# Patient Record
Sex: Female | Born: 1970
Health system: Southern US, Community
[De-identification: ages and names within clinical notes are randomized; demographics above are authoritative.]

## PROBLEM LIST (undated history)

## (undated) DIAGNOSIS — R609 Edema, unspecified: Secondary | ICD-10-CM

## (undated) DIAGNOSIS — M549 Dorsalgia, unspecified: Secondary | ICD-10-CM

## (undated) DIAGNOSIS — M2550 Pain in unspecified joint: Secondary | ICD-10-CM

## (undated) DIAGNOSIS — M779 Enthesopathy, unspecified: Secondary | ICD-10-CM

## (undated) DIAGNOSIS — E7800 Pure hypercholesterolemia, unspecified: Secondary | ICD-10-CM

## (undated) DIAGNOSIS — Z8739 Personal history of other diseases of the musculoskeletal system and connective tissue: Secondary | ICD-10-CM

## (undated) DIAGNOSIS — R7303 Prediabetes: Secondary | ICD-10-CM

## (undated) DIAGNOSIS — T7840XA Allergy, unspecified, initial encounter: Secondary | ICD-10-CM

## (undated) DIAGNOSIS — M1990 Unspecified osteoarthritis, unspecified site: Secondary | ICD-10-CM

## (undated) DIAGNOSIS — R928 Other abnormal and inconclusive findings on diagnostic imaging of breast: Secondary | ICD-10-CM

## (undated) DIAGNOSIS — I10 Essential (primary) hypertension: Secondary | ICD-10-CM

## (undated) DIAGNOSIS — H409 Unspecified glaucoma: Secondary | ICD-10-CM

## (undated) DIAGNOSIS — T8859XA Other complications of anesthesia, initial encounter: Secondary | ICD-10-CM

## (undated) DIAGNOSIS — R12 Heartburn: Secondary | ICD-10-CM

## (undated) DIAGNOSIS — I499 Cardiac arrhythmia, unspecified: Secondary | ICD-10-CM

## (undated) DIAGNOSIS — K219 Gastro-esophageal reflux disease without esophagitis: Secondary | ICD-10-CM

## (undated) DIAGNOSIS — G4730 Sleep apnea, unspecified: Secondary | ICD-10-CM

## (undated) HISTORY — PX: ABDOMINAL HYSTERECTOMY: SHX81

## (undated) HISTORY — DX: Prediabetes: R73.03

## (undated) HISTORY — DX: Allergy, unspecified, initial encounter: T78.40XA

## (undated) HISTORY — DX: Pain in unspecified joint: M25.50

## (undated) HISTORY — DX: Other abnormal and inconclusive findings on diagnostic imaging of breast: R92.8

## (undated) HISTORY — DX: Edema, unspecified: R60.9

## (undated) HISTORY — PX: CARPAL TUNNEL RELEASE: SHX101

## (undated) HISTORY — PX: CYST REMOVAL TRUNK: SHX6283

## (undated) HISTORY — PX: ECTOPIC PREGNANCY SURGERY: SHX613

## (undated) HISTORY — DX: Gastro-esophageal reflux disease without esophagitis: K21.9

## (undated) HISTORY — DX: Dorsalgia, unspecified: M54.9

## (undated) HISTORY — DX: Personal history of other diseases of the musculoskeletal system and connective tissue: Z87.39

## (undated) HISTORY — DX: Heartburn: R12

## (undated) HISTORY — DX: Unspecified glaucoma: H40.9

## (undated) HISTORY — DX: Sleep apnea, unspecified: G47.30

## (undated) HISTORY — DX: Unspecified osteoarthritis, unspecified site: M19.90

## (undated) HISTORY — PX: TOTAL ABDOMINAL HYSTERECTOMY: SHX209

## (undated) HISTORY — DX: Pure hypercholesterolemia, unspecified: E78.00

## (undated) HISTORY — PX: WISDOM TOOTH EXTRACTION: SHX21

---

## 2011-09-19 DIAGNOSIS — R600 Localized edema: Secondary | ICD-10-CM | POA: Insufficient documentation

## 2012-01-12 DIAGNOSIS — D219 Benign neoplasm of connective and other soft tissue, unspecified: Secondary | ICD-10-CM | POA: Insufficient documentation

## 2015-12-18 DIAGNOSIS — G43009 Migraine without aura, not intractable, without status migrainosus: Secondary | ICD-10-CM | POA: Insufficient documentation

## 2016-10-15 DIAGNOSIS — N6310 Unspecified lump in the right breast, unspecified quadrant: Secondary | ICD-10-CM | POA: Insufficient documentation

## 2018-06-06 DIAGNOSIS — M2391 Unspecified internal derangement of right knee: Secondary | ICD-10-CM | POA: Insufficient documentation

## 2018-07-19 DIAGNOSIS — D235 Other benign neoplasm of skin of trunk: Secondary | ICD-10-CM | POA: Insufficient documentation

## 2019-10-29 ENCOUNTER — Emergency Department
Admission: EM | Admit: 2019-10-29 | Discharge: 2019-10-29 | Disposition: A | Discharge status: Home / Self Care | Payer: No Typology Code available for payment source | Source: Home / Self Care | Attending: Family Medicine | Admitting: Family Medicine

## 2019-10-29 ENCOUNTER — Other Ambulatory Visit: Payer: Self-pay

## 2019-10-29 ENCOUNTER — Ambulatory Visit: Payer: Self-pay | Admitting: Family Medicine

## 2019-10-29 DIAGNOSIS — I10 Essential (primary) hypertension: Secondary | ICD-10-CM

## 2019-10-29 HISTORY — DX: Enthesopathy, unspecified: M77.9

## 2019-10-29 HISTORY — DX: Essential (primary) hypertension: I10

## 2019-10-29 LAB — POCT CBC W AUTO DIFF (K'VILLE URGENT CARE)

## 2019-10-29 LAB — POCT URINALYSIS DIP (MANUAL ENTRY)
Bilirubin, UA: NEGATIVE
Blood, UA: NEGATIVE
Glucose, UA: NEGATIVE mg/dL
Ketones, POC UA: NEGATIVE mg/dL
Leukocytes, UA: NEGATIVE
Nitrite, UA: NEGATIVE
Spec Grav, UA: >=1.03 — AB (ref 1.010–1.025)
Urobilinogen, UA: 0.2 E.U./dL
pH, UA: 6 (ref 5.0–8.0)

## 2019-10-29 MED ORDER — LOSARTAN POTASSIUM-HCTZ 100-25 MG PO TABS: 1.0000 | ORAL_TABLET | Freq: Every day | ORAL | 1 refills | Status: DC

## 2019-10-29 NOTE — ED Triage Notes (Signed)
Patient presents to Urgent Care with complaints of needing her blood pressure checked and needing to be evaluated for potential need for BP medication since she was 10 minutes late for her PCP appointment and she was only allowed a 9 minute grace period. Patient reports she takes two BP medications already, she does feel like her BP is high, last home reading was 156/98.

## 2019-10-29 NOTE — ED Provider Notes (Signed)
[facility]    CSN: [id number] Arrival date & time: 10/29/19  1116      History   Chief Complaint Chief Complaint  Patient presents with  . Hypertension    HPI Amy Diaz is a 49 y.o. female.   Patient states that she has hypertension, but has run out of her medication, and arrived to her PCP followup appointment too late to be seen.  She states that she has been taking Hyzaar on alternate days because she does not have enough tabs, but has plenty of Toprol which she continues daily.  She reports that her BP had been controlled when she took both meds.  She reports no adverse effects from her meds.  The history is provided by the patient.    Past Medical History:  Diagnosis Date  . Enthesopathy   . Hypertension     There are no problems to display for this patient.   History reviewed. No pertinent surgical history.  OB History   No obstetric history on file.      Home Medications    Prior to Admission medications   Medication Sig Start Date End Date Taking? Authorizing Provider  metoprolol succinate (TOPROL-XL) 25 MG 24 hr tablet Take 25 mg by mouth daily.   Yes [provider]  losartan-hydrochlorothiazide (HYZAAR) 100-25 MG tablet Take 1 tablet by mouth daily. 10/29/19   Lattie Haw, MD    Family History Family History  Problem Relation Age of Onset  . Hypertension Mother   . Hypertension Father   . Cancer Father     Social History Social History   Tobacco Use  . Smoking status: Never Smoker  . Smokeless tobacco: Never Used  Substance Use Topics  . Alcohol use: Not Currently  . Drug use: Not on file     Allergies   Imitrex [sumatriptan] and Lisinopril   Review of Systems Review of Systems  Constitutional: Negative for appetite change, chills, diaphoresis, fatigue and fever.  HENT: Negative.   Eyes: Negative.   Respiratory: Negative.   Cardiovascular: Negative for chest pain, palpitations and leg swelling.    Gastrointestinal: Negative.   Genitourinary: Negative.   Musculoskeletal: Negative.   Skin: Negative.   Neurological: Negative.      Physical Exam Triage Vital Signs ED Triage Vitals [10/29/19 1142]  Enc Vitals Group     BP (!) 174/94     Pulse Rate 69     Resp 16     Temp 98.4 F (36.9 C)     Temp Source Oral     SpO2 99 %     Weight      Height      Head Circumference      Peak Flow      Pain Score      Pain Loc      Pain Edu?      Excl. in GC?    No data found.  Updated Vital Signs BP (!) 174/94 (BP Location: Right Arm)   Pulse 69   Temp 98.4 F (36.9 C) (Oral)   Resp 16   SpO2 99%   Visual Acuity Right Eye Distance:   Left Eye Distance:   Bilateral Distance:    Right Eye Near:   Left Eye Near:    Bilateral Near:     Physical Exam Nursing notes and Vital Signs reviewed. Appearance:  Patient appears stated age, and in no acute distress.    Eyes:  Pupils are equal,  round, and reactive to light and accomodation.  Extraocular movement is intact.  Conjunctivae are not inflamed.  Fundi benign Pharynx:  Normal; moist mucous membranes  Neck:  Supple.  No adenopathy or thyromegaly.  Carotids have normal upstrokes without bruits. Lungs:  Clear to auscultation.  Breath sounds are equal.  Moving air well. Heart:  Regular rate and rhythm without murmurs, rubs, or gallops.  Abdomen:  Nontender without masses or hepatosplenomegaly.  Bowel sounds are present.  No CVA or flank tenderness.  Extremities:  No edema.  Skin:  No rash present.     UC Treatments / Results  Labs (all labs ordered are listed, but only abnormal results are displayed) Labs Reviewed  COMPLETE METABOLIC PANEL WITH GFR  LIPID PANEL  POCT CBC W AUTO DIFF ([facility])  POCT URINALYSIS DIP (MANUAL ENTRY)    EKG   Radiology No results found.  Procedures Procedures (including critical care time)  Medications Ordered in UC Medications - No data to display  Initial Impression  / Assessment and Plan / UC Course  I have reviewed the triage vital signs and the nursing notes.  Pertinent labs & imaging results that were available during my care of the patient were reviewed by me and considered in my medical decision making (see chart for details).    Resume daily Hyzaar.  Check CMP, CBC, U/A, lipid panel Followup with Family Doctor as scheduled.   Final Clinical Impressions(s) / UC Diagnoses   Final diagnoses:  Essential hypertension     Discharge Instructions     Continue Toprol-XL 25mg  as prescribed.  Please monitor your blood pressure several times weekly, at different times of the day, record on a calendar with times of measurement and take this record to your Family Doctor.     ED Prescriptions    Medication Sig Dispense Auth. Provider   losartan-hydrochlorothiazide (HYZAAR) 100-25 MG tablet Take 1 tablet by mouth daily. 15 tablet Kandra Nicolas, MD        Kandra Nicolas, MD 10/31/19 [phone]

## 2019-10-29 NOTE — Discharge Instructions (Addendum)
Continue Toprol-XL 25mg  as prescribed.  Please monitor your blood pressure several times weekly, at different times of the day, record on a calendar with times of measurement and take this record to your Family Doctor.

## 2019-10-30 LAB — HEMOGLOBIN A1C
Hgb A1c MFr Bld: 5.8 % of total Hgb — ABNORMAL HIGH (ref <5.7)
Mean Plasma Glucose: 120 (calc)
eAG (mmol/L): 6.6 (calc)

## 2019-10-30 LAB — EXTRA SPECIMEN

## 2019-11-03 ENCOUNTER — Other Ambulatory Visit: Payer: Self-pay

## 2019-11-03 ENCOUNTER — Encounter: Payer: Self-pay | Admitting: Medical-Surgical

## 2019-11-03 ENCOUNTER — Other Ambulatory Visit (HOSPITAL_COMMUNITY): Payer: Self-pay | Admitting: Medical-Surgical

## 2019-11-03 ENCOUNTER — Ambulatory Visit (INDEPENDENT_AMBULATORY_CARE_PROVIDER_SITE_OTHER): Payer: No Typology Code available for payment source | Admitting: Medical-Surgical

## 2019-11-03 VITALS — BP 146/78 | HR 66 | Temp 97.8°F | Ht 63.0 in | Wt 247.4 lb

## 2019-11-03 DIAGNOSIS — I10 Essential (primary) hypertension: Secondary | ICD-10-CM | POA: Insufficient documentation

## 2019-11-03 DIAGNOSIS — Z1231 Encounter for screening mammogram for malignant neoplasm of breast: Secondary | ICD-10-CM | POA: Diagnosis not present

## 2019-11-03 DIAGNOSIS — R635 Abnormal weight gain: Secondary | ICD-10-CM

## 2019-11-03 DIAGNOSIS — G4700 Insomnia, unspecified: Secondary | ICD-10-CM

## 2019-11-03 DIAGNOSIS — W19XXXS Unspecified fall, sequela: Secondary | ICD-10-CM

## 2019-11-03 DIAGNOSIS — W19XXXA Unspecified fall, initial encounter: Secondary | ICD-10-CM | POA: Insufficient documentation

## 2019-11-03 DIAGNOSIS — R7303 Prediabetes: Secondary | ICD-10-CM

## 2019-11-03 MED ORDER — OZEMPIC (0.25 OR 0.5 MG/DOSE) 2 MG/1.5ML ~~LOC~~ SOPN: PEN_INJECTOR | SUBCUTANEOUS | 1 refills | Status: DC

## 2019-11-03 MED ORDER — LOSARTAN POTASSIUM-HCTZ 100-25 MG PO TABS: 1.0000 | ORAL_TABLET | Freq: Every day | ORAL | 3 refills | Status: DC

## 2019-11-03 MED ORDER — TRAZODONE HCL 50 MG PO TABS: 25.0000 mg | ORAL_TABLET | Freq: Every evening | ORAL | 3 refills | Status: DC | PRN

## 2019-11-03 MED ORDER — METOPROLOL SUCCINATE ER 25 MG PO TB24: 25.0000 mg | ORAL_TABLET | Freq: Every day | ORAL | 3 refills | Status: DC

## 2019-11-03 MED FILL — traZODone HCL 50 MG TABS: 50 | 30 days supply | Qty: 30 | Fill #0

## 2019-11-03 MED FILL — METOPROLOL SUCCINATE ER 25: 25 | 90 days supply | Qty: 90 | Fill #0

## 2019-11-03 NOTE — Progress Notes (Signed)
New Patient Office Visit  Subjective:  Patient ID: Amy Diaz, female    DOB: 11/10/1970  Age: 49 y.o. MRN: [id number]  CC:  Chief Complaint  Patient presents with  . Establish Care  . Hypertension  . Weight Gain  . Fall    HPI Amy Diaz presents to establish care.  Would like to discuss hypertension, weight gain, and recent falls.   Hypertension-does check blood pressures at home but recently ran out of her medication.  Was seen at urgent care and provided 15-day refills to allow her to establish care with a new provider.  Readings at home have been 140-174/80s over the past week.  Reports still having small amounts of sodium to her food but trying to be careful about what she eats.  Not always checking food packages to evaluate sodium content.  Taking losartan-HCTZ 100-25 mg and Toprol-XL 25 mg daily.  Tolerating medications well without side effects.  Denies chest pain, palpitations, shortness of breath, lower extremity edema.  Endorses occasional headaches, especially when her blood pressure is elevated.  Weight gain-reports in November 2020 she was 225 pounds.  Has been previously seen by weight management and did phentermine with Topamax.  Her insurance did not cover the combination pill and she reports that the 2 medications separately did not seem to have the same effect.  She had successfully lost to 207 pounds but has gained some back over the year.  Today she is weighing in at 247 pounds and would like to get started on weight loss medications again.  She is reported some shortness of breath on exertion and admits that she is not exercising.  Falls-reports that she has had approximately 4 falls in the last 12 months, usually related to inclement weather.  These falls have left her with bilateral knee pain and lower back pain.  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  Taking Aleve  as needed which does provide some relief.  Insomnia-reports significant difficulty for many years with staying asleep at night.  Averages approximately 4 hours of sleep, nonrestorative.  Falls asleep without difficulty but when she wakes has trouble getting back to sleep.  Has tried Lunesta, melatonin, OTC sleep aids without relief.  Breast cancer screening-history of abnormal mammogram with 2 clips present from previous biopsies.  Overdue for next mammogram.  Would like to have that ordered today. Past Medical History:  Diagnosis Date  . Abnormal mammogram   . Enthesopathy   . H/O degenerative disc disease   . Hypertension     Past Surgical History:  Procedure Laterality Date  . ABDOMINAL HYSTERECTOMY    . CARPAL TUNNEL RELEASE    . CESAREAN SECTION    . CYST REMOVAL TRUNK    . ECTOPIC PREGNANCY SURGERY      Family History  Problem Relation Age of Onset  . Hypertension Mother   . Hypertension Father   . Prostate cancer Father     Social History   Socioeconomic History  . Marital status: Married    Spouse name: Not on file  . Number of children: Not on file  . Years of education: Not on file  . Highest education level: Not on file  Occupational History  . Not on file  Tobacco Use  . Smoking status: Never Smoker  . Smokeless tobacco: Never Used  Substance and Sexual Activity  . Alcohol use: Yes  Comment: 1 drink/month  . Drug use: Never  . Sexual activity: Yes    Birth control/protection: Surgical  Other Topics Concern  . Not on file  Social History Narrative  . Not on file   Social Determinants of Health   Financial Resource Strain:   . Difficulty of Paying Living Expenses:   Food Insecurity:   . Worried About Charity fundraiser in the Last Year:   . Arboriculturist in the Last Year:   Transportation Needs:   . Film/video editor (Medical):   Marland Kitchen Lack of Transportation (Non-Medical):   Physical Activity:   . Days of Exercise per Week:   . Minutes  of Exercise per Session:   Stress:   . Feeling of Stress :   Social Connections:   . Frequency of Communication with Friends and Family:   . Frequency of Social Gatherings with Friends and Family:   . Attends Religious Services:   . Active Member of Clubs or Organizations:   . Attends Archivist Meetings:   Marland Kitchen Marital Status:   Intimate Partner Violence:   . Fear of Current or Ex-Partner:   . Emotionally Abused:   Marland Kitchen Physically Abused:   . Sexually Abused:     ROS Review of Systems  Constitutional: Negative for chills, fatigue and fever.  Eyes: Negative for visual disturbance.  Respiratory: Positive for shortness of breath (With exertion). Negative for cough and chest tightness.   Cardiovascular: Negative for chest pain, palpitations and leg swelling.  Gastrointestinal: Negative for abdominal pain, blood in stool, constipation and diarrhea.  Musculoskeletal: Positive for arthralgias (Bilateral knees) and back pain.  Neurological: Positive for headaches. Negative for dizziness and light-headedness.  Psychiatric/Behavioral: Positive for sleep disturbance.    Objective:   Today's Vitals: BP (!) 146/78   Pulse 66   Temp 97.8 F (36.6 C) (Oral)   Ht 5\' 3"  (1.6 m)   Wt 247 lb 6.4 oz (112.2 kg)   SpO2 100%   BMI 43.82 kg/m   Physical Exam Vitals reviewed.  Constitutional:      General: She is not in acute distress.    Appearance: Normal appearance.  HENT:     Head: Normocephalic and atraumatic.  Cardiovascular:     Rate and Rhythm: Normal rate and regular rhythm.     Pulses: Normal pulses.     Heart sounds: Normal heart sounds. No murmur. No friction rub. No gallop.   Pulmonary:     Effort: Pulmonary effort is normal. No respiratory distress.     Breath sounds: Normal breath sounds. No wheezing.  Skin:    General: Skin is warm and dry.  Neurological:     Mental Status: She is alert and oriented to person, place, and time.  Psychiatric:        Mood and  Affect: Mood normal.        Behavior: Behavior normal.        Thought Content: Thought content normal.        Judgment: Judgment normal.    Reviewed available labs from 10/29/2019. Assessment & Plan:   1. Essential hypertension Blood pressure in office today 146/78.  Continue losartan-HCTZ and Toprol-XL as prescribed.  Refills provided.  Discussed lifestyle modifications including increasing exercise, weight loss, and sodium restriction.  Continue checking blood pressures at home with a goal of 130/80 or less. - losartan-hydrochlorothiazide (HYZAAR) 100-25 MG tablet; Take 1 tablet by mouth daily.  Dispense: 90 tablet; Refill: 3 -  metoprolol succinate (TOPROL-XL) 25 MG 24 hr tablet; Take 1 tablet (25 mg total) by mouth daily.  Dispense: 90 tablet; Refill: 3  2. Fall, sequela Continue Aleve as needed for back and knee pain.  Discussed physical therapy, patient would like to hold off on this for now.  May follow-up with Dr. Karie Schwalbe for intervention as desired.  3. Insomnia, unspecified type Discussed over-the-counter and prescription options for insomnia.  Patient hesitant to try new medication but agreed to try trazodone 25 to 50 mg at bedtime as needed for sleep.  Advised patient to let me know if 50 mg did not help, may benefit from an increased dose. - traZODone (DESYREL) 50 MG tablet; Take 0.5-1 tablets (25-50 mg total) by mouth at bedtime as needed for sleep.  Dispense: 30 tablet; Refill: 3  4. Encounter for screening mammogram for malignant neoplasm of breast Diagnostic mammogram ordered. - MM Digital Diagnostic Bilat; Future  5. Weight gain Discussed available options for weight management.  We will try Ozempic as she is also prediabetic.  We will start at 0.25 mg injections weekly x2 weeks then increase to 0.5 mg weekly.  Discussed importance of lifestyle modifications to maintain weight loss. - Semaglutide,0.25 or 0.5MG /DOS, (OZEMPIC, 0.25 OR 0.5 MG/DOSE,) 2 MG/1.5ML SOPN; Inject 0.25 mg  into the skin once a week for 14 days, THEN 0.5 mg once a week.  Dispense: 3 pen; Refill: 1  6. Prediabetes Recommend lifestyle modifications, avoidance of concentrated sugars and simple carbohydrates, and increased exercise.  Starting Ozempic for weight loss which should also help with glucose control. - Semaglutide,0.25 or 0.5MG /DOS, (OZEMPIC, 0.25 OR 0.5 MG/DOSE,) 2 MG/1.5ML SOPN; Inject 0.25 mg into the skin once a week for 14 days, THEN 0.5 mg once a week.  Dispense: 3 pen; Refill: 1   Outpatient Encounter Medications as of 11/03/2019  Medication Sig  . losartan-hydrochlorothiazide (HYZAAR) 100-25 MG tablet Take 1 tablet by mouth daily.  . metoprolol succinate (TOPROL-XL) 25 MG 24 hr tablet Take 1 tablet (25 mg total) by mouth daily.  . MULTIPLE VITAMIN PO Take 1 tablet by mouth daily.  . naproxen sodium (ALEVE) 220 MG tablet Take 220 mg by mouth 2 (two) times daily as needed.  . [DISCONTINUED] losartan-hydrochlorothiazide (HYZAAR) 100-25 MG tablet Take 1 tablet by mouth daily.  . [DISCONTINUED] metoprolol succinate (TOPROL-XL) 25 MG 24 hr tablet Take 25 mg by mouth daily.  . Semaglutide,0.25 or 0.5MG /DOS, (OZEMPIC, 0.25 OR 0.5 MG/DOSE,) 2 MG/1.5ML SOPN Inject 0.25 mg into the skin once a week for 14 days, THEN 0.5 mg once a week.  . traZODone (DESYREL) 50 MG tablet Take 0.5-1 tablets (25-50 mg total) by mouth at bedtime as needed for sleep.   No facility-administered encounter medications on file as of 11/03/2019.    Follow-up: Return in about 3 months (around 02/02/2020) for weight management.   Christen Butter, NP

## 2019-11-04 ENCOUNTER — Telehealth: Payer: Self-pay | Admitting: Medical-Surgical

## 2019-11-04 MED FILL — OZEMPIC 0.25 OR 0.5 MG/DOSE: 2 | 28 days supply | Qty: 2 | Fill #0

## 2019-11-04 NOTE — Telephone Encounter (Signed)
Received fax for PA on Ozempic sent through cover my meds waiting on determination. - CF °

## 2019-11-10 MED FILL — LOSARTAN-HCTZ 100-25 MG TAB: 100-25 | 90 days supply | Qty: 90 | Fill #0

## 2019-11-25 NOTE — Telephone Encounter (Signed)
Received fax for PA on Ozempic they denied coverage due to quantity limits placing in providers box for review. - CF

## 2019-11-26 ENCOUNTER — Telehealth: Payer: Self-pay

## 2019-11-26 DIAGNOSIS — R635 Abnormal weight gain: Secondary | ICD-10-CM

## 2019-11-26 MED ORDER — PHENTERMINE HCL 37.5 MG PO CAPS: ORAL_CAPSULE | ORAL | 0 refills | Status: DC

## 2019-11-26 NOTE — Telephone Encounter (Signed)
Pt was started on Ozempic on 10/31/2019 for weight loss. Pt states her insurance company will only pay for a #30 day supply at a time. She picked up the initial #30 day supply and has been using the Rx as sig'd. Pt states that she has not seen a change in her weight so instead of getting a refill of Ozempic she would like to get a Rx for phentermine. Pt states she has taken phentermine in the past with benefit.

## 2019-11-27 NOTE — Telephone Encounter (Signed)
LVMTRC (1st attempt)   

## 2019-11-28 NOTE — Telephone Encounter (Signed)
Pt aware Rx has been sent to the pharmacy and to stop Ozempic. Pt agreeable to scheduling a f/up OV in 4 weeks. Pt verbalized understanding regarding stopping Ozempic. Call transferred to the front desk for scheduling.

## 2019-12-26 ENCOUNTER — Encounter: Payer: Self-pay | Admitting: Medical-Surgical

## 2019-12-26 ENCOUNTER — Ambulatory Visit (INDEPENDENT_AMBULATORY_CARE_PROVIDER_SITE_OTHER): Payer: No Typology Code available for payment source | Admitting: Medical-Surgical

## 2019-12-26 VITALS — BP 135/87 | HR 73 | Temp 98.2°F | Ht 63.0 in | Wt 236.3 lb

## 2019-12-26 DIAGNOSIS — Z114 Encounter for screening for human immunodeficiency virus [HIV]: Secondary | ICD-10-CM | POA: Diagnosis not present

## 2019-12-26 DIAGNOSIS — R635 Abnormal weight gain: Secondary | ICD-10-CM

## 2019-12-26 DIAGNOSIS — L219 Seborrheic dermatitis, unspecified: Secondary | ICD-10-CM

## 2019-12-26 MED ORDER — PHENTERMINE HCL 37.5 MG PO CAPS: ORAL_CAPSULE | ORAL | 0 refills | Status: DC

## 2019-12-26 MED ORDER — FLUOCINOLONE ACETONIDE BODY 0.01 % EX OIL: 1.0000 "application " | TOPICAL_OIL | Freq: Every day | CUTANEOUS | 2 refills | Status: DC

## 2019-12-26 MED FILL — PHENTERMINE 37.5 MG CAPSULE: 37.5 | 30 days supply | Qty: 30 | Fill #0

## 2019-12-26 MED FILL — FLUOCINOLONE ACETONIDE BODY: 0.01 | 28 days supply | Qty: 118 | Fill #0

## 2019-12-26 NOTE — Progress Notes (Signed)
Subjective:    CC: Weight check  HPI: Pleasant 49 year old female presenting today for weight check on phentermine.  Started phentermine 1 month ago and has been tolerating the medication well without side effects.  Endorses being very active and walking a lot at work but has also started walking some outside of work.  Has cut out sweets and is no longer eating cookies.  Has opted for fresh fruits and vegetables instead.  Avoiding canned vegetables.  Has cut back on sodium intake.  Reports a notable decrease in appetite.  Admits that she has difficulty drinking water and often does not drink enough.  Also reports she has stopped drinking coffee as she tends to like a lot of cream and sugar.  Denies chest pain, shortness of breath, lower extremity edema, palpitations, headaches, and GI concerns.  Reports she often gets seborrheic dermatitis on her eyebrows and chin.  She has had this problem for years and was previously prescribed fluocinolone oil which was very helpful.  Is requesting a prescription for that medication today.  I reviewed the past medical history, family history, social history, surgical history, and allergies today and no changes were needed.  Please see the problem list section below in epic for further details.  Past Medical History: Past Medical History:  Diagnosis Date  . Abnormal mammogram   . Enthesopathy   . H/O degenerative disc disease   . Hypertension    Past Surgical History: Past Surgical History:  Procedure Laterality Date  . ABDOMINAL HYSTERECTOMY    . CARPAL TUNNEL RELEASE    . CESAREAN SECTION    . CYST REMOVAL TRUNK    . ECTOPIC PREGNANCY SURGERY     Social History: Social History   Socioeconomic History  . Marital status: Married    Spouse name: Not on file  . Number of children: Not on file  . Years of education: Not on file  . Highest education level: Not on file  Occupational History  . Not on file  Tobacco Use  . Smoking status: Never  Smoker  . Smokeless tobacco: Never Used  Substance and Sexual Activity  . Alcohol use: Yes    Comment: 1 drink/month  . Drug use: Never  . Sexual activity: Yes    Birth control/protection: Surgical  Other Topics Concern  . Not on file  Social History Narrative  . Not on file   Social Determinants of Health   Financial Resource Strain:   . Difficulty of Paying Living Expenses:   Food Insecurity:   . Worried About Charity fundraiser in the Last Year:   . Arboriculturist in the Last Year:   Transportation Needs:   . Film/video editor (Medical):   Marland Kitchen Lack of Transportation (Non-Medical):   Physical Activity:   . Days of Exercise per Week:   . Minutes of Exercise per Session:   Stress:   . Feeling of Stress :   Social Connections:   . Frequency of Communication with Friends and Family:   . Frequency of Social Gatherings with Friends and Family:   . Attends Religious Services:   . Active Member of Clubs or Organizations:   . Attends Archivist Meetings:   Marland Kitchen Marital Status:    Family History: Family History  Problem Relation Age of Onset  . Hypertension Mother   . Hypertension Father   . Prostate cancer Father    Allergies: Allergies  Allergen Reactions  . Imitrex [Sumatriptan]  Causes chest tightness  . Lisinopril Cough   Medications: See med rec.  Review of Systems: No fevers, chills, night sweats, weight loss, chest pain, or shortness of breath.   Objective:    General: Well Developed, well nourished, and in no acute distress.  Neuro: Alert and oriented x3.  HEENT: Normocephalic, atraumatic.  Skin: Warm and dry. Cardiac: Regular rate and rhythm, no murmurs rubs or gallops, no lower extremity edema.  Respiratory: Clear to auscultation bilaterally. Not using accessory muscles, speaking in full sentences.   Impression and Recommendations:    1. Weight gain 11 lb weight loss in the past 4 weeks. She is doing amazing with lifestyle changes  and dietary intake. Had questions about intermittent fasting that were answered. Recommend researching intermittent fasting using reputable resources before deciding if this is something she would like to try. Continue phentermine 37.5mg  daily. Increase weight bearing exercise and water intake.  - phentermine 37.5 MG capsule; One capsule by mouth qAM  Dispense: 30 capsule; Refill: 0  2. Seborrheic dermatitis Fluocinolone acetonide 0.01% oil to affected areas daily as needed.  3. Screening for HIV (human immunodeficiency virus Due for recommended HIV screening. Lab ordered to be drawn with next lab work. - HIV Antibody (routine testing w rflx)  Return in about 4 weeks (around 01/23/2020) for weight check. ___________________________________________ Thayer Ohm, DNP, APRN, FNP-BC Primary Care and Sports Medicine [facility] [place]

## 2020-01-15 ENCOUNTER — Ambulatory Visit (INDEPENDENT_AMBULATORY_CARE_PROVIDER_SITE_OTHER): Payer: No Typology Code available for payment source | Admitting: Medical-Surgical

## 2020-01-15 ENCOUNTER — Other Ambulatory Visit: Payer: Self-pay

## 2020-01-15 ENCOUNTER — Other Ambulatory Visit: Payer: Self-pay | Admitting: Medical-Surgical

## 2020-01-15 ENCOUNTER — Encounter: Payer: Self-pay | Admitting: Medical-Surgical

## 2020-01-15 VITALS — BP 139/88 | HR 71 | Temp 97.5°F | Ht 63.0 in | Wt 239.4 lb

## 2020-01-15 DIAGNOSIS — R635 Abnormal weight gain: Secondary | ICD-10-CM

## 2020-01-15 DIAGNOSIS — Z1231 Encounter for screening mammogram for malignant neoplasm of breast: Secondary | ICD-10-CM

## 2020-01-15 DIAGNOSIS — B349 Viral infection, unspecified: Secondary | ICD-10-CM | POA: Diagnosis not present

## 2020-01-15 MED ORDER — SAXENDA 18 MG/3ML ~~LOC~~ SOPN: 3.0000 mg | PEN_INJECTOR | Freq: Every day | SUBCUTANEOUS | 0 refills | Status: DC

## 2020-01-15 NOTE — Progress Notes (Signed)
Subjective:    CC: Weight check  HPI: Very pleasant 49 year old female presenting today for weight check on phentermine 37.5 mg daily.  Taking the medication without any side effects.  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  Not only has the move been stressful, they did not have the necessary resources to see patients when they open their doors for appointments.  She has been working long hours and not eating regularly.  Yesterday she reports only eating a small salad and approximately 2 spoons full of tuna.  She has not been exercising.  Denies chest pain, palpitations, lower extremity edema, shortness of breath, headaches, and dizziness.  Reports she had a cold at the beginning of the week and is still suffering with sore throat and significant sinus drainage.  She also notes that she has been sneezing quite a bit.  Denies fever, chills, productive cough, and GI symptoms.  Noticed she and her husband have both been sick since they were around their sick grandson at the end of the week.  I reviewed the past medical history, family history, social history, surgical history, and allergies today and no changes were needed.  Please see the problem list section below in epic for further details.  Past Medical History: Past Medical History:  Diagnosis Date  . Abnormal mammogram   . Enthesopathy   . H/O degenerative disc disease   . Hypertension    Past Surgical History: Past Surgical History:  Procedure Laterality Date  . ABDOMINAL HYSTERECTOMY    . CARPAL TUNNEL RELEASE    . CESAREAN SECTION    . CYST REMOVAL TRUNK    . ECTOPIC PREGNANCY SURGERY     Social History: Social History   Socioeconomic History  . Marital status: Married    Spouse name: Not on file  . Number of children: Not on file  . Years of education: Not on file  . Highest education level:  Not on file  Occupational History  . Not on file  Tobacco Use  . Smoking status: Never Smoker  . Smokeless tobacco: Never Used  Vaping Use  . Vaping Use: Never used  Substance and Sexual Activity  . Alcohol use: Yes    Comment: 1 drink/month  . Drug use: Never  . Sexual activity: Yes    Birth control/protection: Surgical  Other Topics Concern  . Not on file  Social History Narrative  . Not on file   Social Determinants of Health   Financial Resource Strain:   . Difficulty of Paying Living Expenses:   Food Insecurity:   . Worried About Programme researcher, broadcasting/film/video in the Last Year:   . Barista in the Last Year:   Transportation Needs:   . Freight forwarder (Medical):   Marland Kitchen Lack of Transportation (Non-Medical):   Physical Activity:   . Days of Exercise per Week:   . Minutes of Exercise per Session:   Stress:   . Feeling of Stress :   Social Connections:   . Frequency of Communication with Friends and Family:   . Frequency of Social Gatherings with Friends and Family:   . Attends Religious Services:   . Active Member of Clubs or Organizations:   . Attends Banker Meetings:   Marland Kitchen Marital Status:    Family History: Family History  Problem Relation Age of Onset  .  Hypertension Mother   . Hypertension Father   . Prostate cancer Father    Allergies: Allergies  Allergen Reactions  . Imitrex [Sumatriptan]     Causes chest tightness  . Lisinopril Cough   Medications: See med rec.  Review of Systems: See HPI for pertinent positives and negatives.   Objective:    General: Well Developed, well nourished, and in no acute distress.  Neuro: Alert and oriented x3.  HEENT: Normocephalic, atraumatic.  Skin: Warm and dry. Cardiac: Regular rate and rhythm, no murmurs rubs or gallops, no lower extremity edema.  Respiratory: Clear to auscultation bilaterally. Not using accessory muscles, speaking in full sentences.   Impression and Recommendations:    1.  Weight gain Discussed weight loss expectations while taking phentermine.  I do recommend that she increase her activity.  From what it sounds like she is not eating enough calories in a day and this may be affecting her results on phentermine.  Recommend aiming for approximately 1200 cal/day while avoiding concentrated sweets and simple carbohydrates but making sure she is getting plenty of protein.  Discussed options for protein bars/shakes and other quick snacks/meals that will be conducive to her busy schedule.  Given her prediabetic status, would like to see if we can get Saxenda covered by insurance and if so switch over to that.  Order placed for [place] today but prior Auth needed.  If no update or not improved in 1 week, we will go ahead and call in the refill of phentermine for another 4 weeks.  2.  Viral syndrome Suspect she is on the tail end of the virus.  Continue supportive care for sore throat.  With increased sneezing and nasal drainage, recommend starting a daily antihistamine such as Allegra, Zyrtec, Claritin, or Xyzal.  Discussed warm liquids, tea with honey, Tylenol/ibuprofen use, etc.  Return in about 4 weeks (around 02/12/2020) for weight check (subject to change). ___________________________________________ Clearnce Sorrel, DNP, APRN, FNP-BC Primary Care and Sports Medicine [place]

## 2020-01-22 ENCOUNTER — Other Ambulatory Visit: Payer: Self-pay | Admitting: Medical-Surgical

## 2020-01-22 DIAGNOSIS — R635 Abnormal weight gain: Secondary | ICD-10-CM

## 2020-01-22 MED ORDER — PHENTERMINE HCL 37.5 MG PO CAPS: ORAL_CAPSULE | ORAL | 0 refills | Status: DC

## 2020-01-26 ENCOUNTER — Telehealth: Payer: Self-pay | Admitting: Medical-Surgical

## 2020-01-26 ENCOUNTER — Telehealth: Payer: Self-pay

## 2020-01-26 NOTE — Telephone Encounter (Signed)
Received fax for PA on Saxenda sent through cover my meds waiting on determination. - CF 

## 2020-01-26 NOTE — Telephone Encounter (Signed)
Pt called and LVM stating that her pharmacy [facility] (?) told her the Saxenda needed a PA, but they will not take a paper PA, that the provider has to call. I called and spoke with pt who stated that she was in [place] [employer] last week and will be picking up the phentermine today to take while we work on the [place].

## 2020-01-26 NOTE — Telephone Encounter (Signed)
I touch base with the pharmacy on Thursday last week regarding the prior Auth for Saxenda for this patient.  Apparently they sent the paperwork to the wrong number.  I had them refax this.  Could you check into getting this pushed through so we can switch her from phentermine to Saxenda?  She has tried Ozempic already and was unhappy with her results during the short time she was on it.

## 2020-01-26 NOTE — Telephone Encounter (Signed)
I Received fax from the pharmacy this morning and sent through cover my meds waiting on determination from pharmacy - CF

## 2020-01-28 NOTE — Telephone Encounter (Signed)
Received fax from [employer] and they approved Saxenda from 01/28/20 - 05/28/20. Reference Number [id number]. - CF

## 2020-01-29 ENCOUNTER — Ambulatory Visit
Admission: RE | Admit: 2020-01-29 | Discharge: 2020-01-29 | Disposition: A | Discharge status: Home / Self Care | Payer: No Typology Code available for payment source | Source: Ambulatory Visit | Attending: Medical-Surgical | Admitting: Medical-Surgical

## 2020-01-29 ENCOUNTER — Other Ambulatory Visit: Payer: Self-pay

## 2020-01-29 DIAGNOSIS — Z1231 Encounter for screening mammogram for malignant neoplasm of breast: Secondary | ICD-10-CM

## 2020-02-03 ENCOUNTER — Ambulatory Visit: Payer: No Typology Code available for payment source | Admitting: Medical-Surgical

## 2020-02-12 ENCOUNTER — Ambulatory Visit: Payer: No Typology Code available for payment source | Admitting: Medical-Surgical

## 2020-02-13 ENCOUNTER — Other Ambulatory Visit: Payer: Self-pay

## 2020-02-13 ENCOUNTER — Ambulatory Visit (INDEPENDENT_AMBULATORY_CARE_PROVIDER_SITE_OTHER): Payer: No Typology Code available for payment source | Admitting: Medical-Surgical

## 2020-02-13 ENCOUNTER — Encounter: Payer: Self-pay | Admitting: Medical-Surgical

## 2020-02-13 VITALS — BP 124/84 | HR 81 | Temp 98.4°F | Ht 63.0 in | Wt 235.6 lb

## 2020-02-13 DIAGNOSIS — K0889 Other specified disorders of teeth and supporting structures: Secondary | ICD-10-CM

## 2020-02-13 DIAGNOSIS — R635 Abnormal weight gain: Secondary | ICD-10-CM

## 2020-02-13 MED ORDER — INSULIN PEN NEEDLE 31G X 5 MM MISC: 1.0000 | Freq: Every day | 4 refills | Status: DC

## 2020-02-13 MED ORDER — HYDROCODONE-ACETAMINOPHEN 5-325 MG PO TABS: 1.0000 | ORAL_TABLET | Freq: Four times a day (QID) | ORAL | 0 refills | Status: DC | PRN

## 2020-02-13 MED ORDER — HYDROCODONE-ACETAMINOPHEN 5-325 MG PO TABS: 1.0000 | ORAL_TABLET | Freq: Four times a day (QID) | ORAL | 0 refills | Status: AC | PRN

## 2020-02-13 MED FILL — UNIFINE PENTIPS 31GX3/16: 31G X 5 MM | 90 days supply | Qty: 100 | Fill #0

## 2020-02-13 NOTE — Progress Notes (Signed)
Subjective:    CC: weight check  HPI: Pleasant 49 year old female presenting today for weight check.  Was previously taking phentermine for weight loss but had to be off of it for 2 weeks prior to her recent dental surgery.  We had planned to switch from phentermine to Saxenda as she has had poor response to phentermine.  She was able to pick up her Saxenda prescription on Wednesday and started taking it at that time.  Unfortunately, the Saxenda prescription did not include the needles for administration.  When she went to the pharmacy earlier, the pharmacist put an order in and provided her with 50 needles at a cost of $50.  Denies any pain, shortness of breath, palpitations, and headaches.  Had her wisdom teeth removed this past week.  Since then she has been having severe mouth pain.  Her oral surgeon advised her to take Tylenol and ibuprofen, alternating them for pain control.  She has been doing this as instructed with no relief.  She has been unable to eat for the past couple of days.  She did try some soft foods such as mashed potatoes but experienced too much pain when the food got toward the back of her mouth.  Denies fever, chills, and nausea.  I reviewed the past medical history, family history, social history, surgical history, and allergies today and no changes were needed.  Please see the problem list section below in epic for further details.  Past Medical History: Past Medical History:  Diagnosis Date  . Abnormal mammogram   . Enthesopathy   . H/O degenerative disc disease   . Hypertension    Past Surgical History: Past Surgical History:  Procedure Laterality Date  . ABDOMINAL HYSTERECTOMY    . CARPAL TUNNEL RELEASE    . CESAREAN SECTION    . CYST REMOVAL TRUNK    . ECTOPIC PREGNANCY SURGERY     Social History: Social History   Socioeconomic History  . Marital status: Married    Spouse name: Not on file  . Number of children: Not on file  . Years of education: Not  on file  . Highest education level: Not on file  Occupational History  . Not on file  Tobacco Use  . Smoking status: Never Smoker  . Smokeless tobacco: Never Used  Vaping Use  . Vaping Use: Never used  Substance and Sexual Activity  . Alcohol use: Yes    Comment: 1 drink/month  . Drug use: Never  . Sexual activity: Yes    Birth control/protection: Surgical  Other Topics Concern  . Not on file  Social History Narrative  . Not on file   Social Determinants of Health   Financial Resource Strain:   . Difficulty of Paying Living Expenses:   Food Insecurity:   . Worried About Programme researcher, broadcasting/film/video in the Last Year:   . Barista in the Last Year:   Transportation Needs:   . Freight forwarder (Medical):   Marland Kitchen Lack of Transportation (Non-Medical):   Physical Activity:   . Days of Exercise per Week:   . Minutes of Exercise per Session:   Stress:   . Feeling of Stress :   Social Connections:   . Frequency of Communication with Friends and Family:   . Frequency of Social Gatherings with Friends and Family:   . Attends Religious Services:   . Active Member of Clubs or Organizations:   . Attends Banker Meetings:   .  Marital Status:    Family History: Family History  Problem Relation Age of Onset  . Hypertension Mother   . Hypertension Father   . Prostate cancer Father    Allergies: Allergies  Allergen Reactions  . Imitrex [Sumatriptan]     Causes chest tightness  . Lisinopril Cough   Medications: See med rec.  Review of Systems: No fevers, chills, night sweats, weight loss, chest pain, or shortness of breath.   Objective:    General: Well Developed, well nourished, and in no acute distress.  Neuro: Alert and oriented x3.  HEENT: Normocephalic, atraumatic. Bilateral jaw swelling from recent wisdom teeth removal. Skin: Warm and dry. Cardiac: Regular rate and rhythm, no murmurs rubs or gallops, no lower extremity edema.  Respiratory: Clear to  auscultation bilaterally. Not using accessory muscles, speaking in full sentences.   Impression and Recommendations:    1. Weight gain Continue Saxenda as prescribed. Prescription written for insulin needles for Saxenda administration that show as covered by insurance. Advised patient to check with pharmacy to see if these will work with her Saxenda pens.  If the new needles work and are cheaper, advised her to return the box she bought from the pharmacy.  2. Dental pain Tylenol/Ibuprofen not helping with pain and unable to eat due to high level of discomfort. Sending in Norco 1-2 tablets every 6 hours for pain. Try cool or warm compresses to see if this helps. Try to eat soft foods until pain improves.  Return in about 4 weeks (around 03/12/2020) for weight check. ___________________________________________ Thayer Ohm, DNP, APRN, FNP-BC Primary Care and Sports Medicine [facility]

## 2020-02-25 ENCOUNTER — Other Ambulatory Visit: Payer: Self-pay | Admitting: Medical-Surgical

## 2020-03-04 MED FILL — LOSARTAN-HCTZ 100-25 MG TAB: 100-25 | 90 days supply | Qty: 90 | Fill #0

## 2020-03-12 ENCOUNTER — Ambulatory Visit: Payer: No Typology Code available for payment source | Admitting: Medical-Surgical

## 2020-03-17 ENCOUNTER — Other Ambulatory Visit: Payer: Self-pay | Admitting: Medical-Surgical

## 2020-03-17 ENCOUNTER — Other Ambulatory Visit: Payer: Self-pay

## 2020-03-17 ENCOUNTER — Ambulatory Visit (INDEPENDENT_AMBULATORY_CARE_PROVIDER_SITE_OTHER): Payer: No Typology Code available for payment source | Admitting: Medical-Surgical

## 2020-03-17 ENCOUNTER — Encounter: Payer: Self-pay | Admitting: Medical-Surgical

## 2020-03-17 VITALS — BP 131/81 | HR 81 | Temp 98.3°F | Ht 63.0 in | Wt 234.6 lb

## 2020-03-17 DIAGNOSIS — R635 Abnormal weight gain: Secondary | ICD-10-CM | POA: Diagnosis not present

## 2020-03-17 DIAGNOSIS — K5900 Constipation, unspecified: Secondary | ICD-10-CM | POA: Diagnosis not present

## 2020-03-17 MED ORDER — SAXENDA 18 MG/3ML ~~LOC~~ SOPN: 3.0000 mg | PEN_INJECTOR | Freq: Every day | SUBCUTANEOUS | 2 refills | Status: DC

## 2020-03-17 MED FILL — SAXENDA 18 MG/3 ML PEN: 18 | 30 days supply | Qty: 15 | Fill #0

## 2020-03-17 NOTE — Progress Notes (Signed)
Subjective:    CC: Weight check  HPI: Pleasant 49 year old female presenting today for weight check on Saxenda.  She was previously taking phentermine but we switched to [name] last month.  She has been doing her daily injections, tolerating well without side effects.  Endorses moving up to her full 3 mg dose daily this week.  Notes the last month has been increasingly stressful.  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  Also notes that her best friend who is like a sister to her is in the hospital with COVID-19 pneumonia.  Has been making dietary changes but notes she is eating mostly fruit lately.  Yesterday she did have a piece of pizza for lunch with cantalope for breakfast.  She did not have any dinner and only drank 1.5 bottles of water all day.  Has been trying to walk more when possible. She has been experiencing constipation but is not taking any medications over-the-counter.  She has tried drinking apple juice which did not help much.  Notes that she was able to have a bowel movement over the weekend but had severe stomach cramps with diaphoresis, lightheadedness, and nausea.  Thinks she may have developed a small hemorrhoid related to her constipation.  I reviewed the past medical history, family history, social history, surgical history, and allergies today and no changes were needed.  Please see the problem list section below in epic for further details.  Past Medical History: Past Medical History:  Diagnosis Date  . Abnormal mammogram   . Enthesopathy   . H/O degenerative disc disease   . Hypertension    Past Surgical History: Past Surgical History:  Procedure Laterality Date  . ABDOMINAL HYSTERECTOMY    . CARPAL TUNNEL RELEASE    . CESAREAN SECTION    . CYST REMOVAL TRUNK    . ECTOPIC PREGNANCY SURGERY     Social History: Social History   Socioeconomic History  . Marital status: Married    Spouse name:  Not on file  . Number of children: Not on file  . Years of education: Not on file  . Highest education level: Not on file  Occupational History  . Not on file  Tobacco Use  . Smoking status: Never Smoker  . Smokeless tobacco: Never Used  Vaping Use  . Vaping Use: Never used  Substance and Sexual Activity  . Alcohol use: Yes    Comment: 1 drink/month  . Drug use: Never  . Sexual activity: Yes    Birth control/protection: Surgical  Other Topics Concern  . Not on file  Social History Narrative  . Not on file   Social Determinants of Health   Financial Resource Strain:   . Difficulty of Paying Living Expenses:   Food Insecurity:   . Worried About Programme researcher, broadcasting/film/video in the Last Year:   . Barista in the Last Year:   Transportation Needs:   . Freight forwarder (Medical):   Marland Kitchen Lack of Transportation (Non-Medical):   Physical Activity:   . Days of Exercise per Week:   . Minutes of Exercise per Session:   Stress:   . Feeling of Stress :   Social Connections:   . Frequency of Communication with Friends and Family:   . Frequency of Social Gatherings with Friends and Family:   . Attends Religious Services:   . Active Member of Clubs or Organizations:   .  Attends Banker Meetings:   Marland Kitchen Marital Status:    Family History: Family History  Problem Relation Age of Onset  . Hypertension Mother   . Hypertension Father   . Prostate cancer Father    Allergies: Allergies  Allergen Reactions  . Imitrex [Sumatriptan]     Causes chest tightness  . Lisinopril Cough   Medications: See med rec.  Review of Systems: See HPI for pertinent positives and negatives.   Objective:    General: Well Developed, well nourished, and in no acute distress.  Neuro: Alert and oriented x3, extra-ocular muscles intact, sensation grossly intact.  HEENT: Normocephalic, atraumatic, pupils equal round reactive to light, neck supple, no masses, no lymphadenopathy, thyroid  nonpalpable.  Skin: Warm and dry. Cardiac: Regular rate and rhythm, no murmurs rubs or gallops, no lower extremity edema.  Respiratory: Clear to auscultation bilaterally. Not using accessory muscles, speaking in full sentences.   Impression and Recommendations:    1. Weight gain Per our records, 4 pound weight gain since her last visit.  This was her first month of Saxenda daily so she is just now reaching maximum dose.  Advised to increase protein intake as well as increase her water intake.  Continue increased activities and work on [employer].  Continue Saxenda 3 mg daily.  Refills provided.   2. Constipation, unspecified constipation type Discussed constipation in the setting of dehydration and weight loss efforts.  Suspect her limited caloric intake is also playing a part.  Discussed over-the-counter stool softeners, MiraLAX, and fiber supplements.  Increase p.o. fluids to aim for at least 64 ounces per day.  Return in about 4 weeks (around 04/14/2020) for weight check.  ___________________________________________ Thayer Ohm, DNP, APRN, FNP-BC Primary Care and Sports Medicine [facility] [place]

## 2020-04-13 ENCOUNTER — Encounter: Payer: Self-pay | Admitting: Medical-Surgical

## 2020-04-13 ENCOUNTER — Ambulatory Visit (INDEPENDENT_AMBULATORY_CARE_PROVIDER_SITE_OTHER): Payer: No Typology Code available for payment source | Admitting: Medical-Surgical

## 2020-04-13 VITALS — BP 111/78 | HR 89 | Temp 98.1°F | Ht 63.0 in | Wt 228.1 lb

## 2020-04-13 DIAGNOSIS — R635 Abnormal weight gain: Secondary | ICD-10-CM | POA: Diagnosis not present

## 2020-04-13 DIAGNOSIS — K649 Unspecified hemorrhoids: Secondary | ICD-10-CM | POA: Diagnosis not present

## 2020-04-13 DIAGNOSIS — Z23 Encounter for immunization: Secondary | ICD-10-CM

## 2020-04-13 MED ORDER — HYDROCORTISONE ACETATE 25 MG RE SUPP: 25.0000 mg | Freq: Two times a day (BID) | RECTAL | 2 refills | Status: DC | PRN

## 2020-04-13 MED FILL — HYDROCORTISONE AC 25 MG SUP: 25 | 12 days supply | Qty: 24 | Fill #0

## 2020-04-13 NOTE — Progress Notes (Signed)
Subjective:    CC: weight check  HPI: Pleasant 49 year old female presenting for weight check on Saxenda. Has been taking 3mg  SQ daily, tolerating well without side effects. Doing well overall on appetite and diet. Has worked to increase protein in her diet for the past few weeks. Not exercising regularly. Recently went to the beach and was able to relax and enjoy herself some. Slept well while on vacation. Admits to some constipation, approximately 2 BMs per week. Has developed some trouble with hemorrhoids that have been bothering her. Denies fever, chills, shortness of breath, chest pain, abdominal pain, nausea, vomiting, and diarrhea.  I reviewed the past medical history, family history, social history, surgical history, and allergies today and no changes were needed.  Please see the problem list section below in epic for further details.  Past Medical History: Past Medical History:  Diagnosis Date  . Abnormal mammogram   . Enthesopathy   . H/O degenerative disc disease   . Hypertension    Past Surgical History: Past Surgical History:  Procedure Laterality Date  . ABDOMINAL HYSTERECTOMY    . CARPAL TUNNEL RELEASE    . CESAREAN SECTION    . CYST REMOVAL TRUNK    . ECTOPIC PREGNANCY SURGERY     Social History: Social History   Socioeconomic History  . Marital status: Married    Spouse name: Not on file  . Number of children: Not on file  . Years of education: Not on file  . Highest education level: Not on file  Occupational History  . Not on file  Tobacco Use  . Smoking status: Never Smoker  . Smokeless tobacco: Never Used  Vaping Use  . Vaping Use: Never used  Substance and Sexual Activity  . Alcohol use: Yes    Comment: 1 drink/month  . Drug use: Never  . Sexual activity: Yes    Birth control/protection: Surgical  Other Topics Concern  . Not on file  Social History Narrative  . Not on file   Social Determinants of Health   Financial Resource Strain:   .  Difficulty of Paying Living Expenses: Not on file  Food Insecurity:   . Worried About in the Last Year: Not on file  . Ran Out of Food in the Last Year: Not on file  Transportation Needs:   . Lack of Transportation (Medical): Not on file  . Lack of Transportation (Non-Medical): Not on file  Physical Activity:   . Days of Exercise per Week: Not on file  . Minutes of Exercise per Session: Not on file  Stress:   . Feeling of Stress : Not on file  Social Connections:   . Frequency of Communication with Friends and Family: Not on file  . Frequency of Social Gatherings with Friends and Family: Not on file  . Attends Religious Services: Not on file  . Active Member of Clubs or Organizations: Not on file  . Attends Programme researcher, broadcasting/film/video Meetings: Not on file  . Marital Status: Not on file   Family History: Family History  Problem Relation Age of Onset  . Hypertension Mother   . Hypertension Father   . Prostate cancer Father    Allergies: Allergies  Allergen Reactions  . Imitrex [Sumatriptan]     Causes chest tightness  . Lisinopril Cough   Medications: See med rec.  Review of Systems: See HPI for pertinent positives and negatives.   Objective:    General: Well Developed, well nourished, and  in no acute distress.  Neuro: Alert and oriented x3.  HEENT: Normocephalic, atraumatic.  Skin: Warm and dry. Cardiac: Regular rate and rhythm, no murmurs rubs or gallops, no lower extremity edema.  Respiratory: Clear to auscultation bilaterally. Not using accessory muscles, speaking in full sentences.  Impression and Recommendations:    1. Weight gain In last 4 weeks, has lost 8.5lbs. Continue Saxenda 3mg  SQ daily. Continue dietary modifications. Recommend incorporating exercise 3 times weekly.    2. Need for influenza vaccination Flu vaccine given in office.  - Flu Vaccine QUAD 36+ mos IM  3. Hemorrhoids, unspecified hemorrhoid type Anusol suppositories BID prn  for hemorrhoids. Encouraged increased water intake. Consider Miralax to treat constipation.   Return in about 4 weeks (around 05/11/2020) for weight check . ___________________________________________ 07/11/2020, DNP, APRN, FNP-BC Primary Care and Sports Medicine [facility] [place]

## 2020-04-13 NOTE — Patient Instructions (Signed)

## 2020-05-06 MED FILL — SAXENDA 18 MG/3 ML PEN: 18 | 30 days supply | Qty: 15 | Fill #1

## 2020-05-12 ENCOUNTER — Ambulatory Visit (INDEPENDENT_AMBULATORY_CARE_PROVIDER_SITE_OTHER): Payer: No Typology Code available for payment source | Admitting: Medical-Surgical

## 2020-05-12 ENCOUNTER — Encounter: Payer: Self-pay | Admitting: Medical-Surgical

## 2020-05-12 ENCOUNTER — Other Ambulatory Visit: Payer: Self-pay

## 2020-05-12 MED ORDER — SEMAGLUTIDE-WEIGHT MANAGEMENT 2.4 MG/0.75ML ~~LOC~~ SOAJ: 2.4000 mg | SUBCUTANEOUS | 3 refills | Status: DC

## 2020-05-12 MED ORDER — SEMAGLUTIDE-WEIGHT MANAGEMENT 1 MG/0.5ML ~~LOC~~ SOAJ: 1.0000 mg | SUBCUTANEOUS | 0 refills | Status: AC

## 2020-05-12 MED ORDER — SEMAGLUTIDE-WEIGHT MANAGEMENT 1.7 MG/0.75ML ~~LOC~~ SOAJ: 1.7000 mg | SUBCUTANEOUS | 0 refills | Status: AC

## 2020-05-12 NOTE — Progress Notes (Signed)
Subjective:    CC: Weight check  HPI: Pleasant 49 year old female presenting today for weight check on Saxenda.  She has been using Saxenda 3 mg daily without difficulty, tolerating well with no side effects.  Notes that her appetite is still low and she gets full quickly.  Feels that she does not know what to eat to help her lose weight.  Notes that her clothes do feel like they are fitting better with looser waistbands, looser shirts, etc. but her scale does not seem to be changing.  She is currently working approximately 4 days/week.  Admits to not exercising on her days off.  Continues to have difficulty sleeping.  She is not using trazodone every night and on those nights reports waking frequently.  I reviewed the past medical history, family history, social history, surgical history, and allergies today and no changes were needed.  Please see the problem list section below in epic for further details.  Past Medical History: Past Medical History:  Diagnosis Date  . Abnormal mammogram   . Enthesopathy   . H/O degenerative disc disease   . Hypertension    Past Surgical History: Past Surgical History:  Procedure Laterality Date  . ABDOMINAL HYSTERECTOMY    . CARPAL TUNNEL RELEASE    . CESAREAN SECTION    . CYST REMOVAL TRUNK    . ECTOPIC PREGNANCY SURGERY     Social History: Social History   Socioeconomic History  . Marital status: Married    Spouse name: Not on file  . Number of children: Not on file  . Years of education: Not on file  . Highest education level: Not on file  Occupational History  . Not on file  Tobacco Use  . Smoking status: Never Smoker  . Smokeless tobacco: Never Used  Vaping Use  . Vaping Use: Never used  Substance and Sexual Activity  . Alcohol use: Yes    Comment: 1 drink/month  . Drug use: Never  . Sexual activity: Yes    Birth control/protection: Surgical  Other Topics Concern  . Not on file  Social History Narrative  . Not on file    Social Determinants of Health   Financial Resource Strain:   . Difficulty of Paying Living Expenses: Not on file  Food Insecurity:   . Worried About Programme researcher, broadcasting/film/video in the Last Year: Not on file  . Ran Out of Food in the Last Year: Not on file  Transportation Needs:   . Lack of Transportation (Medical): Not on file  . Lack of Transportation (Non-Medical): Not on file  Physical Activity:   . Days of Exercise per Week: Not on file  . Minutes of Exercise per Session: Not on file  Stress:   . Feeling of Stress : Not on file  Social Connections:   . Frequency of Communication with Friends and Family: Not on file  . Frequency of Social Gatherings with Friends and Family: Not on file  . Attends Religious Services: Not on file  . Active Member of Clubs or Organizations: Not on file  . Attends Banker Meetings: Not on file  . Marital Status: Not on file   Family History: Family History  Problem Relation Age of Onset  . Hypertension Mother   . Hypertension Father   . Prostate cancer Father    Allergies: Allergies  Allergen Reactions  . Imitrex [Sumatriptan]     Causes chest tightness  . Lisinopril Cough   Medications: See med  rec.  Review of Systems: See HPI for pertinent positives and negatives.   Objective:    General: Well Developed, well nourished, and in no acute distress.  Neuro: Alert and oriented x3.  HEENT: Normocephalic, atraumatic.  Skin: Warm and dry. Cardiac: Regular rate and rhythm, no murmurs rubs or gallops, no lower extremity edema.  Respiratory: Clear to auscultation bilaterally. Not using accessory muscles, speaking in full sentences.   Impression and Recommendations:    1. Morbid obesity with body mass index (BMI) of 40.0 or higher (HCC) No further weight loss or gain.  Discontinue Saxenda.  Switching to [facility]  As she has been on Saxenda 3 mg daily we will start Wegovy at 1 mg weekly x4 weeks.  Since adequate nutrition seems to be  an issue, referring to medical nutrition therapy. - Amb ref to Medical Nutrition Therapy-MNT  Return in about 4 weeks (around 06/09/2020) for Weight check. ___________________________________________ Thayer Ohm, DNP, APRN, FNP-BC Primary Care and Sports Medicine [facility]

## 2020-06-02 ENCOUNTER — Other Ambulatory Visit: Payer: Self-pay | Admitting: Medical-Surgical

## 2020-06-08 NOTE — Progress Notes (Signed)
Subjective:    CC: weight check  HPI: Pleasant 48 year old female presenting today for weight check. She was previously on Saxenda but switched to [facility] 4 weeks ago. She has tolerated the medication well without the normal side effects. Has continued to limit her calories but notes that she is eating more salads and making healthy choices. Has been fast walking on her treadmill for at least 15 minutes every morning. Has not weighed herself in the last month. Has an appointment coming up with a nutritionist.   Notes that 2.5 weeks ago she began to have a sensation of internal itching. It is constant and involves her ears, throat, neck, chest, and back. She tried taking Zyrtec which was somewhat helpful but has not found anything else to help or resolve the situation. No new food/chemical/cosmetic exposures. The only new medication was the [facility]. Denies difficulty breathing, throat swelling, difficulty swallowing, and hives/skin rash.   I reviewed the past medical history, family history, social history, surgical history, and allergies today and no changes were needed.  Please see the problem list section below in epic for further details.  Past Medical History: Past Medical History:  Diagnosis Date  . Abnormal mammogram   . Enthesopathy   . H/O degenerative disc disease   . Hypertension    Past Surgical History: Past Surgical History:  Procedure Laterality Date  . ABDOMINAL HYSTERECTOMY    . CARPAL TUNNEL RELEASE    . CESAREAN SECTION    . CYST REMOVAL TRUNK    . ECTOPIC PREGNANCY SURGERY     Social History: Social History   Socioeconomic History  . Marital status: Married    Spouse name: Not on file  . Number of children: Not on file  . Years of education: Not on file  . Highest education level: Not on file  Occupational History  . Not on file  Tobacco Use  . Smoking status: Never Smoker  . Smokeless tobacco: Never Used  Vaping Use  . Vaping Use: Never used  Substance  and Sexual Activity  . Alcohol use: Yes    Comment: 1 drink/month  . Drug use: Never  . Sexual activity: Yes    Birth control/protection: Surgical  Other Topics Concern  . Not on file  Social History Narrative  . Not on file   Social Determinants of Health   Financial Resource Strain:   . Difficulty of Paying Living Expenses: Not on file  Food Insecurity:   . Worried About Programme researcher, broadcasting/film/video in the Last Year: Not on file  . Ran Out of Food in the Last Year: Not on file  Transportation Needs:   . Lack of Transportation (Medical): Not on file  . Lack of Transportation (Non-Medical): Not on file  Physical Activity:   . Days of Exercise per Week: Not on file  . Minutes of Exercise per Session: Not on file  Stress:   . Feeling of Stress : Not on file  Social Connections:   . Frequency of Communication with Friends and Family: Not on file  . Frequency of Social Gatherings with Friends and Family: Not on file  . Attends Religious Services: Not on file  . Active Member of Clubs or Organizations: Not on file  . Attends Banker Meetings: Not on file  . Marital Status: Not on file   Family History: Family History  Problem Relation Age of Onset  . Hypertension Mother   . Hypertension Father   . Prostate cancer  Father    Allergies: Allergies  Allergen Reactions  . Imitrex [Sumatriptan]     Causes chest tightness  . Lisinopril Cough   Medications: See med rec.  Review of Systems: See HPI for pertinent positives and negatives.   Objective:    General: Well Developed, well nourished, and in no acute distress.  Neuro: Alert and oriented x3.  HEENT: Normocephalic, atraumatic.  Skin: Warm and dry, no rashes. Cardiac: Regular rate and rhythm, no murmurs rubs or gallops, no lower extremity edema.  Respiratory: Clear to auscultation bilaterally. Not using accessory muscles, speaking in full sentences.   Impression and Recommendations:    1. Morbid obesity with  body mass index (BMI) of 40.0 or higher (HCC) 2 pound weight gain in the last 4 weeks the patient is noting that her clothes do fit better.  Discussed in depth expectations of the weight loss journey.  Advised that she wait at least once weekly if not more often to keep her accountable.  Plan to follow-up with her nutritionist for more dietary modifications and recommendations.  2. Severe itching Unclear etiology.  Consider possible allergic reaction to [facility], altered liver function, anemia, thyroid dysfunction, or elevated phosphorus.  Checking labs as below.  Recommend Zyrtec 10 mg daily along with famotidine 20 mg nightly.  May benefit from using Benadryl at night to help with sleep as needed.  Recommend withholding her next dose of Wegovy that is due today to see if this helps with her symptoms.  Depo-Medrol 80 mg IM given in office today. - CBC - COMPLETE METABOLIC PANEL WITH GFR - TSH - Hemoglobin A1c - Phosphorus - IgE - methylPREDNISolone acetate (DEPO-MEDROL) injection 80 mg  Return in about 4 weeks (around 07/07/2020) for weight check. __________________________________________ Thayer Ohm, DNP, APRN, FNP-BC Primary Care and Sports Medicine [facility] [place]

## 2020-06-09 ENCOUNTER — Other Ambulatory Visit: Payer: Self-pay | Admitting: Medical-Surgical

## 2020-06-09 ENCOUNTER — Encounter: Payer: Self-pay | Admitting: Medical-Surgical

## 2020-06-09 ENCOUNTER — Ambulatory Visit (INDEPENDENT_AMBULATORY_CARE_PROVIDER_SITE_OTHER): Payer: No Typology Code available for payment source | Admitting: Medical-Surgical

## 2020-06-09 DIAGNOSIS — L299 Pruritus, unspecified: Secondary | ICD-10-CM

## 2020-06-09 MED ORDER — FAMOTIDINE 20 MG PO TABS: 20.0000 mg | ORAL_TABLET | Freq: Every day | ORAL | 1 refills | Status: DC

## 2020-06-09 MED ORDER — METHYLPREDNISOLONE ACETATE 80 MG/ML IJ SUSP
80.0000 mg | Freq: Once | INTRAMUSCULAR | Status: AC
  Administered 2020-06-09: 80 mg via INTRAMUSCULAR

## 2020-06-09 MED ORDER — CETIRIZINE HCL 10 MG PO TABS: 10.0000 mg | ORAL_TABLET | Freq: Every day | ORAL | 1 refills | Status: DC

## 2020-06-10 LAB — COMPLETE METABOLIC PANEL WITH GFR
AG Ratio: 1.4 (calc) (ref 1.0–2.5)
ALT: 23 U/L (ref 6–29)
AST: 18 U/L (ref 10–35)
Albumin: 4.2 g/dL (ref 3.6–5.1)
Alkaline phosphatase (APISO): 63 U/L (ref 37–153)
BUN: 11 mg/dL (ref 7–25)
CO2: 31 mmol/L (ref 20–32)
Calcium: 10.1 mg/dL (ref 8.6–10.4)
Chloride: 102 mmol/L (ref 98–110)
Creat: 0.98 mg/dL (ref 0.50–1.05)
GFR, Est African American: 78 mL/min/{1.73_m2} (ref >=60)
GFR, Est Non African American: 67 mL/min/{1.73_m2} (ref >=60)
Globulin: 2.9 g/dL (calc) (ref 1.9–3.7)
Glucose, Bld: 82 mg/dL (ref 65–99)
Potassium: 4.3 mmol/L (ref 3.5–5.3)
Sodium: 139 mmol/L (ref 135–146)
Total Bilirubin: 0.4 mg/dL (ref 0.2–1.2)
Total Protein: 7.1 g/dL (ref 6.1–8.1)

## 2020-06-10 LAB — CBC
HCT: 38.4 % (ref 35.0–45.0)
Hemoglobin: 13.3 g/dL (ref 11.7–15.5)
MCH: 30.2 pg (ref 27.0–33.0)
MCHC: 34.6 g/dL (ref 32.0–36.0)
MCV: 87.3 fL (ref 80.0–100.0)
MPV: 9.7 fL (ref 7.5–12.5)
Platelets: 397 10*3/uL (ref 140–400)
RBC: 4.4 10*6/uL (ref 3.80–5.10)
RDW: 12.9 % (ref 11.0–15.0)
WBC: 4.8 10*3/uL (ref 3.8–10.8)

## 2020-06-10 LAB — TSH: TSH: 0.95 mIU/L

## 2020-06-10 LAB — IGE: IgE (Immunoglobulin E), Serum: 229 kU/L — ABNORMAL HIGH (ref <=114)

## 2020-06-10 LAB — HEMOGLOBIN A1C
Hgb A1c MFr Bld: 5.7 % of total Hgb — ABNORMAL HIGH (ref <5.7)
Mean Plasma Glucose: 117 (calc)
eAG (mmol/L): 6.5 (calc)

## 2020-06-10 LAB — PHOSPHORUS: Phosphorus: 3.8 mg/dL (ref 2.5–4.5)

## 2020-06-29 ENCOUNTER — Ambulatory Visit: Payer: No Typology Code available for payment source | Admitting: Dietician

## 2020-07-07 ENCOUNTER — Ambulatory Visit: Payer: No Typology Code available for payment source | Admitting: Medical-Surgical

## 2020-07-07 ENCOUNTER — Ambulatory Visit: Payer: No Typology Code available for payment source | Admitting: Physician Assistant

## 2020-07-09 ENCOUNTER — Ambulatory Visit (INDEPENDENT_AMBULATORY_CARE_PROVIDER_SITE_OTHER): Payer: No Typology Code available for payment source

## 2020-07-09 ENCOUNTER — Encounter: Payer: Self-pay | Admitting: Medical-Surgical

## 2020-07-09 ENCOUNTER — Other Ambulatory Visit: Payer: Self-pay

## 2020-07-09 ENCOUNTER — Ambulatory Visit (INDEPENDENT_AMBULATORY_CARE_PROVIDER_SITE_OTHER): Payer: No Typology Code available for payment source | Admitting: Medical-Surgical

## 2020-07-09 VITALS — BP 141/86 | HR 80 | Temp 98.8°F | Ht 63.0 in | Wt 230.3 lb

## 2020-07-09 DIAGNOSIS — R0600 Dyspnea, unspecified: Secondary | ICD-10-CM

## 2020-07-09 DIAGNOSIS — R0609 Other forms of dyspnea: Secondary | ICD-10-CM

## 2020-07-09 DIAGNOSIS — R059 Cough, unspecified: Secondary | ICD-10-CM

## 2020-07-09 DIAGNOSIS — J329 Chronic sinusitis, unspecified: Secondary | ICD-10-CM | POA: Diagnosis not present

## 2020-07-09 DIAGNOSIS — J40 Bronchitis, not specified as acute or chronic: Secondary | ICD-10-CM

## 2020-07-09 DIAGNOSIS — Z7689 Persons encountering health services in other specified circumstances: Secondary | ICD-10-CM

## 2020-07-09 MED ORDER — METFORMIN HCL ER 500 MG PO TB24: 500.0000 mg | ORAL_TABLET | Freq: Every day | ORAL | 1 refills | Status: DC

## 2020-07-09 MED ORDER — AMOXICILLIN-POT CLAVULANATE 875-125 MG PO TABS: 1.0000 | ORAL_TABLET | Freq: Two times a day (BID) | ORAL | 0 refills | Status: DC

## 2020-07-09 MED ORDER — HYDROCODONE-HOMATROPINE 5-1.5 MG/5ML PO SYRP: 5.0000 mL | ORAL_SOLUTION | Freq: Three times a day (TID) | ORAL | 0 refills | Status: DC | PRN

## 2020-07-09 MED ORDER — BENZONATATE 200 MG PO CAPS: 200.0000 mg | ORAL_CAPSULE | Freq: Three times a day (TID) | ORAL | 0 refills | Status: DC | PRN

## 2020-07-09 MED FILL — FAMOTIDINE 20 MG TABLET: 20 | 30 days supply | Qty: 30 | Fill #0

## 2020-07-09 MED FILL — CETIRIZINE HCL 10 MG TABS: 10 | 30 days supply | Qty: 30 | Fill #0

## 2020-07-09 NOTE — Progress Notes (Signed)
Subjective:    CC: Weight check  HPI: Pleasant 49 year old female presenting today for weight check.  4 weeks ago, she was seen for weight check after a month on Wegovy.  At that appointment, she described a sensation of internal itching that was not responding to medications or improving.  Labs were drawn at that time showing an elevated IgE which I suspect was related to an allergy to [facility] as that had been the only thing changed.  She was advised at that time to stop the [facility] and see if her symptoms did improve.  We did not start any new medications at that time as she preferred to wait until her next office appointment to discuss her options. Today, she reports that her itching and skin sensitivity have resolved since stopping Wegovy. She does still have a deep itch in her chest with coughing but feels this may be related to her upper respiratory symptoms for the last 4 weeks. Her weight is stable off of medications and she continues to eat small portions. Interested in possibly retrying phentermine since she noticed a definite appetite suppression with that.   Continued upper respiratory symptoms including sinus congestion/pressure, headache, PND, and cough. Feels like she needs to cough up the secretions but is unable to get the out. Some chest discomfort with her cough and notes that she does have some mild DOE. No fevers/chills, vomiting, or diarrhea but has had some transient nausea. Has tried several OTC cold and flu medications as well as Robitussin and Flonase with no relief of symptoms. Having difficulty sleeping. When she blows her nose, has some pain in her left ear.   I reviewed the past medical history, family history, social history, surgical history, and allergies today and no changes were needed.  Please see the problem list section below in epic for further details.  Past Medical History: Past Medical History:  Diagnosis Date  . Abnormal mammogram   . Enthesopathy   . H/O  degenerative disc disease   . Hypertension    Past Surgical History: Past Surgical History:  Procedure Laterality Date  . ABDOMINAL HYSTERECTOMY    . CARPAL TUNNEL RELEASE    . CESAREAN SECTION    . CYST REMOVAL TRUNK    . ECTOPIC PREGNANCY SURGERY     Social History: Social History   Socioeconomic History  . Marital status: Married    Spouse name: Not on file  . Number of children: Not on file  . Years of education: Not on file  . Highest education level: Not on file  Occupational History  . Not on file  Tobacco Use  . Smoking status: Never Smoker  . Smokeless tobacco: Never Used  Vaping Use  . Vaping Use: Never used  Substance and Sexual Activity  . Alcohol use: Yes    Comment: 1 drink/month  . Drug use: Never  . Sexual activity: Yes    Birth control/protection: Surgical  Other Topics Concern  . Not on file  Social History Narrative  . Not on file   Social Determinants of Health   Financial Resource Strain: Not on file  Food Insecurity: Not on file  Transportation Needs: Not on file  Physical Activity: Not on file  Stress: Not on file  Social Connections: Not on file   Family History: Family History  Problem Relation Age of Onset  . Hypertension Mother   . Hypertension Father   . Prostate cancer Father    Allergies: Allergies  Allergen Reactions  .  Imitrex [Sumatriptan]     Causes chest tightness  . Lisinopril Cough  . Semaglutide Itching   Medications: See med rec.  Review of Systems: See HPI for pertinent positives and negatives.   Objective:    General: Well Developed, well nourished, and in no acute distress.  Neuro: Alert and oriented x3.  HEENT: Normocephalic, atraumatic, pupils equal round reactive to light, neck supple, no masses, + cervical lymphadenopathy. Bilateral TMs intact, cloudy without erythema or bulging.  Skin: Warm and dry. Cardiac: Regular rate and rhythm, no murmurs rubs or gallops, no lower extremity edema.   Respiratory: Clear to auscultation bilaterally. Not using accessory muscles, speaking in full sentences.  Impression and Recommendations:    1. Encounter for weight management Discussed weight management options. Her BP is not at goal today so phentermine is not indicated at this time (she missed 2 days of BP meds). We have tried [place]. Wants to avoid Wellbutrin/Contrave. Discussed off label use of Metformin as she is prediabetic and may get multiple benefits. She is willing to trial it so starting Metformin XL 500mg  daily with breakfast. Encourage rescheduling nutritionist appointment that was missed.    2. Cough/DOE With her intractable cough and new onset DOE, getting CXR to evaluate for possible atypical pneumonia. - DG Chest 2 View; Future  3. Sinobronchitis Augmentin BID x 7 days. Sending in Hycodan cough syrup and Tessalon perls TID prn. Consider saline nasal sprays/rinses and a humidifier. Consider continuing Mucinex.   Return in about 4 weeks (around 08/06/2020) for weight check. ___________________________________________ 10/04/2020, DNP, APRN, FNP-BC Primary Care and Sports Medicine [facility] [place]

## 2020-07-15 MED FILL — METOPROLOL SUCCINATE ER 25: 25 | 90 days supply | Qty: 90 | Fill #0

## 2020-07-22 ENCOUNTER — Telehealth: Payer: Self-pay | Admitting: Neurology

## 2020-07-22 NOTE — Telephone Encounter (Signed)
Received PA request for Saxenda, after reviewing chart patient has switched to [facility]. PA not performed.

## 2020-07-27 NOTE — Telephone Encounter (Signed)
This encounter was created in error - please disregard.

## 2020-07-28 ENCOUNTER — Encounter: Payer: Self-pay | Admitting: Medical-Surgical

## 2020-07-28 ENCOUNTER — Telehealth (INDEPENDENT_AMBULATORY_CARE_PROVIDER_SITE_OTHER): Payer: No Typology Code available for payment source | Admitting: Medical-Surgical

## 2020-07-28 VITALS — BP 138/94 | HR 82 | Temp 98.1°F

## 2020-07-28 DIAGNOSIS — J329 Chronic sinusitis, unspecified: Secondary | ICD-10-CM

## 2020-07-28 DIAGNOSIS — J40 Bronchitis, not specified as acute or chronic: Secondary | ICD-10-CM | POA: Diagnosis not present

## 2020-07-28 MED ORDER — HYDROCOD POLST-CPM POLST ER 10-8 MG/5ML PO SUER: 5.0000 mL | Freq: Two times a day (BID) | ORAL | 0 refills | Status: DC | PRN

## 2020-07-28 MED ORDER — DOXYCYCLINE HYCLATE 100 MG PO TABS: 100.0000 mg | ORAL_TABLET | Freq: Two times a day (BID) | ORAL | 0 refills | Status: AC

## 2020-07-28 MED ORDER — PREDNISONE 10 MG (48) PO TBPK: ORAL_TABLET | Freq: Every day | ORAL | 0 refills | Status: DC

## 2020-07-28 NOTE — Progress Notes (Signed)
Virtual Visit via Video Note  I connected with Amy Diaz on 07/28/20 at  1:00 PM EST by a video enabled telemedicine application and verified that I am speaking with the correct person using two identifiers.   I discussed the limitations of evaluation and management by telemedicine and the availability of in person appointments. The patient expressed understanding and agreed to proceed.  Patient location: home Provider locations: office  Subjective:    CC: Continued upper respiratory symptoms  HPI: Pleasant 49 year old female presenting via MyChart video visit to discuss continued upper respiratory symptoms.  She was last seen on 12/10 and treated for sinobronchitis with Hycodan cough syrup, benzonatate, and Augmentin.  She finished the Augmentin prescription and notes that her symptoms may have gotten a little bit better but since she stopped the prescription and they got worse.  The pharmacy was out of Hycodan cough syrup and she was never able to get that.  The benzonatate did not help with her cough at all.  She has been coughing almost nonstop and notes that she does have chest congestion.  If she breathes normally, she goes into severe coughing spells.  She has been short of breath even with regular activities and finds herself doing pursed lip breathing when she has increased activity.  Her ears and throat are itchy and she continues to have itchiness on her right torso.  She has tried some liquid Mucinex which seems to work better than the pills but is still not very effective.   Past medical history, Surgical history, Family history not pertinant except as noted below, Social history, Allergies, and medications have been entered into the medical record, reviewed, and corrections made.   Review of Systems: See HPI for pertinent positives and negatives.   Objective:    General: Speaking clearly in complete sentences without any shortness of breath.  Alert and oriented x3.   Normal judgment. No apparent acute distress.  Impression and Recommendations:    1. Sinobronchitis Some mild improvement with worsening after finishing Augmentin.  She has had to take doxycycline for a respiratory infection in the past so we will try 7 days of doxycycline twice daily.  Also sending in a 12-day prednisone taper pack.  Pharmacy was contacted and they have Tussionex in stock so sending in a prescription for that twice daily as needed.  Okay to continue with the Mucinex if this is beneficial.  Continue cetirizine and famotidine as prescribed.  I discussed the assessment and treatment plan with the patient. The patient was provided an opportunity to ask questions and all were answered. The patient agreed with the plan and demonstrated an understanding of the instructions.   The patient was advised to call back or seek an in-person evaluation if the symptoms worsen or if the condition fails to improve as anticipated.  20 minutes of non-face-to-face time was provided during this encounter.  No follow-ups on file.  Thayer Ohm, DNP, APRN, FNP-BC [facility] and Sports Medicine

## 2020-07-31 ENCOUNTER — Other Ambulatory Visit: Payer: Self-pay | Admitting: Medical-Surgical

## 2020-08-16 ENCOUNTER — Ambulatory Visit: Payer: No Typology Code available for payment source | Admitting: Medical-Surgical

## 2020-08-18 ENCOUNTER — Encounter: Payer: Self-pay | Admitting: Medical-Surgical

## 2020-08-18 ENCOUNTER — Ambulatory Visit (INDEPENDENT_AMBULATORY_CARE_PROVIDER_SITE_OTHER): Payer: No Typology Code available for payment source | Admitting: Medical-Surgical

## 2020-08-18 ENCOUNTER — Other Ambulatory Visit: Payer: Self-pay | Admitting: Medical-Surgical

## 2020-08-18 VITALS — BP 132/86 | HR 88

## 2020-08-18 DIAGNOSIS — Z7689 Persons encountering health services in other specified circumstances: Secondary | ICD-10-CM | POA: Diagnosis not present

## 2020-08-18 DIAGNOSIS — R059 Cough, unspecified: Secondary | ICD-10-CM | POA: Diagnosis not present

## 2020-08-18 DIAGNOSIS — J989 Respiratory disorder, unspecified: Secondary | ICD-10-CM

## 2020-08-18 MED ORDER — CETIRIZINE HCL 10 MG PO TABS: 10.0000 mg | ORAL_TABLET | Freq: Every day | ORAL | 1 refills | Status: DC

## 2020-08-18 MED ORDER — METFORMIN HCL ER 500 MG PO TB24: 500.0000 mg | ORAL_TABLET | Freq: Every day | ORAL | 1 refills | Status: DC

## 2020-08-18 MED FILL — CETIRIZINE HCL 10 MG TABS: 10 | 90 days supply | Qty: 90 | Fill #0

## 2020-08-18 MED FILL — METFORMIN HCL ER 500 MG TB2: 500 | 90 days supply | Qty: 90 | Fill #0

## 2020-08-18 NOTE — Progress Notes (Signed)
Virtual Visit via Video Note  I connected with Amy Diaz on 08/18/20 at 10:30 AM EST by a video enabled telemedicine application and verified that I am speaking with the correct person using two identifiers.   I discussed the limitations of evaluation and management by telemedicine and the availability of in person appointments. The patient expressed understanding and agreed to proceed.  Patient location: home Provider locations: office  Subjective:    CC: weight check  HPI: Pleasant 50 year old female presenting via MyChart video visit for weight check.  She was previously treated with phentermine as well as TFTDDU.  She did not tolerate the [facility] well and did not see any results from phentermine.  At our last appointment, we discussed a trial of metformin since this is often used off label for weight loss.  She has not started the medication and notes that her first 1 to 2 weeks, she had some GI side effects.  These GI side effects have resolved now and she tolerates the medication without difficulty.  Over the past 4 weeks, she notes that she has not lost any weight but admits that she spent a vacation in [place] and was not careful with her food and alcohol intake at that time.  She also spent a portion of the last month on prednisone for her upper respiratory illness that was not responding well to antibiotics alone.  She would like to continue on the metformin for another couple of months to see how she does with this.  Admits that in between her illness and her vacation, she has not been in touch with the dietitian to reschedule her appointment that was missed.  Continues to have an itchy sensation in her upper chest, throat, extending up to her right ear.  Notes that she coughs frequently because of the itchy sensation.  The prednisone did not resolve this.  She is taking cetirizine daily but this also has not made a difference in the sensation.  Past medical history, Surgical  history, Family history not pertinant except as noted below, Social history, Allergies, and medications have been entered into the medical record, reviewed, and corrections made.   Review of Systems: See HPI for pertinent positives and negatives.   Objective:    General: Speaking clearly in complete sentences without any shortness of breath.  Alert and oriented x3.  Normal judgment. No apparent acute distress.  Impression and Recommendations:    1. Encounter for weight management Continue metformin 500 mg XL daily.  Strongly recommend contacting the dietitian to get rescheduled.  Resume diet and exercise modifications.  Briefly discussed other options including Wellbutrin, Topamax, and referral to medical weight management.  She would like to hold off on the other options at this time.  2. Cough/disorder of airway(itching) With her symptoms continuing for quite a while, referring to ENT for further evaluation. - Ambulatory referral to ENT  I discussed the assessment and treatment plan with the patient. The patient was provided an opportunity to ask questions and all were answered. The patient agreed with the plan and demonstrated an understanding of the instructions.   The patient was advised to call back or seek an in-person evaluation if the symptoms worsen or if the condition fails to improve as anticipated.  20 minutes of non-face-to-face time was provided during this encounter.  Return in about 2 months (around 10/16/2020) for weight management follow up.  Thayer Ohm, DNP, APRN, FNP-BC [facility] and Sports Medicine

## 2020-09-06 ENCOUNTER — Telehealth: Payer: Self-pay

## 2020-09-06 DIAGNOSIS — H938X3 Other specified disorders of ear, bilateral: Secondary | ICD-10-CM

## 2020-09-06 DIAGNOSIS — R635 Abnormal weight gain: Secondary | ICD-10-CM

## 2020-09-06 DIAGNOSIS — R0981 Nasal congestion: Secondary | ICD-10-CM

## 2020-09-06 MED ORDER — PHENTERMINE HCL 37.5 MG PO CAPS
ORAL_CAPSULE | ORAL | 0 refills | Status: DC
Start: 2020-09-06 — End: 2021-02-10

## 2020-09-06 NOTE — Telephone Encounter (Signed)
Referral entered as requested.   Phentermine prescription sent as requested. Will need to return in 4 weeks for weight check.

## 2020-09-06 NOTE — Telephone Encounter (Signed)
Patient aware via voicemail that the referral has been entered and the Rx has been sent to her pharmacy. Instructed patient to call back to schedule a weight check appt in 4 weeks.

## 2020-09-06 NOTE — Telephone Encounter (Signed)
Pt called with the name of the ENT she would like to be referred to:  Dr. Dillard Cannon [address], [place] [address] [phone] phone   Pt states that her BP readings have been great with the last reading being 121/83. Pt would like to restart phentermine 37.5 mg. She says that the metformin is not helping at all. Rx has been tee'd up below and ready for review and approval/denial.

## 2020-09-10 MED FILL — LOSARTAN POTASSIUM 50 MG TA: 50 | 30 days supply | Qty: 60 | Fill #0

## 2020-09-10 MED FILL — HYDROCHLOROTHIAZIDE 25 MG T: 25 | 30 days supply | Qty: 30 | Fill #0

## 2020-09-27 ENCOUNTER — Other Ambulatory Visit: Payer: Self-pay

## 2020-09-27 ENCOUNTER — Encounter (INDEPENDENT_AMBULATORY_CARE_PROVIDER_SITE_OTHER): Payer: Self-pay | Admitting: Otolaryngology

## 2020-09-27 ENCOUNTER — Ambulatory Visit (INDEPENDENT_AMBULATORY_CARE_PROVIDER_SITE_OTHER): Payer: No Typology Code available for payment source | Admitting: Otolaryngology

## 2020-09-27 ENCOUNTER — Other Ambulatory Visit (HOSPITAL_COMMUNITY): Payer: Self-pay | Admitting: Otolaryngology

## 2020-09-27 VITALS — Temp 97.2°F

## 2020-09-27 DIAGNOSIS — K219 Gastro-esophageal reflux disease without esophagitis: Secondary | ICD-10-CM

## 2020-09-27 DIAGNOSIS — R0989 Other specified symptoms and signs involving the circulatory and respiratory systems: Secondary | ICD-10-CM

## 2020-09-27 DIAGNOSIS — J310 Chronic rhinitis: Secondary | ICD-10-CM | POA: Diagnosis not present

## 2020-09-27 DIAGNOSIS — R6889 Other general symptoms and signs: Secondary | ICD-10-CM | POA: Diagnosis not present

## 2020-09-27 MED FILL — OMEPRAZOLE 40 MG CPDR: 40 | 30 days supply | Qty: 30 | Fill #0

## 2020-09-27 NOTE — Progress Notes (Signed)
HPI: Amy Diaz is a 50 y.o. female who presents is referred by her PCP for evaluation of ear and "sinus complaints".  She states that since October she complains of chronic postnasal drainage and chronically "clearing her throat"..  She also complains of "ear pressure" and itching throat. She has been on 2 rounds of antibiotics as well as prednisone.  She is presently on Pepcid and Zyrtec.  Past Medical History:  Diagnosis Date  . Abnormal mammogram   . Enthesopathy   . H/O degenerative disc disease   . Hypertension    Past Surgical History:  Procedure Laterality Date  . ABDOMINAL HYSTERECTOMY    . CARPAL TUNNEL RELEASE    . CESAREAN SECTION    . CYST REMOVAL TRUNK    . ECTOPIC PREGNANCY SURGERY     Social History   Socioeconomic History  . Marital status: Married    Spouse name: Not on file  . Number of children: Not on file  . Years of education: Not on file  . Highest education level: Not on file  Occupational History  . Not on file  Tobacco Use  . Smoking status: Never Smoker  . Smokeless tobacco: Never Used  Vaping Use  . Vaping Use: Never used  Substance and Sexual Activity  . Alcohol use: Yes    Comment: 1 drink/month  . Drug use: Never  . Sexual activity: Yes    Birth control/protection: Surgical  Other Topics Concern  . Not on file  Social History Narrative  . Not on file   Social Determinants of Health   Financial Resource Strain: Not on file  Food Insecurity: Not on file  Transportation Needs: Not on file  Physical Activity: Not on file  Stress: Not on file  Social Connections: Not on file   Family History  Problem Relation Age of Onset  . Hypertension Mother   . Hypertension Father   . Prostate cancer Father    Allergies  Allergen Reactions  . Imitrex [Sumatriptan]     Causes chest tightness  . Lisinopril Cough  . Semaglutide Itching   Prior to Admission medications   Medication Sig Start Date End Date Taking? Authorizing Provider   benzonatate (TESSALON) 200 MG capsule Take 1 capsule (200 mg total) by mouth 3 (three) times daily as needed for cough. 07/09/20   Christen Butter, NP  cetirizine (ZYRTEC) 10 MG tablet Take 1 tablet (10 mg total) by mouth daily. 08/18/20   Christen Butter, NP  chlorpheniramine-HYDROcodone (TUSSIONEX) 10-8 MG/5ML SUER Take 5 mLs by mouth every 12 (twelve) hours as needed for cough (cough, will cause drowsiness.). 07/28/20   Christen Butter, NP  famotidine (PEPCID) 20 MG tablet Take 1 tablet (20 mg total) by mouth at bedtime. 06/09/20   Christen Butter, NP  Fluocinolone Acetonide Body 0.01 % OIL Apply 1 application topically daily. 12/26/19   Christen Butter, NP  hydrocortisone (ANUSOL-HC) 25 MG suppository Place 1 suppository (25 mg total) rectally 2 (two) times daily as needed for hemorrhoids. 04/13/20   Christen Butter, NP  losartan-hydrochlorothiazide (HYZAAR) 100-25 MG tablet Take 1 tablet by mouth daily. 11/03/19   Christen Butter, NP  metFORMIN (GLUCOPHAGE XR) 500 MG 24 hr tablet Take 1 tablet (500 mg total) by mouth daily with breakfast. 08/18/20   Christen Butter, NP  metoprolol succinate (TOPROL-XL) 25 MG 24 hr tablet Take 1 tablet (25 mg total) by mouth daily. 11/03/19   Christen Butter, NP  naproxen sodium (ALEVE) 220 MG tablet Take 220 mg  by mouth 2 (two) times daily as needed.    [provider]  phentermine 37.5 MG capsule One capsule by mouth qAM 09/06/20   Christen Butter, NP  traZODone (DESYREL) 50 MG tablet Take 0.5-1 tablets (25-50 mg total) by mouth at bedtime as needed for sleep. 11/03/19   Christen Butter, NP     Positive ROS: Otherwise negative  All other systems have been reviewed and were otherwise negative with the exception of those mentioned in the HPI and as above.  Physical Exam: Constitutional: Alert, well-appearing, no acute distress Ears: External ears without lesions or tenderness.  Right ear canal and right TM are clear.  Left ear canal has some debris that was cleaned with suction and forceps.  The left  TM was clear.  No middle ear effusion noted on either side. Nasal: External nose without lesions.  Mild septal deformity.  With moderate rhinitis.  After decongesting the nose nasal endoscopy was performed on both sides and on nasal endoscopy both middle meatus regions were clear.  The nasopharynx was clear.  Eustachian tube areas were unobstructed. Oral: Lips and gums without lesions. Tongue and palate mucosa without lesions. Posterior oropharynx clear.  No significant postnasal drainage noted in the posterior oropharynx. Fiberoptic laryngoscopy was performed through the right nostril and on fiberoptic laryngoscopy the nasopharynx was clear.  Eustachian tube regions were clear.  The base of tongue, vallecula and epiglottis were normal.  Vocal cords were clear bilaterally with normal vocal mobility.  Both piriform sinuses were clear.  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 Neck: No palpable adenopathy or masses.  No palpable thyroid nodules. Respiratory: Breathing comfortably  Skin: No facial/neck lesions or rash noted.  Laryngoscopy  Date/Time: 09/27/2020 6:00 PM Performed by: Drema Halon, MD Authorized by: Drema Halon, MD   Consent:    Consent obtained:  Verbal   Consent given by:  Patient Procedure details:    Indications: direct visualization of the upper aerodigestive tract     Medication:  Afrin   Instrument: flexible fiberoptic laryngoscope     Scope location: bilateral nare   Sinus:    Right middle meatus: normal     Left middle meatus: normal     Right nasopharynx: normal     Left nasopharynx: normal     Right Eustachian tube orifices: normal     Left Eustachian tube orifices: normal   Mouth:    Oropharynx: normal     Vallecula: normal     Base of tongue: normal   Throat:    Pyriform sinus: normal     True vocal cords: normal    Comments:     Patient with mild edema of the arytenoid mucosa consistent with 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    Assessment: Chronic rhinitis with no clinical evidence of active infection.  She probably has allergic rhinitis and may benefit by seeing an allergist as she complains of a lot of itching. 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  Plan: Recommended regular use of nasal steroid spray and prescribed either Flonase or Nasacort 2 sprays each nostril at night as this should help some with nasal congestion as well as postnasal drainage.  Also discussed with her concerning using saline rinses as needed during the day. I prescribed omeprazole 40 mg daily before dinner for the next 24months instead of the Pepcid. She  might benefit by seeing an allergist if she continues to complain of chronic itching.   Narda Bonds, MD   CC:

## 2020-10-22 ENCOUNTER — Other Ambulatory Visit (HOSPITAL_BASED_OUTPATIENT_CLINIC_OR_DEPARTMENT_OTHER): Payer: Self-pay

## 2020-11-03 ENCOUNTER — Other Ambulatory Visit (HOSPITAL_COMMUNITY): Payer: Self-pay

## 2020-11-03 ENCOUNTER — Other Ambulatory Visit: Payer: Self-pay | Admitting: Medical-Surgical

## 2020-11-03 ENCOUNTER — Other Ambulatory Visit (HOSPITAL_BASED_OUTPATIENT_CLINIC_OR_DEPARTMENT_OTHER): Payer: Self-pay

## 2020-11-03 MED ORDER — HYDROCHLOROTHIAZIDE 25 MG PO TABS
ORAL_TABLET | Freq: Every day | ORAL | 2 refills | Status: DC
  Filled 2020-11-03: qty 90, 90d supply, fill #0

## 2020-11-03 MED ORDER — LOSARTAN POTASSIUM 50 MG PO TABS
ORAL_TABLET | ORAL | 2 refills | Status: DC
  Filled 2020-11-03: qty 180, 90d supply, fill #0

## 2020-11-13 ENCOUNTER — Other Ambulatory Visit: Payer: Self-pay | Admitting: Medical-Surgical

## 2020-11-13 DIAGNOSIS — R635 Abnormal weight gain: Secondary | ICD-10-CM

## 2020-11-13 DIAGNOSIS — I10 Essential (primary) hypertension: Secondary | ICD-10-CM

## 2020-11-13 MED FILL — Triamcinolone Acetonide Nasal Aerosol Suspension 55 MCG/ACT: NASAL | 30 days supply | Qty: 16.9 | Fill #0 | Status: CN

## 2020-11-13 MED FILL — Omeprazole Cap Delayed Release 40 MG: ORAL | 30 days supply | Qty: 30 | Fill #0 | Status: AC

## 2020-11-15 ENCOUNTER — Other Ambulatory Visit (HOSPITAL_COMMUNITY): Payer: Self-pay

## 2020-11-15 MED ORDER — FAMOTIDINE 20 MG PO TABS
ORAL_TABLET | Freq: Every day | ORAL | 0 refills | Status: DC
  Filled 2020-11-15: qty 30, 30d supply, fill #0

## 2020-11-15 MED ORDER — METOPROLOL SUCCINATE ER 25 MG PO TB24
ORAL_TABLET | Freq: Every day | ORAL | 2 refills | Status: DC
  Filled 2020-11-15: qty 90, 90d supply, fill #0
  Filled 2021-04-12: qty 90, 90d supply, fill #1
  Filled 2021-08-04: qty 90, 90d supply, fill #2

## 2020-11-16 ENCOUNTER — Other Ambulatory Visit (HOSPITAL_COMMUNITY): Payer: Self-pay

## 2020-11-26 ENCOUNTER — Other Ambulatory Visit (HOSPITAL_COMMUNITY): Payer: Self-pay

## 2020-11-29 ENCOUNTER — Other Ambulatory Visit (HOSPITAL_COMMUNITY): Payer: Self-pay

## 2021-01-04 ENCOUNTER — Other Ambulatory Visit (HOSPITAL_COMMUNITY): Payer: Self-pay

## 2021-01-04 ENCOUNTER — Ambulatory Visit (INDEPENDENT_AMBULATORY_CARE_PROVIDER_SITE_OTHER): Payer: No Typology Code available for payment source | Admitting: Medical-Surgical

## 2021-01-04 ENCOUNTER — Encounter: Payer: Self-pay | Admitting: Medical-Surgical

## 2021-01-04 ENCOUNTER — Other Ambulatory Visit: Payer: Self-pay

## 2021-01-04 ENCOUNTER — Other Ambulatory Visit (INDEPENDENT_AMBULATORY_CARE_PROVIDER_SITE_OTHER): Payer: Self-pay | Admitting: Otolaryngology

## 2021-01-04 VITALS — BP 122/75 | HR 72 | Temp 98.7°F | Ht 63.0 in | Wt 250.8 lb

## 2021-01-04 DIAGNOSIS — Z7689 Persons encountering health services in other specified circumstances: Secondary | ICD-10-CM | POA: Diagnosis not present

## 2021-01-04 DIAGNOSIS — R635 Abnormal weight gain: Secondary | ICD-10-CM | POA: Diagnosis not present

## 2021-01-04 MED ORDER — SEMAGLUTIDE(0.25 OR 0.5MG/DOS) 2 MG/1.5ML ~~LOC~~ SOPN: 0.2500 mg | PEN_INJECTOR | SUBCUTANEOUS | 0 refills | Status: DC

## 2021-01-04 MED ORDER — OMEPRAZOLE 40 MG PO CPDR
DELAYED_RELEASE_CAPSULE | ORAL | 1 refills | Status: DC
  Filled 2021-01-04: qty 30, 30d supply, fill #0
  Filled 2021-02-17: qty 30, 30d supply, fill #1

## 2021-01-04 MED FILL — Cetirizine HCl Tab 10 MG: ORAL | 90 days supply | Qty: 90 | Fill #0 | Status: AC

## 2021-01-04 NOTE — Progress Notes (Signed)
Subjective:    CC: weight gain  HPI: Pleasant 50 year old female presenting today to discuss weight gain.  She was previously doing weight loss efforts and had some success.  Unfortunately she was unable to tolerate [facility] and our other options did not produce significant results.  She was started on metformin as an off label treatment and was taking this regularly.  Unfortunately, she had significant GI effects from this.  Since her last visit with me in January, she has gained approximately 20 pounds.  Admits that she has been doing a lot of snacking and eating fast food.  She is not doing any exercise at this point but does have a home gym in place.  Reports that she is at an all-time high for her weight and starting to affect her self image.  Does not like to look at herself in the mirror or in pictures anymore.  Saw ENT regarding the itching symptoms that she was having.  After further evaluation, the ENT does not feel her itching was related to [facility].  With this information, she would like to retry Wegovy to see if this will be beneficial for her.  I reviewed the past medical history, family history, social history, surgical history, and allergies today and no changes were needed.  Please see the problem list section below in epic for further details.  Past Medical History: Past Medical History:  Diagnosis Date  . Abnormal mammogram   . Enthesopathy   . H/O degenerative disc disease   . Hypertension    Past Surgical History: Past Surgical History:  Procedure Laterality Date  . ABDOMINAL HYSTERECTOMY    . CARPAL TUNNEL RELEASE    . CESAREAN SECTION    . CYST REMOVAL TRUNK    . ECTOPIC PREGNANCY SURGERY     Social History: Social History   Socioeconomic History  . Marital status: Married    Spouse name: Not on file  . Number of children: Not on file  . Years of education: Not on file  . Highest education level: Not on file  Occupational History  . Not on file  Tobacco Use   . Smoking status: Never Smoker  . Smokeless tobacco: Never Used  Vaping Use  . Vaping Use: Never used  Substance and Sexual Activity  . Alcohol use: Yes    Comment: 1 drink/month  . Drug use: Never  . Sexual activity: Yes    Birth control/protection: Surgical  Other Topics Concern  . Not on file  Social History Narrative  . Not on file   Social Determinants of Health   Financial Resource Strain: Not on file  Food Insecurity: Not on file  Transportation Needs: Not on file  Physical Activity: Not on file  Stress: Not on file  Social Connections: Not on file   Family History: Family History  Problem Relation Age of Onset  . Hypertension Mother   . Hypertension Father   . Prostate cancer Father    Allergies: Allergies  Allergen Reactions  . Imitrex [Sumatriptan]     Causes chest tightness  . Lisinopril Cough   Medications: See med rec.  Review of Systems: See HPI for pertinent positives and negatives.   Objective:    General: Well Developed, well nourished, and in no acute distress.  Neuro: Alert and oriented x3, extra-ocular muscles intact, sensation grossly intact.  HEENT: Normocephalic, atraumatic, pupils equal round reactive to light, neck supple, no masses, no lymphadenopathy, thyroid nonpalpable.  Skin: Warm and dry, no  rashes. Cardiac: Regular rate and rhythm, no murmurs rubs or gallops, no lower extremity edema.  Respiratory: Clear to auscultation bilaterally. Not using accessory muscles, speaking in full sentences.   Impression and Recommendations:    1. Weight gain 2. Encounter for weight management Discussed dieting versus lifestyle changes.  Recommend starting a regular exercise program as well as monitoring dietary intake.  Reduce fast food and aim for a 1500-calorie diet.  Reordering Wegovy at 0.25 mg weekly for 4 weeks.  If this is covered by insurance and she tolerates it, this will likely be a good choice for her.  Because she does have some  behavior issues with snacking and poor dietary choices, referring to medical weight management to discuss further options and interventions. - Amb Ref to Medical Weight Management  Return in about 4 weeks (around 02/01/2021) for weight check. ___________________________________________ Thayer Ohm, DNP, APRN, FNP-BC Primary Care and Sports Medicine [facility] [place]

## 2021-01-06 ENCOUNTER — Encounter: Payer: Self-pay | Admitting: Medical-Surgical

## 2021-02-10 ENCOUNTER — Encounter: Payer: Self-pay | Admitting: Medical-Surgical

## 2021-02-10 ENCOUNTER — Telehealth (INDEPENDENT_AMBULATORY_CARE_PROVIDER_SITE_OTHER): Payer: No Typology Code available for payment source | Admitting: Medical-Surgical

## 2021-02-10 VITALS — Ht 63.0 in | Wt 247.4 lb

## 2021-02-10 DIAGNOSIS — Z7689 Persons encountering health services in other specified circumstances: Secondary | ICD-10-CM

## 2021-02-10 DIAGNOSIS — R635 Abnormal weight gain: Secondary | ICD-10-CM

## 2021-02-10 MED ORDER — TRIAMCINOLONE ACETONIDE 55 MCG/ACT NA AERO: INHALATION_SPRAY | NASAL | 99 refills | Status: DC

## 2021-02-10 MED ORDER — SEMAGLUTIDE-WEIGHT MANAGEMENT 1 MG/0.5ML ~~LOC~~ SOAJ: 1.0000 mg | SUBCUTANEOUS | 0 refills | Status: DC

## 2021-02-10 MED ORDER — SEMAGLUTIDE-WEIGHT MANAGEMENT 0.5 MG/0.5ML ~~LOC~~ SOAJ: 0.5000 mg | SUBCUTANEOUS | 0 refills | Status: AC

## 2021-02-10 MED ORDER — SEMAGLUTIDE-WEIGHT MANAGEMENT 2.4 MG/0.75ML ~~LOC~~ SOAJ: 2.4000 mg | SUBCUTANEOUS | 1 refills | Status: DC

## 2021-02-10 MED ORDER — SEMAGLUTIDE-WEIGHT MANAGEMENT 1.7 MG/0.75ML ~~LOC~~ SOAJ: 1.7000 mg | SUBCUTANEOUS | 0 refills | Status: DC

## 2021-02-10 NOTE — Progress Notes (Signed)
Virtual Visit via Video Note  I connected with Amy Diaz on 02/10/21 at 10:50 AM EDT by a video enabled telemedicine application and verified that I am speaking with the correct person using two identifiers.   I discussed the limitations of evaluation and management by telemedicine and the availability of in person appointments. The patient expressed understanding and agreed to proceed.  Patient location: home Provider locations: office  Subjective:    CC: weight check discussion  HPI: Pleasant 50 year old female presenting via Mychart video visit to discuss weight concerns. Approximately one month ago, we had discussed restarting Wegovy. We started with a small dose at 0.25mg  to evaluate her tolerance since she had previously developed a strange "internal itching" sensation while taking it. She has done 4 weeks of the medication and tolerated it well so far. She is interested in proceeding with the dosing at the next recommended level. She has lost about 3 pounds over the last month. Notes that she has been very busy and had some increased stress since her husband had his knee replacement surgery a few weeks ago. She has been caring for him over the past 2 weeks and just started back to work yesterday. She is counting calories but has not logged her food or evaluated her protein intake.   She does still have some itching but the quality and location has changed. Previously, she had internal itching. Now her itching is external and affects her bilateral forearms and bilateral lower legs from the knee down. She constantly itches and is unable to figure out why. Scratches at night and now has marks on her skin from it. Taking Zyrtec with no relief. Used hydrocortisone cream on it but noted her skin was getting tough so she stopped it.    Past medical history, Surgical history, Family history not pertinant except as noted below, Social history, Allergies, and medications have been entered into the  medical record, reviewed, and corrections made.   Review of Systems: See HPI for pertinent positives and negatives.   Objective:    General: Speaking clearly in complete sentences without any shortness of breath.  Alert and oriented x3.  Normal judgment. No apparent acute distress.  Impression and Recommendations:    1. Weight gain 2. Encounter for weight management 3. Morbid obesity with body mass index (BMI) of 40.0 or higher (HCC) Continue Wegovy. Increasing to 0.5mg  weekly for the next 4 weeks. If unable to contact [facility] and get in with them, follow up in about 4 weeks with me. Referral coordinator notified of no contact with [facility] so she can reach out to assess for any barriers to scheduling. Continue low calorie diet. Recommend logging foods into an app that will track intake of specific macros. Aim for higher protein intake. Increase intentional exercise and work to include strength or resistance training.   I discussed the assessment and treatment plan with the patient. The patient was provided an opportunity to ask questions and all were answered. The patient agreed with the plan and demonstrated an understanding of the instructions.   The patient was advised to call back or seek an in-person evaluation if the symptoms worsen or if the condition fails to improve as anticipated.  20 minutes of non-face-to-face time was provided during this encounter.  Return in about 4 weeks (around 03/10/2021) for weight check.  Thayer Ohm, DNP, APRN, FNP-BC [facility] and Sports Medicine

## 2021-02-14 ENCOUNTER — Encounter: Payer: Self-pay | Admitting: Medical-Surgical

## 2021-02-16 NOTE — Telephone Encounter (Signed)
Received from [employer]: "A prior authorization request for this medication is currently in-progress with [employer]. If you have any questions about this request, please contact [phone]."

## 2021-02-17 ENCOUNTER — Other Ambulatory Visit (HOSPITAL_COMMUNITY): Payer: Self-pay

## 2021-03-03 NOTE — Telephone Encounter (Signed)
We received notification from [employer] yesterday that the [facility] 1.7 mg/0.75 mL has been approved. The will allow a maximum of 3 refills from 03/02/2021 - 06/01/2021.  Pt aware via MyChart message

## 2021-03-22 ENCOUNTER — Other Ambulatory Visit (HOSPITAL_COMMUNITY): Payer: Self-pay

## 2021-03-22 MED ORDER — LOSARTAN POTASSIUM-HCTZ 100-25 MG PO TABS
ORAL_TABLET | ORAL | 1 refills | Status: DC
  Filled 2021-03-22: qty 90, 90d supply, fill #0
  Filled 2021-07-12: qty 90, 90d supply, fill #1

## 2021-03-28 ENCOUNTER — Other Ambulatory Visit: Payer: Self-pay | Admitting: Medical-Surgical

## 2021-04-05 ENCOUNTER — Telehealth (INDEPENDENT_AMBULATORY_CARE_PROVIDER_SITE_OTHER): Payer: Self-pay | Admitting: Medical-Surgical

## 2021-04-05 ENCOUNTER — Other Ambulatory Visit: Payer: Self-pay

## 2021-04-05 DIAGNOSIS — Z5329 Procedure and treatment not carried out because of patient's decision for other reasons: Secondary | ICD-10-CM

## 2021-04-05 NOTE — Progress Notes (Signed)
Called at 1:38, no answer. Direct links sent via text and e-mail.

## 2021-04-06 DIAGNOSIS — Z0289 Encounter for other administrative examinations: Secondary | ICD-10-CM

## 2021-04-06 NOTE — Progress Notes (Signed)
No show for appointment 04/06/2019. Text links, email links sent x 2. Phone calls with voicemails x 2. Attempts to connect via video x 3.   Thayer Ohm, DNP, APRN, FNP-BC [facility] and Sports Medicine

## 2021-04-08 ENCOUNTER — Encounter (INDEPENDENT_AMBULATORY_CARE_PROVIDER_SITE_OTHER): Payer: Self-pay | Admitting: Family Medicine

## 2021-04-08 ENCOUNTER — Ambulatory Visit (INDEPENDENT_AMBULATORY_CARE_PROVIDER_SITE_OTHER): Payer: No Typology Code available for payment source | Admitting: Family Medicine

## 2021-04-08 ENCOUNTER — Other Ambulatory Visit: Payer: Self-pay

## 2021-04-08 VITALS — BP 135/86 | HR 68 | Temp 97.8°F | Ht 63.0 in | Wt 245.0 lb

## 2021-04-08 DIAGNOSIS — R0602 Shortness of breath: Secondary | ICD-10-CM

## 2021-04-08 DIAGNOSIS — Z1331 Encounter for screening for depression: Secondary | ICD-10-CM | POA: Diagnosis not present

## 2021-04-08 DIAGNOSIS — G4700 Insomnia, unspecified: Secondary | ICD-10-CM

## 2021-04-08 DIAGNOSIS — R7303 Prediabetes: Secondary | ICD-10-CM | POA: Diagnosis not present

## 2021-04-08 DIAGNOSIS — Z9189 Other specified personal risk factors, not elsewhere classified: Secondary | ICD-10-CM

## 2021-04-08 DIAGNOSIS — R5383 Other fatigue: Secondary | ICD-10-CM

## 2021-04-08 DIAGNOSIS — I10 Essential (primary) hypertension: Secondary | ICD-10-CM

## 2021-04-08 DIAGNOSIS — Z6841 Body Mass Index (BMI) 40.0 and over, adult: Secondary | ICD-10-CM

## 2021-04-08 NOTE — Progress Notes (Signed)
Chief Complaint:   OBESITY Amy DoveLorra Diaz (MR# [id number]) is a 50 y.o. female who presents for evaluation and treatment of obesity and related comorbidities. Current BMI is Body mass index is 43.4 kg/m. Amy Diaz has been struggling with her weight for many years and has been unsuccessful in either losing weight, maintaining weight loss, or reaching her healthy weight goal.  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 She craves cookies, sweets, chocolate cake. She skips dinner 4 days per week. She drinks calorie beverages. She notes snacking in bed is her worst habit.  Amy Diaz is currently in the action stage of change and ready to dedicate time achieving and maintaining a healthier weight. Amy Diaz is interested in becoming our patient and working on intensive lifestyle modifications including (but not limited to) diet and exercise for weight loss.  Amy Diaz's habits were reviewed today and are as follows: Her family eats meals together, she thinks her family will eat healthier with her, her desired weight loss is 85 lbs, she has been heavy most of her life, she started gaining weight after giving birth to her twins, her heaviest weight ever was 251 pounds, she is a picky eater and doesn't like to eat healthier foods, she has significant food cravings issues, she snacks frequently in the evenings, she skips meals frequently, she is frequently drinking liquids with calories, she frequently makes poor food choices, and she struggles with emotional eating.  Depression Screen Amy Diaz's Food and Mood (modified PHQ-9) score was 9.  Depression screen PHQ 2/9 04/08/2021  Decreased Interest 2  Down, Depressed, Hopeless 0  PHQ - 2 Score 2  Altered sleeping 2  Tired, decreased energy 3  Change in appetite 1  Feeling bad or failure about yourself  1  Trouble concentrating 0  Moving slowly or fidgety/restless 0  Suicidal thoughts 0  PHQ-9 Score 9  Difficult doing work/chores Not difficult at all    Subjective:   1. Other fatigue Amy Diaz admits to daytime somnolence and admits to waking up still tired. Patent has a history of symptoms of daytime fatigue and morning headache. Amy Diaz generally gets 4 hours of sleep per night, and states that she has nightime awakenings. Snoring is present. Apneic episodes are not present. Epworth Sleepiness Score is 10.  2. Shortness of breath on exertion Amy Diaz notes increasing shortness of breath with exercising and seems to be worsening over time with weight gain. She notes getting out of breath sooner with activity than she used to. This has not gotten worse recently. Amy Diaz denies shortness of breath at rest or orthopnea.  3. Essential hypertension Amy Diaz is on metoprolol and losartan. She did not take her medications this morning due to fasting. She notes her blood pressures usually run at 126-130's/80's.  4. Pre-diabetes Amy Diaz tried Ozempic and Saxenda in the past. She is on Wegovy and she notes no emotional eating concerns. She declines a Dr. Dewaine CongerBarker referral this time.  5. Insomnia, unspecified type Amy Diaz uses trazodone as needed. She had 2 sleep studies, and the last one was in 2016. She has no concerns about obstructive sleep apnea.  6. At risk for diabetes mellitus Amy Diaz is at higher than average risk for developing diabetes due to pre-diabetes.   Assessment/Plan:   Orders Placed This Encounter  Procedures   Vitamin B12   Lipid Panel With LDL/HDL Ratio   T3   T4, free   TSH   VITAMIN D 25 Hydroxy (Vit-D Deficiency, Fractures)  Hemoglobin A1c   Insulin, random   Comprehensive metabolic panel   CBC with Differential/Platelet   Folate   EKG 12-Lead    Medications Discontinued During This Encounter  Medication Reason   triamcinolone (NASACORT) 55 MCG/ACT AERO nasal inhaler Error   Semaglutide-Weight Management 1 MG/0.5ML SOAJ Error   Semaglutide-Weight Management 2.4 MG/0.75ML SOAJ Error   losartan-hydrochlorothiazide (HYZAAR)  100-25 MG tablet Error   Fluocinolone Acetonide Body 0.01 % OIL Error   naproxen sodium (ALEVE) 220 MG tablet Error     No orders of the defined types were placed in this encounter.    1. Other fatigue Amy Diaz does feel that her weight is causing her energy to be lower than it should be. Fatigue may be related to obesity, depression or many other causes. Labs will be ordered, and in the meanwhile, Amy Diaz will focus on self care including making healthy food choices, increasing physical activity and focusing on stress reduction.  - EKG 12-Lead - Vitamin B12 - T3 - T4, free - TSH - VITAMIN D 25 Hydroxy (Vit-D Deficiency, Fractures) - CBC with Differential/Platelet - Folate  2. Shortness of breath on exertion Amy Diaz does feel that she gets out of breath more easily that she used to when she exercises. Amy Diaz's shortness of breath appears to be obesity related and exercise induced. She has agreed to work on weight loss and gradually increase exercise to treat her exercise induced shortness of breath. Will continue to monitor closely.  - Lipid Panel With LDL/HDL Ratio  3. Essential hypertension Amy Diaz's blood pressure is high normal range today, but controlled at home. We will check labs today. She will work on her prudent nutritional plan and weight loss.  - Counseled Amy Diaz on pathophysiology of disease and discussed treatment plan, which always includes dietary and lifestyle modification as first line.  - Lifestyle changes such as following our low salt, heart healthy meal plan and engaging in a regular exercise program discussed  - Avoid buying foods that are: processed, frozen, or prepackaged to avoid excess salt. - Ambulatory blood pressure monitoring encouraged.  Reminded patient that if they ever feel poorly in any way, to check their blood pressure and pulse as well. - We will continue to monitor closely alongside PCP/ specialists.  Pt reminded to also f/up with those individuals  as instructed by them.  - We will continue to monitor symptoms as they relate to the her weight loss journey.  - Comprehensive metabolic panel  4. Pre-diabetes We will check labs today. Amy Diaz will continue Wegovy, follow her prudent nutritional plan, and decrease simple carbohydrates.  - I reiterated and again counseled patient on pathophysiology of the disease process of Pre-DM.  - Stressed importance of dietary and lifestyle modifications resulting in weight loss as first line txmnt - in addition we discussed the risks and benefits of various medication options which can help [name] in the management of this disease process as well as with weight loss.  Will consider starting one of these meds in future as will focus on prudent nutritional plan at this time.  - continue to decrease simple carbs; increase fiber and proteins -> follow meal plan  - handouts provided at pt's request after education provided.  All concerns/questions addressed.   - anticipatory guidance given.   - Recheck A1c and fasting insulin level in approximately 3 months from last check or as deemed fit.   - Hemoglobin A1c - Insulin, random - Comprehensive metabolic panel  5. Insomnia, unspecified  type The problem of recurrent insomnia was discussed. Orders and follow up as documented in patient record. Counseling: Intensive lifestyle modifications are the first line treatment for this issue. We discussed several lifestyle modifications today. Amy Diaz will continue to work on diet, exercise and weight loss efforts.   Counseling Limit or avoid alcohol, caffeinated beverages, and cigarettes, especially close to bedtime.  Do not eat a large meal or eat spicy foods right before bedtime. This can lead to digestive discomfort that can make it hard for you to sleep. Keep a sleep diary to help you and your health care provider figure out what could be causing your insomnia.  Make your bedroom a dark, comfortable place where it is easy  to fall asleep. Put up shades or blackout curtains to block light from outside. Use a white noise machine to block noise. Keep the temperature cool. Limit screen use before bedtime. This includes: Watching TV. Using your smartphone, tablet, or computer. Stick to a routine that includes going to bed and waking up at the same times every day and night. This can help you fall asleep faster. Consider making a quiet activity, such as reading, part of your nighttime routine. Try to avoid taking naps during the day so that you sleep better at night. Get out of bed if you are still awake after 15 minutes of trying to sleep. Keep the lights down, but try reading or doing a quiet activity. When you feel sleepy, go back to bed.  6. Depression screening Amy Diaz had a positive depression screening. Depression is commonly associated with obesity and often results in emotional eating behaviors. We will monitor this closely and work on CBT to help improve the non-hunger eating patterns. Referral to Psychology may be required if no improvement is seen as she continues in our clinic.  7. At risk for diabetes mellitus - Amy Diaz was given diabetes prevention education and counseling today of more than 10 minutes.  - Counseled patient on pathophysiology of disease and meaning/ implication of lab results.  - Reviewed how certain foods can either stimulate or inhibit insulin release, and subsequently affect hunger pathways  - Importance of following a healthy meal plan with limiting amounts of simple carbohydrates discussed with patient - Effects of regular aerobic exercise on blood sugar regulation reviewed and encouraged an eventual goal of 30 min 5d/week or more as a minimum.  - Briefly discussed treatment options, which always include dietary and lifestyle modification as first line.   - Handouts provided at patient's desire and/or told to go online to the American Diabetes Association website for further  information.  8. Class 3 severe obesity with serious comorbidity and body mass index (BMI) of 40.0 to 44.9 in adult, unspecified obesity type (HCC) Amy Diaz is currently in the action stage of change and her goal is to continue with weight loss efforts. I recommend Amy Diaz begin the structured treatment plan as follows:  She has agreed to the Category 1 Plan.  Exercise goals: As is.   Behavioral modification strategies: increasing lean protein intake, decreasing simple carbohydrates, meal planning and cooking strategies, and planning for success.  She was informed of the importance of frequent follow-up visits to maximize her success with intensive lifestyle modifications for her multiple health conditions. She was informed we would discuss her lab results at her next visit unless there is a critical issue that needs to be addressed sooner. Amy Diaz agreed to keep her next visit at the agreed upon time to discuss these  results.  Objective:   Blood pressure 135/86, pulse 68, temperature 97.8 F (36.6 C), height 5\' 3"  (1.6 m), weight 245 lb (111.1 kg), SpO2 100 %. Body mass index is 43.4 kg/m.  EKG: Normal sinus rhythm, rate 69 BPM.  Indirect Calorimeter completed today shows a VO2 of 205 and a REE of 1411.  Her calculated basal metabolic rate is thus her basal metabolic rate is worse than expected.  General: Cooperative, alert, well developed, in no acute distress. HEENT: Conjunctivae and lids unremarkable. Cardiovascular: Regular rhythm.  Lungs: Normal work of breathing. Neurologic: No focal deficits.   Lab Results  Component Value Date   CREATININE 0.98 06/09/2020   BUN 11 06/09/2020   NA 139 06/09/2020   K 4.3 06/09/2020   CL 102 06/09/2020   CO2 31 06/09/2020   Lab Results  Component Value Date   ALT 23 06/09/2020   AST 18 06/09/2020   BILITOT 0.4 06/09/2020   Lab Results  Component Value Date   HGBA1C 5.7 (H) 06/09/2020   HGBA1C 5.8 (H) 10/29/2019   No results found  for: INSULIN Lab Results  Component Value Date   TSH 0.95 06/09/2020   No results found for: CHOL, HDL, LDLCALC, LDLDIRECT, TRIG, CHOLHDL Lab Results  Component Value Date   WBC 4.8 06/09/2020   HGB 13.3 06/09/2020   HCT 38.4 06/09/2020   MCV 87.3 06/09/2020   PLT 397 06/09/2020   No results found for: IRON, TIBC, FERRITIN  Attestation Statements:   Reviewed by clinician on day of visit: allergies, medications, problem list, medical history, surgical history, family history, social history, and previous encounter notes.   13/04/2020, am acting as transcriptionist for Trude Mcburney, DO.  I have reviewed the above documentation for accuracy and completeness, and I agree with the above. [employer], D.O.  The 21st Century Cures Act was signed into law in 2016 which includes the topic of electronic health records.  This provides immediate access to information in MyChart.  This includes consultation notes, operative notes, office notes, lab results and pathology reports.  If you have any questions about what you read please let 2017 know at your next visit so we can discuss your concerns and take corrective action if need be.  We are right here with you.

## 2021-04-12 ENCOUNTER — Other Ambulatory Visit (HOSPITAL_COMMUNITY): Payer: Self-pay

## 2021-04-12 ENCOUNTER — Other Ambulatory Visit: Payer: Self-pay | Admitting: Medical-Surgical

## 2021-04-12 ENCOUNTER — Other Ambulatory Visit (INDEPENDENT_AMBULATORY_CARE_PROVIDER_SITE_OTHER): Payer: Self-pay | Admitting: Otolaryngology

## 2021-04-12 MED ORDER — FAMOTIDINE 20 MG PO TABS
ORAL_TABLET | Freq: Every day | ORAL | 0 refills | Status: DC
  Filled 2021-04-12: qty 30, 30d supply, fill #0

## 2021-04-12 MED ORDER — CETIRIZINE HCL 10 MG PO TABS
ORAL_TABLET | Freq: Every day | ORAL | 0 refills | Status: DC
  Filled 2021-04-12: qty 30, 30d supply, fill #0

## 2021-04-14 ENCOUNTER — Other Ambulatory Visit (INDEPENDENT_AMBULATORY_CARE_PROVIDER_SITE_OTHER): Payer: Self-pay | Admitting: Otolaryngology

## 2021-04-14 ENCOUNTER — Other Ambulatory Visit (HOSPITAL_COMMUNITY): Payer: Self-pay

## 2021-04-18 ENCOUNTER — Other Ambulatory Visit (HOSPITAL_COMMUNITY): Payer: Self-pay

## 2021-04-19 ENCOUNTER — Other Ambulatory Visit (HOSPITAL_COMMUNITY): Payer: Self-pay

## 2021-04-20 ENCOUNTER — Other Ambulatory Visit (INDEPENDENT_AMBULATORY_CARE_PROVIDER_SITE_OTHER): Payer: Self-pay | Admitting: Otolaryngology

## 2021-04-20 ENCOUNTER — Other Ambulatory Visit (HOSPITAL_COMMUNITY): Payer: Self-pay

## 2021-04-21 ENCOUNTER — Other Ambulatory Visit (HOSPITAL_COMMUNITY): Payer: Self-pay

## 2021-04-21 LAB — CBC WITH DIFFERENTIAL/PLATELET
Hematocrit: 42.6 % (ref 34.0–46.6)
Hemoglobin: 13.4 g/dL (ref 11.1–15.9)
MCH: 29.6 pg (ref 26.6–33.0)
MCHC: 31.5 g/dL (ref 31.5–35.7)
MCV: 94 fL (ref 79–97)
Platelets: 364 10*3/uL (ref 150–450)
RBC: 4.53 x10E6/uL (ref 3.77–5.28)
RDW: 13.8 % (ref 11.7–15.4)
WBC: 5 10*3/uL (ref 3.4–10.8)

## 2021-04-21 LAB — COMPREHENSIVE METABOLIC PANEL
ALT: 18 IU/L (ref 0–32)
AST: 17 IU/L (ref 0–40)
Albumin/Globulin Ratio: 1.5 (ref 1.2–2.2)
Albumin: 4.3 g/dL (ref 3.8–4.9)
Alkaline Phosphatase: 72 IU/L (ref 44–121)
BUN/Creatinine Ratio: 13 (ref 9–23)
BUN: 12 mg/dL (ref 6–24)
Bilirubin Total: 0.3 mg/dL (ref 0.0–1.2)
CO2: 27 mmol/L (ref 20–29)
Calcium: 10 mg/dL (ref 8.7–10.2)
Chloride: 100 mmol/L (ref 96–106)
Creatinine, Ser: 0.89 mg/dL (ref 0.57–1.00)
Globulin, Total: 2.8 g/dL (ref 1.5–4.5)
Glucose: 82 mg/dL (ref 65–99)
Potassium: 4.1 mmol/L (ref 3.5–5.2)
Sodium: 141 mmol/L (ref 134–144)
Total Protein: 7.1 g/dL (ref 6.0–8.5)
eGFR: 78 mL/min/{1.73_m2} (ref >59)

## 2021-04-21 LAB — SPECIMEN STATUS REPORT

## 2021-04-21 LAB — LIPID PANEL WITH LDL/HDL RATIO
Cholesterol, Total: 242 mg/dL — ABNORMAL HIGH (ref 100–199)
HDL: 76 mg/dL (ref >39)
LDL Chol Calc (NIH): 149 mg/dL — ABNORMAL HIGH (ref 0–99)
LDL/HDL Ratio: 2 ratio (ref 0.0–3.2)
Triglycerides: 99 mg/dL (ref 0–149)
VLDL Cholesterol Cal: 17 mg/dL (ref 5–40)

## 2021-04-21 LAB — TSH: TSH: 1.23 u[IU]/mL (ref 0.450–4.500)

## 2021-04-21 LAB — HEMOGLOBIN A1C
Est. average glucose Bld gHb Est-mCnc: 120 mg/dL
Hgb A1c MFr Bld: 5.8 % — ABNORMAL HIGH (ref 4.8–5.6)

## 2021-04-21 LAB — VITAMIN D 25 HYDROXY (VIT D DEFICIENCY, FRACTURES): Vit D, 25-Hydroxy: 16.5 ng/mL — ABNORMAL LOW (ref 30.0–100.0)

## 2021-04-21 LAB — VITAMIN B12: Vitamin B-12: 431 pg/mL (ref 232–1245)

## 2021-04-21 LAB — FOLATE: Folate: 12.4 ng/mL (ref >3.0)

## 2021-04-21 LAB — T4, FREE: Free T4: 1.43 ng/dL (ref 0.82–1.77)

## 2021-04-21 LAB — T3: T3, Total: 123 ng/dL (ref 71–180)

## 2021-04-21 LAB — INSULIN, RANDOM: INSULIN: 19.6 u[IU]/mL (ref 2.6–24.9)

## 2021-04-22 ENCOUNTER — Other Ambulatory Visit (HOSPITAL_COMMUNITY): Payer: Self-pay

## 2021-04-22 ENCOUNTER — Telehealth (INDEPENDENT_AMBULATORY_CARE_PROVIDER_SITE_OTHER): Payer: No Typology Code available for payment source | Admitting: Medical-Surgical

## 2021-04-22 ENCOUNTER — Encounter (INDEPENDENT_AMBULATORY_CARE_PROVIDER_SITE_OTHER): Payer: Self-pay

## 2021-04-22 ENCOUNTER — Other Ambulatory Visit: Payer: Self-pay

## 2021-04-22 ENCOUNTER — Encounter (INDEPENDENT_AMBULATORY_CARE_PROVIDER_SITE_OTHER): Payer: Self-pay | Admitting: Family Medicine

## 2021-04-22 ENCOUNTER — Encounter (INDEPENDENT_AMBULATORY_CARE_PROVIDER_SITE_OTHER): Payer: No Typology Code available for payment source | Admitting: Family Medicine

## 2021-04-22 ENCOUNTER — Encounter: Payer: Self-pay | Admitting: Medical-Surgical

## 2021-04-22 VITALS — Wt 241.2 lb

## 2021-04-22 DIAGNOSIS — Z7689 Persons encountering health services in other specified circumstances: Secondary | ICD-10-CM

## 2021-04-22 DIAGNOSIS — G4700 Insomnia, unspecified: Secondary | ICD-10-CM

## 2021-04-22 DIAGNOSIS — K219 Gastro-esophageal reflux disease without esophagitis: Secondary | ICD-10-CM | POA: Diagnosis not present

## 2021-04-22 MED ORDER — TRAZODONE HCL 50 MG PO TABS: 25.0000 mg | ORAL_TABLET | Freq: Every evening | ORAL | 1 refills | Status: DC | PRN

## 2021-04-22 MED FILL — Omeprazole Cap Delayed Release 40 MG: ORAL | 90 days supply | Qty: 90 | Fill #0 | Status: AC

## 2021-04-22 NOTE — Progress Notes (Signed)
Virtual Visit via Video Note  I connected with Amy Diaz on 04/22/21 at  9:10 AM EDT by a video enabled telemedicine application and verified that I am speaking with the correct person using two identifiers.   I discussed the limitations of evaluation and management by telemedicine and the availability of in person appointments. The patient expressed understanding and agreed to proceed.  Patient location: home Provider locations: office  Subjective:    CC: weight check  HPI: Pleasant 50 year old female presenting via MyChart video visit for a weight check. Has been having trouble getting Wegovy due to supply issues. Was able to get her 1.7mg  weekly dose about 2 weeks ago. Has been taking the medication as prescribed, tolerating well. Notes a decreased appetite. Has been drinking only water for the last 2 weeks. Is not currently exercising but has a plan with a coworker/friend to start going to the gym at her complex. Does have 2 stationary bikes at home but they are in the basement and she doesn't like to go down there. Was finally able to get in with [facility] and had her first appointment 2 weeks ago. Has an appointment today at 10:40am for a follow up with them.   Requesting a refill on Omeprazole. Talked to her pharmacy and was told they had to contact the provider. Has had no response since then. Notes the medication helps with reflux and the cough she had been dealing with.   Would like a refill on Trazodone as well. Takes the medication prn and reports it works well for her when she has trouble sleeping.   Past medical history, Surgical history, Family history not pertinant except as noted below, Social history, Allergies, and medications have been entered into the medical record, reviewed, and corrections made.   Review of Systems: See HPI for pertinent positives and negatives.   Objective:    General: Speaking clearly in complete sentences without any shortness of breath.  Alert and  oriented x3.  Normal judgment. No apparent acute distress.  Impression and Recommendations:    1. Encounter for weight management Doing well on Wegovy. Discussed intentional exercise recommendations. Reviewed options for high protein meals that will fit within her nutritional limitations. Will be followed by [facility] for further weight loss management.   2. Insomnia, unspecified type Controlled. Continue Trazodone.  - traZODone (DESYREL) 50 MG tablet; Take 0.5-1 tablets (25-50 mg total) by mouth at bedtime as needed for sleep.  Dispense: 90 tablet; Refill: 1  3. Gastroesophageal reflux disease without esophagitis Stable. Refilling Omeprazole.   I discussed the assessment and treatment plan with the patient. The patient was provided an opportunity to ask questions and all were answered. The patient agreed with the plan and demonstrated an understanding of the instructions.   The patient was advised to call back or seek an in-person evaluation if the symptoms worsen or if the condition fails to improve as anticipated.  20 minutes of non-face-to-face time was provided during this encounter.  Return for annual physical exam at your convenience.  Thayer Ohm, DNP, APRN, FNP-BC [facility]

## 2021-04-26 ENCOUNTER — Other Ambulatory Visit (HOSPITAL_COMMUNITY): Payer: Self-pay

## 2021-04-27 ENCOUNTER — Encounter (INDEPENDENT_AMBULATORY_CARE_PROVIDER_SITE_OTHER): Payer: Self-pay | Admitting: Family Medicine

## 2021-04-27 ENCOUNTER — Other Ambulatory Visit: Payer: Self-pay

## 2021-04-27 ENCOUNTER — Other Ambulatory Visit (HOSPITAL_COMMUNITY): Payer: Self-pay

## 2021-04-27 ENCOUNTER — Ambulatory Visit (INDEPENDENT_AMBULATORY_CARE_PROVIDER_SITE_OTHER): Payer: No Typology Code available for payment source | Admitting: Family Medicine

## 2021-04-27 VITALS — BP 129/86 | HR 74 | Temp 98.4°F | Ht 63.0 in | Wt 242.0 lb

## 2021-04-27 DIAGNOSIS — R7303 Prediabetes: Secondary | ICD-10-CM

## 2021-04-27 DIAGNOSIS — E7849 Other hyperlipidemia: Secondary | ICD-10-CM

## 2021-04-27 DIAGNOSIS — Z6841 Body Mass Index (BMI) 40.0 and over, adult: Secondary | ICD-10-CM

## 2021-04-27 DIAGNOSIS — I10 Essential (primary) hypertension: Secondary | ICD-10-CM | POA: Diagnosis not present

## 2021-04-27 DIAGNOSIS — E66813 Obesity, class 3: Secondary | ICD-10-CM

## 2021-04-27 DIAGNOSIS — Z9189 Other specified personal risk factors, not elsewhere classified: Secondary | ICD-10-CM | POA: Diagnosis not present

## 2021-04-27 DIAGNOSIS — E559 Vitamin D deficiency, unspecified: Secondary | ICD-10-CM | POA: Diagnosis not present

## 2021-04-27 MED ORDER — VITAMIN D (ERGOCALCIFEROL) 1.25 MG (50000 UNIT) PO CAPS
50000.0000 [IU] | ORAL_CAPSULE | ORAL | 0 refills | Status: DC
  Filled 2021-04-27: qty 4, 28d supply, fill #0

## 2021-04-27 NOTE — Patient Instructions (Signed)
The 10-year ASCVD risk score (Arnett DK, et al., 2019) is: 3%   Values used to calculate the score:     Age: 50 years     Sex: Female     Is Non-Hispanic African American: Yes     Diabetic: No     Tobacco smoker: No     Systolic Blood Pressure: 129 mmHg     Is BP treated: Yes     HDL Cholesterol: 76 mg/dL     Total Cholesterol: 242 mg/dL

## 2021-04-28 NOTE — Progress Notes (Signed)
Chief Complaint:   OBESITY Amy Diaz is here to discuss her progress with her obesity treatment plan along with follow-up of her obesity related diagnoses. Amy Diaz is on the Category 1 Plan and states she is following her eating plan approximately 50% of the time. Amy Diaz states she is not currently exercising.  Today's visit was #: 2 Starting weight: 245 lbs Starting date: 04/08/2021 Today's weight: 242 lbs Today's date: 04/27/2021 Total lbs lost to date: 3 Total lbs lost since last in-office visit: 3  Interim History: Amy Diaz is here today for her first follow-up office visit since starting the program with Amy Diaz.  All blood work/ lab tests that were recently ordered by myself or an outside provider were reviewed with patient today per their request.   Extended time was spent counseling her on all new disease processes that were discovered or preexisting ones that are affected by BMI.  she understands that many of these abnormalities will need to monitored regularly along with the current treatment plan of prudent dietary changes, in which we are making each and every office visit, to improve these health parameters. [name] has been having much more that the allotted amount of vegetables and snack calories. She has several questions about the plan, which were all answered and several things were clarified. She though the meal plan was "a guide" and didn't know she was suppose to eat all those items. She is eating several 100 calore snack items per day.  Subjective:   1. Essential hypertension Discussed labs with patient today. Not at goal. Asymptomatic. Caprice denies headache, visual changes, or dizziness. Medication: Toprol, Hyzaar  2. Pre-diabetes Worsening. Discussed labs with patient today. Amy Diaz's A1c was previously 5.7 and is now 5.8. She doesn't know why it is worse since she has been on Wegovy.  3. Other hyperlipidemia New. Discussed labs with patient today. Amy Diaz has  hyperlipidemia and has been trying to improve her cholesterol levels with intensive lifestyle modification including a low saturated fat diet, exercise and weight loss. She denies any chest pain, claudication or myalgias. Medication: None  Lab Results  Component Value Date   ALT 18 04/11/2021   AST 17 04/11/2021   ALKPHOS 72 04/11/2021   BILITOT 0.3 04/11/2021   Lab Results  Component Value Date   CHOL 242 (H) 04/11/2021   HDL 76 04/11/2021   LDLCALC 149 (H) 04/11/2021   TRIG 99 04/11/2021   The 10-year ASCVD risk score (Arnett DK, et al., 2019) is: 3%   Values used to calculate the score:     Age: 55 years     Sex: Female     Is Non-Hispanic African American: Yes     Diabetic: No     Tobacco smoker: No     Systolic Blood Pressure: 129 mmHg     Is BP treated: Yes     HDL Cholesterol: 76 mg/dL     Total Cholesterol: 242 mg/dL  4. Vitamin D deficiency New. Discussed labs with patient today. She is currently taking no vitamin D supplement. She denies nausea, vomiting or muscle weakness.  Lab Results  Component Value Date   VD25OH 16.5 (L) 04/11/2021   5. At risk for diabetes mellitus Amy Diaz is at higher than average risk for developing diabetes due to obesity.   Assessment/Plan:  No orders of the defined types were placed in this encounter.   There are no discontinued medications.   Meds ordered this encounter  Medications   Vitamin D,  Ergocalciferol, (DRISDOL) 1.25 MG (50000 UNIT) CAPS capsule    Sig: Take 1 capsule by mouth every 7 (seven) days.    Dispense:  4 capsule    Refill:  0    30 d supply;  ** OV for RF **   Do not send RF request     1. Essential hypertension Amy Diaz is working on healthy weight loss and exercise to improve blood pressure control. We will watch for signs of hypotension as she continues her lifestyle modifications. Continue to decrease salt, prudent nutritional plan, and weight loss. Continue current medications for now. We will follow  closely.  2. Pre-diabetes Amy Diaz will continue to work on weight loss, exercise, and decreasing simple carbohydrates to help decrease the risk of diabetes. Long discussion with pt regarding her need to follow our dietary recommendations and cut back on carbohydrates per meal plan. Handouts provided and extensive counseling done.  3. Other hyperlipidemia Cardiovascular risk and specific lipid/LDL goals reviewed.  We discussed several lifestyle modifications today and Amy Diaz will continue to work on diet, exercise and weight loss efforts. Orders and follow up as documented in patient record. Continue prudent nutritional plan with decrease in saturated/trans fats. Education done regarding the importance of lifestyle modifications.  Counseling Intensive lifestyle modifications are the first line treatment for this issue. Dietary changes: Increase soluble fiber. Decrease simple carbohydrates. Exercise changes: Moderate to vigorous-intensity aerobic activity 150 minutes per week if tolerated. Lipid-lowering medications: see documented in medical record.  4. Vitamin D deficiency Plan: - Discussed importance of vitamin D to their health and well-being.  - possible symptoms of low Vitamin D can be low energy, depressed mood, muscle aches, joint aches, osteoporosis etc. - low Vitamin D levels may be linked to an increased risk of cardiovascular events and even increased risk of cancers- such as colon and breast.  - I recommend pt take a 50,000 IU weekly prescription vit D - see script below   - Informed patient this may be a lifelong thing, and she was encouraged to continue to take the medicine until told otherwise.   - we will need to monitor levels regularly (every 3-4 mo on average) to keep levels within normal limits.  - weight loss will likely improve availability of vitamin D, thus encouraged Amy Diaz to continue with meal plan and their weight loss efforts to further improve this condition - pt's  questions and concerns regarding this condition addressed.  Start- Vitamin D, Ergocalciferol, (DRISDOL) 1.25 MG (50000 UNIT) CAPS capsule; Take 1 capsule by mouth every 7 (seven) days.  Dispense: 4 capsule; Refill: 0  5. At risk for diabetes mellitus - Geryl was given diabetes prevention education and counseling today of more than 23 minutes.  - Counseled patient on pathophysiology of disease and meaning/ implication of lab results.  - Reviewed how certain foods can either stimulate or inhibit insulin release, and subsequently affect hunger pathways  - Importance of following a healthy meal plan with limiting amounts of simple carbohydrates discussed with patient - Effects of regular aerobic exercise on blood sugar regulation reviewed and encouraged an eventual goal of 30 min 5d/week or more as a minimum.  - Briefly discussed treatment options, which always include dietary and lifestyle modification as first line.   - Handouts provided at patient's desire and/or told to go online to the American Diabetes Association website for further information.  6. Obesity with current BMI of 42.9  Britt is currently in the action stage of change. As such,  her goal is to continue with weight loss efforts. She has agreed to the Category 1 Plan.   Exercise goals:  As is for now.  Behavioral modification strategies: increasing lean protein intake, decreasing simple carbohydrates, meal planning and cooking strategies, keeping healthy foods in the home, better snacking choices, and avoiding temptations.  Faylinn has agreed to follow-up with our clinic in 2 weeks. She was informed of the importance of frequent follow-up visits to maximize her success with intensive lifestyle modifications for her multiple health conditions.   Objective:   Blood pressure 129/86, pulse 74, temperature 98.4 F (36.9 C), height 5\' 3"  (1.6 m), weight 242 lb (109.8 kg), SpO2 99 %. Body mass index is 42.87 kg/m.  General:  Cooperative, alert, well developed, in no acute distress. HEENT: Conjunctivae and lids unremarkable. Cardiovascular: Regular rhythm.  Lungs: Normal work of breathing. Neurologic: No focal deficits.   Lab Results  Component Value Date   CREATININE 0.89 04/11/2021   BUN 12 04/11/2021   NA 141 04/11/2021   K 4.1 04/11/2021   CL 100 04/11/2021   CO2 27 04/11/2021   Lab Results  Component Value Date   ALT 18 04/11/2021   AST 17 04/11/2021   ALKPHOS 72 04/11/2021   BILITOT 0.3 04/11/2021   Lab Results  Component Value Date   HGBA1C 5.8 (H) 04/11/2021   HGBA1C 5.7 (H) 06/09/2020   HGBA1C 5.8 (H) 10/29/2019   Lab Results  Component Value Date   INSULIN 19.6 04/11/2021   Lab Results  Component Value Date   TSH 1.230 04/11/2021   Lab Results  Component Value Date   CHOL 242 (H) 04/11/2021   HDL 76 04/11/2021   LDLCALC 149 (H) 04/11/2021   TRIG 99 04/11/2021   Lab Results  Component Value Date   VD25OH 16.5 (L) 04/11/2021   Lab Results  Component Value Date   WBC 5.0 04/11/2021   HGB 13.4 04/11/2021   HCT 42.6 04/11/2021   MCV 94 04/11/2021   PLT 364 04/11/2021    Attestation Statements:   Reviewed by clinician on day of visit: allergies, medications, problem list, medical history, surgical history, family history, social history, and previous encounter notes.  06/11/2021, CMA, am acting as transcriptionist for Edmund Hilda, DO.  I have reviewed the above documentation for accuracy and completeness, and I agree with the above. [employer], D.O.  The 21st Century Cures Act was signed into law in 2016 which includes the topic of electronic health records.  This provides immediate access to information in MyChart.  This includes consultation notes, operative notes, office notes, lab results and pathology reports.  If you have any questions about what you read please let 2017 know at your next visit so we can discuss your concerns and take corrective  action if need be.  We are right here with you.

## 2021-04-29 NOTE — Progress Notes (Signed)
err

## 2021-05-02 ENCOUNTER — Other Ambulatory Visit (HOSPITAL_COMMUNITY): Payer: Self-pay

## 2021-05-05 ENCOUNTER — Other Ambulatory Visit (HOSPITAL_COMMUNITY): Payer: Self-pay

## 2021-05-06 ENCOUNTER — Other Ambulatory Visit: Payer: Self-pay | Admitting: Medical-Surgical

## 2021-05-06 ENCOUNTER — Encounter: Payer: Self-pay | Admitting: Medical-Surgical

## 2021-05-06 MED ORDER — WEGOVY 1.7 MG/0.75ML ~~LOC~~ SOAJ
1.7000 mg | SUBCUTANEOUS | 0 refills | Status: DC
Start: 2021-05-06 — End: 2021-05-25

## 2021-05-06 NOTE — Telephone Encounter (Signed)
Is patient to continue this medication?  Last OV note stated "Will be followed by [facility] for further weight loss management".  Please review request and authorize if appropriate.  Tiajuana Amass, CMA

## 2021-05-10 ENCOUNTER — Other Ambulatory Visit (HOSPITAL_COMMUNITY): Payer: Self-pay

## 2021-05-11 ENCOUNTER — Ambulatory Visit (INDEPENDENT_AMBULATORY_CARE_PROVIDER_SITE_OTHER): Payer: No Typology Code available for payment source | Admitting: Bariatrics

## 2021-05-12 ENCOUNTER — Other Ambulatory Visit (HOSPITAL_COMMUNITY): Payer: Self-pay

## 2021-05-25 ENCOUNTER — Other Ambulatory Visit: Payer: Self-pay

## 2021-05-25 ENCOUNTER — Ambulatory Visit (INDEPENDENT_AMBULATORY_CARE_PROVIDER_SITE_OTHER): Payer: No Typology Code available for payment source | Admitting: Family Medicine

## 2021-05-25 ENCOUNTER — Encounter (INDEPENDENT_AMBULATORY_CARE_PROVIDER_SITE_OTHER): Payer: Self-pay | Admitting: Family Medicine

## 2021-05-25 VITALS — BP 131/85 | HR 73 | Temp 97.6°F | Ht 63.0 in | Wt 242.0 lb

## 2021-05-25 DIAGNOSIS — Z9189 Other specified personal risk factors, not elsewhere classified: Secondary | ICD-10-CM | POA: Diagnosis not present

## 2021-05-25 DIAGNOSIS — E559 Vitamin D deficiency, unspecified: Secondary | ICD-10-CM | POA: Diagnosis not present

## 2021-05-25 DIAGNOSIS — Z6841 Body Mass Index (BMI) 40.0 and over, adult: Secondary | ICD-10-CM

## 2021-05-25 DIAGNOSIS — R7303 Prediabetes: Secondary | ICD-10-CM | POA: Diagnosis not present

## 2021-05-25 MED ORDER — WEGOVY 1.7 MG/0.75ML ~~LOC~~ SOAJ: 1.7000 mg | SUBCUTANEOUS | 0 refills | Status: DC

## 2021-05-25 MED ORDER — VITAMIN D (ERGOCALCIFEROL) 1.25 MG (50000 UNIT) PO CAPS: 50000.0000 [IU] | ORAL_CAPSULE | ORAL | 0 refills | Status: DC

## 2021-05-25 NOTE — Progress Notes (Signed)
Chief Complaint:   OBESITY Amy Diaz is here to discuss her progress with her obesity treatment plan along with follow-up of her obesity related diagnoses. Amy Diaz is on the Category 1 Plan and states she is following her eating plan approximately 30-40% of the time. Amy Diaz states she is not currently exercising.  Today's visit was #: 3 Starting weight: 245 lbs Starting date: 04/08/2021 Today's weight: 242 lbs Today's date: 05/25/2021 Total lbs lost to date: 3 Total lbs lost since last in-office visit: 0  Interim History: Amy Diaz voices that she feels bored with meals, especially breakfast. She is not meal prepping and is skipping meals, usually breakfast and/or dinner.   Subjective:   1. Vitamin D deficiency She is currently taking prescription vitamin D 50,000 IU each week. She denies nausea, vomiting or muscle weakness. She is tolerating medication well with no issues.  Lab Results  Component Value Date   VD25OH 16.5 (L) 04/11/2021   2. Pre-diabetes Amy Diaz has a diagnosis of prediabetes based on her elevated HgA1c and was informed this puts her at greater risk of developing diabetes. She continues to work on diet and exercise to decrease her risk of diabetes. She denies nausea or hypoglycemia.  Lab Results  Component Value Date   HGBA1C 5.8 (H) 04/11/2021   Lab Results  Component Value Date   INSULIN 19.6 04/11/2021   3. At risk for deficient intake of food The patient is at a higher than average risk of deficient intake of food due to poor intake.  Assessment/Plan:  No orders of the defined types were placed in this encounter.   Medications Discontinued During This Encounter  Medication Reason   Vitamin D, Ergocalciferol, (DRISDOL) 1.25 MG (50000 UNIT) CAPS capsule Reorder   Semaglutide-Weight Management (WEGOVY) 1.7 MG/0.75ML SOAJ Reorder     Meds ordered this encounter  Medications   Vitamin D, Ergocalciferol, (DRISDOL) 1.25 MG (50000 UNIT) CAPS capsule    Sig:  Take 1 capsule by mouth every 7 (seven) days.    Dispense:  4 capsule    Refill:  0    30 d supply;  ** OV for RF **   Do not send RF request   Semaglutide-Weight Management (WEGOVY) 1.7 MG/0.75ML SOAJ    Sig: Inject 1.7 mg into the skin once a week.    Dispense:  3 mL    Refill:  0    30 d supply;  ** OV for RF **   Do not send RF request     1. Vitamin D deficiency Low Vitamin D level contributes to fatigue and are associated with obesity, breast, and colon cancer. She agrees to continue to take prescription Vitamin D 50,000 IU every week and will follow-up for routine testing of Vitamin D, at least 2-3 times per year to avoid over-replacement.  Refill- Vitamin D, Ergocalciferol, (DRISDOL) 1.25 MG (50000 UNIT) CAPS capsule; Take 1 capsule by mouth every 7 (seven) days.  Dispense: 4 capsule; Refill: 0  2. Pre-diabetes Amy Diaz will continue to work on weight loss, exercise, and decreasing simple carbohydrates to help decrease the risk of diabetes. Continue Wegovy with no change in dose yet.  Refill- Semaglutide-Weight Management (WEGOVY) 1.7 MG/0.75ML SOAJ; Inject 1.7 mg into the skin once a week.  Dispense: 3 mL; Refill: 0  3. At risk for deficient intake of food Amy Diaz was given approximately 9 minutes of deficit intake of food prevention counseling today. Amy Diaz is at risk for eating too few calories based on  current food recall. She was encouraged to focus on meeting caloric and protein goals according to her recommended meal plan.    4. Obesity with current BMI of 42.9  Amy Diaz is currently in the action stage of change. As such, her goal is to continue with weight loss efforts. She has agreed to change to the Category 1 Plan with breakfast options.   Focus on eating more foods on plan.  Exercise goals:  As is  Behavioral modification strategies: no skipping meals, meal planning and cooking strategies, and planning for success.  Amy Diaz has agreed to follow-up with our clinic in 2  weeks. She was informed of the importance of frequent follow-up visits to maximize her success with intensive lifestyle modifications for her multiple health conditions.   Objective:   Blood pressure 131/85, pulse 73, temperature 97.6 F (36.4 C), height 5\' 3"  (1.6 m), weight 242 lb (109.8 kg), SpO2 100 %. Body mass index is 42.87 kg/m.  General: Cooperative, alert, well developed, in no acute distress. HEENT: Conjunctivae and lids unremarkable. Cardiovascular: Regular rhythm.  Lungs: Normal work of breathing. Neurologic: No focal deficits.   Lab Results  Component Value Date   CREATININE 0.89 04/11/2021   BUN 12 04/11/2021   NA 141 04/11/2021   K 4.1 04/11/2021   CL 100 04/11/2021   CO2 27 04/11/2021   Lab Results  Component Value Date   ALT 18 04/11/2021   AST 17 04/11/2021   ALKPHOS 72 04/11/2021   BILITOT 0.3 04/11/2021   Lab Results  Component Value Date   HGBA1C 5.8 (H) 04/11/2021   HGBA1C 5.7 (H) 06/09/2020   HGBA1C 5.8 (H) 10/29/2019   Lab Results  Component Value Date   INSULIN 19.6 04/11/2021   Lab Results  Component Value Date   TSH 1.230 04/11/2021   Lab Results  Component Value Date   CHOL 242 (H) 04/11/2021   HDL 76 04/11/2021   LDLCALC 149 (H) 04/11/2021   TRIG 99 04/11/2021   Lab Results  Component Value Date   VD25OH 16.5 (L) 04/11/2021   Lab Results  Component Value Date   WBC 5.0 04/11/2021   HGB 13.4 04/11/2021   HCT 42.6 04/11/2021   MCV 94 04/11/2021   PLT 364 04/11/2021    Attestation Statements:   Reviewed by clinician on day of visit: allergies, medications, problem list, medical history, surgical history, family history, social history, and previous encounter notes.  06/11/2021, CMA, am acting as transcriptionist for Edmund Hilda, DO.  I have reviewed the above documentation for accuracy and completeness, and I agree with the above. [employer], D.O.  The 21st Century Cures Act was signed into law  in 2016 which includes the topic of electronic health records.  This provides immediate access to information in MyChart.  This includes consultation notes, operative notes, office notes, lab results and pathology reports.  If you have any questions about what you read please let 2017 know at your next visit so we can discuss your concerns and take corrective action if need be.  We are right here with you.

## 2021-06-03 ENCOUNTER — Other Ambulatory Visit: Payer: Self-pay | Admitting: Medical-Surgical

## 2021-06-03 DIAGNOSIS — Z1231 Encounter for screening mammogram for malignant neoplasm of breast: Secondary | ICD-10-CM

## 2021-06-06 ENCOUNTER — Encounter (INDEPENDENT_AMBULATORY_CARE_PROVIDER_SITE_OTHER): Payer: Self-pay

## 2021-06-08 ENCOUNTER — Encounter (INDEPENDENT_AMBULATORY_CARE_PROVIDER_SITE_OTHER): Payer: Self-pay | Admitting: Family Medicine

## 2021-06-08 ENCOUNTER — Other Ambulatory Visit: Payer: Self-pay

## 2021-06-08 ENCOUNTER — Ambulatory Visit (INDEPENDENT_AMBULATORY_CARE_PROVIDER_SITE_OTHER): Payer: No Typology Code available for payment source | Admitting: Family Medicine

## 2021-06-08 VITALS — BP 127/85 | HR 73 | Temp 98.4°F | Ht 63.0 in | Wt 240.0 lb

## 2021-06-08 DIAGNOSIS — R7303 Prediabetes: Secondary | ICD-10-CM

## 2021-06-08 DIAGNOSIS — E559 Vitamin D deficiency, unspecified: Secondary | ICD-10-CM

## 2021-06-08 DIAGNOSIS — Z9189 Other specified personal risk factors, not elsewhere classified: Secondary | ICD-10-CM

## 2021-06-08 DIAGNOSIS — Z6841 Body Mass Index (BMI) 40.0 and over, adult: Secondary | ICD-10-CM

## 2021-06-08 MED ORDER — VITAMIN D (ERGOCALCIFEROL) 1.25 MG (50000 UNIT) PO CAPS: 50000.0000 [IU] | ORAL_CAPSULE | ORAL | 0 refills | Status: DC

## 2021-06-08 NOTE — Progress Notes (Signed)
Chief Complaint:   OBESITY Amy Diaz is here to discuss her progress with her obesity treatment plan along with follow-up of her obesity related diagnoses. Amy Diaz is on the Category 1 Plan with breakfast options and states she is following her eating plan approximately 80% of the time. Kenzee states she is not currently exercising.  Today's visit was #: 4 Starting weight: 245 lbs Starting date: 04/08/2021 Today's weight: 240 lbs Today's date: 06/08/2021 Total lbs lost to date: 5 Total lbs lost since last in-office visit: 2  Interim History: Amy Diaz is disappointed that she only lost 2 lbs. She is still skipping foods on plan. For example, for dinner, pt ate 2 chicken wings (small size) and 2 cups of beans.  Subjective:   1. Pre-diabetes Amy Diaz's PCP started her on Wegovy. Pt failed Saxenda and Ozempic in recent past. She is having a hard time getting the medication from pharmacy.   2. Vitamin D deficiency She is currently taking prescription vitamin D 50,000 IU each week. She denies nausea, vomiting or muscle weakness.  3. At risk for malnutrition Amy Diaz is at increased risk for malnutrition due to not following the meal plan.  Assessment/Plan:  No orders of the defined types were placed in this encounter.   Medications Discontinued During This Encounter  Medication Reason   Vitamin D, Ergocalciferol, (DRISDOL) 1.25 MG (50000 UNIT) CAPS capsule Reorder     Meds ordered this encounter  Medications   Vitamin D, Ergocalciferol, (DRISDOL) 1.25 MG (50000 UNIT) CAPS capsule    Sig: Take 1 capsule by mouth every 7 (seven) days.    Dispense:  4 capsule    Refill:  0    30 d supply;  ** OV for RF **   Do not send RF request     1. Pre-diabetes Amy Diaz will continue to work on prudent nutritional plan, weight loss, exercise (okay to start walking), and decreasing simple carbohydrates to help decrease the risk of diabetes. Medication management per PCP. Pt was educated again on nutritional  components of foods and how this affects her hunger.   2. Vitamin D deficiency Low Vitamin D level contributes to fatigue and are associated with obesity, breast, and colon cancer. She agrees to continue to take prescription Vitamin D 50,000 IU every week and will follow-up for routine testing of Vitamin D, at least 2-3 times per year to avoid over-replacement.  Refill- Vitamin D, Ergocalciferol, (DRISDOL) 1.25 MG (50000 UNIT) CAPS capsule; Take 1 capsule by mouth every 7 (seven) days.  Dispense: 4 capsule; Refill: 0  3. At risk for malnutrition Amy Diaz was given approximately 9 minutes of counseling today regarding prevention of malnutrition and ways to meet macronutrient goals.  4. Obesity with current BMI of 42.5  Amy Diaz is currently in the action stage of change. As such, her goal is to continue with weight loss efforts. She has agreed to the Category 1 Plan with breakfast options.   Exercise goals:  Start walking on treadmill 20 minutes 3 days a week.   Behavioral modification strategies: increasing lean protein intake, decreasing simple carbohydrates, and no skipping meals.  Denna has agreed to follow-up with our clinic in 3 weeks. She was informed of the importance of frequent follow-up visits to maximize her success with intensive lifestyle modifications for her multiple health conditions.   Objective:   Blood pressure 127/85, pulse 73, temperature 98.4 F (36.9 C), height 5\' 3"  (1.6 m), weight 240 lb (108.9 kg), SpO2 100 %. Body mass  index is 42.51 kg/m.  General: Cooperative, alert, well developed, in no acute distress. HEENT: Conjunctivae and lids unremarkable. Cardiovascular: Regular rhythm.  Lungs: Normal work of breathing. Neurologic: No focal deficits.   Lab Results  Component Value Date   CREATININE 0.89 04/11/2021   BUN 12 04/11/2021   NA 141 04/11/2021   K 4.1 04/11/2021   CL 100 04/11/2021   CO2 27 04/11/2021   Lab Results  Component Value Date   ALT 18  04/11/2021   AST 17 04/11/2021   ALKPHOS 72 04/11/2021   BILITOT 0.3 04/11/2021   Lab Results  Component Value Date   HGBA1C 5.8 (H) 04/11/2021   HGBA1C 5.7 (H) 06/09/2020   HGBA1C 5.8 (H) 10/29/2019   Lab Results  Component Value Date   INSULIN 19.6 04/11/2021   Lab Results  Component Value Date   TSH 1.230 04/11/2021   Lab Results  Component Value Date   CHOL 242 (H) 04/11/2021   HDL 76 04/11/2021   LDLCALC 149 (H) 04/11/2021   TRIG 99 04/11/2021   Lab Results  Component Value Date   VD25OH 16.5 (L) 04/11/2021   Lab Results  Component Value Date   WBC 5.0 04/11/2021   HGB 13.4 04/11/2021   HCT 42.6 04/11/2021   MCV 94 04/11/2021   PLT 364 04/11/2021    Attestation Statements:   Reviewed by clinician on day of visit: allergies, medications, problem list, medical history, surgical history, family history, social history, and previous encounter notes.  Edmund Hilda, CMA, am acting as transcriptionist for [employer], DO.  I have reviewed the above documentation for accuracy and completeness, and I agree with the above. Carlye Grippe, D.O.  The 21st Century Cures Act was signed into law in 2016 which includes the topic of electronic health records.  This provides immediate access to information in MyChart.  This includes consultation notes, operative notes, office notes, lab results and pathology reports.  If you have any questions about what you read please let us know at your next visit so we can discuss your concerns and take corrective action if need be.  We are right here with you.

## 2021-06-12 ENCOUNTER — Other Ambulatory Visit: Payer: Self-pay | Admitting: Medical-Surgical

## 2021-06-13 ENCOUNTER — Other Ambulatory Visit (HOSPITAL_COMMUNITY): Payer: Self-pay

## 2021-06-13 MED ORDER — CETIRIZINE HCL 10 MG PO TABS
ORAL_TABLET | Freq: Every day | ORAL | 0 refills | Status: DC
  Filled 2021-06-13 – 2021-06-20 (×2): qty 30, 30d supply, fill #0

## 2021-06-16 ENCOUNTER — Other Ambulatory Visit (INDEPENDENT_AMBULATORY_CARE_PROVIDER_SITE_OTHER): Payer: Self-pay | Admitting: Family Medicine

## 2021-06-16 DIAGNOSIS — E559 Vitamin D deficiency, unspecified: Secondary | ICD-10-CM

## 2021-06-20 ENCOUNTER — Other Ambulatory Visit (HOSPITAL_COMMUNITY): Payer: Self-pay

## 2021-06-20 NOTE — Telephone Encounter (Signed)
Dr.Opalski ?

## 2021-06-27 ENCOUNTER — Ambulatory Visit (INDEPENDENT_AMBULATORY_CARE_PROVIDER_SITE_OTHER): Payer: No Typology Code available for payment source | Admitting: Family Medicine

## 2021-07-11 ENCOUNTER — Other Ambulatory Visit (HOSPITAL_COMMUNITY): Payer: Self-pay

## 2021-07-11 ENCOUNTER — Other Ambulatory Visit: Payer: Self-pay

## 2021-07-11 ENCOUNTER — Other Ambulatory Visit (INDEPENDENT_AMBULATORY_CARE_PROVIDER_SITE_OTHER): Payer: Self-pay | Admitting: Family Medicine

## 2021-07-11 ENCOUNTER — Encounter (INDEPENDENT_AMBULATORY_CARE_PROVIDER_SITE_OTHER): Payer: Self-pay | Admitting: Family Medicine

## 2021-07-11 ENCOUNTER — Ambulatory Visit (INDEPENDENT_AMBULATORY_CARE_PROVIDER_SITE_OTHER): Payer: No Typology Code available for payment source | Admitting: Family Medicine

## 2021-07-11 VITALS — BP 110/75 | HR 74 | Temp 98.5°F | Ht 63.0 in | Wt 240.0 lb

## 2021-07-11 DIAGNOSIS — Z6841 Body Mass Index (BMI) 40.0 and over, adult: Secondary | ICD-10-CM

## 2021-07-11 DIAGNOSIS — E559 Vitamin D deficiency, unspecified: Secondary | ICD-10-CM | POA: Diagnosis not present

## 2021-07-11 DIAGNOSIS — Z9189 Other specified personal risk factors, not elsewhere classified: Secondary | ICD-10-CM | POA: Diagnosis not present

## 2021-07-11 DIAGNOSIS — R7303 Prediabetes: Secondary | ICD-10-CM

## 2021-07-11 MED ORDER — VITAMIN D (ERGOCALCIFEROL) 1.25 MG (50000 UNIT) PO CAPS
50000.0000 [IU] | ORAL_CAPSULE | ORAL | 0 refills | Status: DC
  Filled 2021-07-11: qty 4, 28d supply, fill #0

## 2021-07-11 MED ORDER — TIRZEPATIDE 5 MG/0.5ML ~~LOC~~ SOAJ
5.0000 mg | SUBCUTANEOUS | 0 refills | Status: DC
  Filled 2021-07-11 (×2): qty 2, 28d supply, fill #0

## 2021-07-12 ENCOUNTER — Other Ambulatory Visit (HOSPITAL_COMMUNITY): Payer: Self-pay

## 2021-07-12 NOTE — Progress Notes (Signed)
Chief Complaint:   OBESITY Amy Diaz is here to discuss her progress with her obesity treatment plan along with follow-up of her obesity related diagnoses. Amy Diaz is on the Category 1 Plan wit breakfast options and states she is following her eating plan approximately 50% of the time. Amy Diaz states she is not currently exercising.  Today's visit was #: 5 Starting weight: 245 lbs Starting date: 04/08/2021 Today's weight: 240 lbs Today's date: 07/11/2021 Total lbs lost to date: 5 Total lbs lost since last in-office visit: 0  Interim History: Amy Diaz ate off the plan over Thanksgiving, and she notes it was difficult for her to get back on the plan. She is glad that she didn't gain weight.  Subjective:   1. Pre-diabetes Amy Diaz has been on the same dose of Wegovy since June or July. She is not sure if it is working well.  2. Vitamin D deficiency Amy Diaz is currently taking prescription vitamin D 50,000 IU each week. She denies nausea, vomiting or muscle weakness.  3. At risk for side effect of medication Amy Diaz is at risk for drug side effects due to start a new medication.  Assessment/Plan:  No orders of the defined types were placed in this encounter.   Medications Discontinued During This Encounter  Medication Reason   Semaglutide-Weight Management (WEGOVY) 1.7 MG/0.75ML SOAJ    Vitamin D, Ergocalciferol, (DRISDOL) 1.25 MG (50000 UNIT) CAPS capsule Reorder     Meds ordered this encounter  Medications   Vitamin D, Ergocalciferol, (DRISDOL) 1.25 MG (50000 UNIT) CAPS capsule    Sig: Take 1 capsule by mouth every 7 days.    Dispense:  4 capsule    Refill:  0    30 d supply;  ** OV for RF **   Do not send RF request   tirzepatide (MOUNJARO) 5 MG/0.5ML Pen    Sig: Inject 5 mg into the skin once a week.    Dispense:  2 mL    Refill:  0     1. Pre-diabetes Amy Diaz agreed to discontinue [facility] after an in depth discussion, and she agreed to start Mounjaro 5 mg weekly with no refills.  She will continue to work on weight loss, exercise, and decreasing simple carbohydrates to help decrease the risk of diabetes.   - tirzepatide Amy Diaz) 5 MG/0.5ML Pen; Inject 5 mg into the skin once a week.  Dispense: 2 mL; Refill: 0  2. Vitamin D deficiency Amy Diaz will continue prescription Vitamin D 50,000 IU every week and we will refill for 1 month. She will follow-up for routine testing of Vitamin D, at least 2-3 times per year to avoid over-replacement.  - Vitamin D, Ergocalciferol, (DRISDOL) 1.25 MG (50000 UNIT) CAPS capsule; Take 1 capsule by mouth every 7 days.  Dispense: 4 capsule; Refill: 0  3. At risk for side effect of medication Amy Diaz was given approximately 9 minutes of drug side effect counseling today.  We discussed side effect possibility and risk versus benefits. Amy Diaz agreed to the medication and will contact this office if these side effects are intolerable.  Repetitive spaced learning was employed today to elicit superior memory formation and behavioral change.  4. Obesity with current BMI of 42.7 [name] is currently in the action stage of change. As such, her goal is to continue with weight loss efforts. She has agreed to the Category 1 Plan with breakfast options.   Exercise goals: All adults should avoid inactivity. Some physical activity is better than none, and adults  who participate in any amount of physical activity gain some health benefits.  Behavioral modification strategies: increasing lean protein intake, decreasing simple carbohydrates, and planning for success.  Amy Diaz has agreed to follow-up with our clinic in 3 weeks. She was informed of the importance of frequent follow-up visits to maximize her success with intensive lifestyle modifications for her multiple health conditions.   Objective:   Blood pressure 110/75, pulse 74, temperature 98.5 F (36.9 C), height 5\' 3"  (1.6 m), weight 240 lb (108.9 kg), SpO2 99 %. Body mass index is 42.51  kg/m.  General: Cooperative, alert, well developed, in no acute distress. HEENT: Conjunctivae and lids unremarkable. Cardiovascular: Regular rhythm.  Lungs: Normal work of breathing. Neurologic: No focal deficits.   Lab Results  Component Value Date   CREATININE 0.89 04/11/2021   BUN 12 04/11/2021   NA 141 04/11/2021   K 4.1 04/11/2021   CL 100 04/11/2021   CO2 27 04/11/2021   Lab Results  Component Value Date   ALT 18 04/11/2021   AST 17 04/11/2021   ALKPHOS 72 04/11/2021   BILITOT 0.3 04/11/2021   Lab Results  Component Value Date   HGBA1C 5.8 (H) 04/11/2021   HGBA1C 5.7 (H) 06/09/2020   HGBA1C 5.8 (H) 10/29/2019   Lab Results  Component Value Date   INSULIN 19.6 04/11/2021   Lab Results  Component Value Date   TSH 1.230 04/11/2021   Lab Results  Component Value Date   CHOL 242 (H) 04/11/2021   HDL 76 04/11/2021   LDLCALC 149 (H) 04/11/2021   TRIG 99 04/11/2021   Lab Results  Component Value Date   VD25OH 16.5 (L) 04/11/2021   Lab Results  Component Value Date   WBC 5.0 04/11/2021   HGB 13.4 04/11/2021   HCT 42.6 04/11/2021   MCV 94 04/11/2021   PLT 364 04/11/2021   No results found for: IRON, TIBC, FERRITIN  Attestation Statements:   Reviewed by clinician on day of visit: allergies, medications, problem list, medical history, surgical history, family history, social history, and previous encounter notes.   06/11/2021, am acting as transcriptionist for Trude Mcburney, DO.  I have reviewed the above documentation for accuracy and completeness, and I agree with the above. [employer], D.O.  The 21st Century Cures Act was signed into law in 2016 which includes the topic of electronic health records.  This provides immediate access to information in MyChart.  This includes consultation notes, operative notes, office notes, lab results and pathology reports.  If you have any questions about what you read please let 2017 know at your next  visit so we can discuss your concerns and take corrective action if need be.  We are right here with you.

## 2021-07-13 ENCOUNTER — Ambulatory Visit
Admission: RE | Admit: 2021-07-13 | Discharge: 2021-07-13 | Disposition: A | Discharge status: Home / Self Care | Payer: No Typology Code available for payment source | Source: Ambulatory Visit | Attending: Medical-Surgical | Admitting: Medical-Surgical

## 2021-07-13 DIAGNOSIS — Z1231 Encounter for screening mammogram for malignant neoplasm of breast: Secondary | ICD-10-CM

## 2021-08-03 ENCOUNTER — Other Ambulatory Visit (HOSPITAL_COMMUNITY): Payer: Self-pay

## 2021-08-03 ENCOUNTER — Encounter (INDEPENDENT_AMBULATORY_CARE_PROVIDER_SITE_OTHER): Payer: Self-pay | Admitting: Physician Assistant

## 2021-08-03 ENCOUNTER — Other Ambulatory Visit: Payer: Self-pay

## 2021-08-03 ENCOUNTER — Ambulatory Visit (INDEPENDENT_AMBULATORY_CARE_PROVIDER_SITE_OTHER): Payer: No Typology Code available for payment source | Admitting: Physician Assistant

## 2021-08-03 ENCOUNTER — Other Ambulatory Visit (INDEPENDENT_AMBULATORY_CARE_PROVIDER_SITE_OTHER): Payer: Self-pay | Admitting: Physician Assistant

## 2021-08-03 VITALS — BP 154/92 | HR 80 | Temp 98.1°F | Ht 63.0 in | Wt 241.0 lb

## 2021-08-03 DIAGNOSIS — Z9189 Other specified personal risk factors, not elsewhere classified: Secondary | ICD-10-CM | POA: Diagnosis not present

## 2021-08-03 DIAGNOSIS — Z6841 Body Mass Index (BMI) 40.0 and over, adult: Secondary | ICD-10-CM | POA: Diagnosis not present

## 2021-08-03 DIAGNOSIS — E559 Vitamin D deficiency, unspecified: Secondary | ICD-10-CM | POA: Diagnosis not present

## 2021-08-03 DIAGNOSIS — R7303 Prediabetes: Secondary | ICD-10-CM | POA: Diagnosis not present

## 2021-08-03 DIAGNOSIS — E66813 Obesity, class 3: Secondary | ICD-10-CM

## 2021-08-03 MED ORDER — VITAMIN D (ERGOCALCIFEROL) 1.25 MG (50000 UNIT) PO CAPS
50000.0000 [IU] | ORAL_CAPSULE | ORAL | 0 refills | Status: DC
  Filled 2021-08-03: qty 4, 28d supply, fill #0

## 2021-08-03 MED ORDER — TIRZEPATIDE 10 MG/0.5ML ~~LOC~~ SOAJ
10.0000 mg | SUBCUTANEOUS | 0 refills | Status: DC
  Filled 2021-08-03: qty 6, 84d supply, fill #0

## 2021-08-04 ENCOUNTER — Other Ambulatory Visit: Payer: Self-pay | Admitting: Medical-Surgical

## 2021-08-04 ENCOUNTER — Other Ambulatory Visit: Payer: Self-pay

## 2021-08-04 ENCOUNTER — Other Ambulatory Visit (HOSPITAL_COMMUNITY): Payer: Self-pay

## 2021-08-04 MED ORDER — CETIRIZINE HCL 10 MG PO TABS
ORAL_TABLET | Freq: Every day | ORAL | 0 refills | Status: DC
  Filled 2021-08-04: qty 30, 30d supply, fill #0

## 2021-08-04 MED ORDER — TIRZEPATIDE 10 MG/0.5ML ~~LOC~~ SOAJ
10.0000 mg | SUBCUTANEOUS | 0 refills | Status: DC
  Filled 2021-08-04 – 2021-08-10 (×2): qty 2, 28d supply, fill #0
  Filled 2021-09-02: qty 2, 28d supply, fill #1

## 2021-08-04 MED FILL — Omeprazole Cap Delayed Release 40 MG: ORAL | 90 days supply | Qty: 90 | Fill #1 | Status: AC

## 2021-08-04 NOTE — Telephone Encounter (Signed)
Do you happen to know what strengths are not on back order?

## 2021-08-04 NOTE — Telephone Encounter (Signed)
Call to patient, she is going to check to see which surrounding pharmacies may have the Mounjaro 10 mg dose.

## 2021-08-04 NOTE — Telephone Encounter (Signed)
Hey did you see if she was good with [name] sending this to [place]?

## 2021-08-04 NOTE — Progress Notes (Signed)
Chief Complaint:   OBESITY Amy Diaz is here to discuss her progress with her obesity treatment plan along with follow-up of her obesity related diagnoses. Amy Diaz is on the Category 1 Plan with breakfast options and states she is following her eating plan approximately 60% of the time. Amy Diaz states she is not currently exercising.  Today's visit was #: 6 Starting weight: 245 lbs Starting date: 04/08/2021 Today's weight: 241 lbs Today's date: 08/03/2021 Total lbs lost to date: 4 Total lbs lost since last in-office visit: 0  Interim History: Lively reports being bored at times with category 1. She has a difficult time eating all of the protein, especially at dinner. Additional lunch options and protein equivalents given today.  Subjective:   1. Pre-diabetes Pt is feeling excessively hungry intermittently throughout the day.  2. Vitamin D deficiency She is on Vit D weekly and tolerating it well.  3. At risk for diabetes mellitus Amy Diaz is at higher than average risk for developing diabetes due to obesity.   Assessment/Plan:   1. Pre-diabetes Amy Diaz will increase Mounjaro to 10 mg and continue to work on weight loss, exercise, and decreasing simple carbohydrates to help decrease the risk of diabetes.   Increase and Refill- tirzepatide (MOUNJARO) 10 MG/0.5ML Pen; Inject 10 mg into the skin once a week.  Dispense: 6 mL; Refill: 0  2. Vitamin D deficiency Low Vitamin D level contributes to fatigue and are associated with obesity, breast, and colon cancer. She agrees to continue to take prescription Vitamin D 50,000 IU every week and will follow-up for routine testing of Vitamin D, at least 2-3 times per year to avoid over-replacement.  Refill- Vitamin D, Ergocalciferol, (DRISDOL) 1.25 MG (50000 UNIT) CAPS capsule; Take 1 capsule by mouth every 7 days.  Dispense: 4 capsule; Refill: 0  3. At risk for diabetes mellitus Amy Diaz was given approximately 15 minutes of diabetes education and  counseling today. We discussed intensive lifestyle modifications today with an emphasis on weight loss as well as increasing exercise and decreasing simple carbohydrates in her diet. We also reviewed medication options with an emphasis on risk versus benefit of those discussed.   Repetitive spaced learning was employed today to elicit superior memory formation and behavioral change.  4. Obesity with current BMI of 42.7  Amy Diaz is currently in the action stage of change. As such, her goal is to continue with weight loss efforts. She has agreed to the Category 1 Plan.   Exercise goals: All adults should avoid inactivity. Some physical activity is better than none, and adults who participate in any amount of physical activity gain some health benefits.  Behavioral modification strategies: increasing lean protein intake and meal planning and cooking strategies.  Amy Diaz has agreed to follow-up with our clinic in 2 weeks. She was informed of the importance of frequent follow-up visits to maximize her success with intensive lifestyle modifications for her multiple health conditions.   Objective:   Blood pressure (!) 154/92, pulse 80, temperature 98.1 F (36.7 C), height 5\' 3"  (1.6 m), weight 241 lb (109.3 kg), SpO2 99 %. Body mass index is 42.69 kg/m.  General: Cooperative, alert, well developed, in no acute distress. HEENT: Conjunctivae and lids unremarkable. Cardiovascular: Regular rhythm.  Lungs: Normal work of breathing. Neurologic: No focal deficits.   Lab Results  Component Value Date   CREATININE 0.89 04/11/2021   BUN 12 04/11/2021   NA 141 04/11/2021   K 4.1 04/11/2021   CL 100 04/11/2021  CO2 27 04/11/2021   Lab Results  Component Value Date   ALT 18 04/11/2021   AST 17 04/11/2021   ALKPHOS 72 04/11/2021   BILITOT 0.3 04/11/2021   Lab Results  Component Value Date   HGBA1C 5.8 (H) 04/11/2021   HGBA1C 5.7 (H) 06/09/2020   HGBA1C 5.8 (H) 10/29/2019   Lab Results   Component Value Date   INSULIN 19.6 04/11/2021   Lab Results  Component Value Date   TSH 1.230 04/11/2021   Lab Results  Component Value Date   CHOL 242 (H) 04/11/2021   HDL 76 04/11/2021   LDLCALC 149 (H) 04/11/2021   TRIG 99 04/11/2021   Lab Results  Component Value Date   VD25OH 16.5 (L) 04/11/2021   Lab Results  Component Value Date   WBC 5.0 04/11/2021   HGB 13.4 04/11/2021   HCT 42.6 04/11/2021   MCV 94 04/11/2021   PLT 364 04/11/2021    Attestation Statements:   Reviewed by clinician on day of visit: allergies, medications, problem list, medical history, surgical history, family history, social history, and previous encounter notes.  Amy Diaz, CMA, am acting as transcriptionist for [employer], PA-C.  I have reviewed the above documentation for accuracy and completeness, and I agree with the above. Amy Cliche, PA-C

## 2021-08-05 ENCOUNTER — Other Ambulatory Visit: Payer: Self-pay

## 2021-08-08 ENCOUNTER — Other Ambulatory Visit: Payer: Self-pay

## 2021-08-08 ENCOUNTER — Ambulatory Visit: Payer: No Typology Code available for payment source | Admitting: Sports Medicine

## 2021-08-09 ENCOUNTER — Other Ambulatory Visit: Payer: Self-pay

## 2021-08-10 ENCOUNTER — Other Ambulatory Visit: Payer: Self-pay

## 2021-08-10 ENCOUNTER — Other Ambulatory Visit (HOSPITAL_COMMUNITY): Payer: Self-pay

## 2021-08-22 ENCOUNTER — Ambulatory Visit (INDEPENDENT_AMBULATORY_CARE_PROVIDER_SITE_OTHER): Payer: No Typology Code available for payment source | Admitting: Family Medicine

## 2021-09-02 ENCOUNTER — Other Ambulatory Visit: Payer: Self-pay | Admitting: Medical-Surgical

## 2021-09-03 ENCOUNTER — Other Ambulatory Visit (HOSPITAL_COMMUNITY): Payer: Self-pay

## 2021-09-05 ENCOUNTER — Other Ambulatory Visit (HOSPITAL_COMMUNITY): Payer: Self-pay

## 2021-09-05 MED ORDER — CETIRIZINE HCL 10 MG PO TABS
ORAL_TABLET | Freq: Every day | ORAL | 0 refills | Status: DC
  Filled 2021-09-05: qty 30, 30d supply, fill #0

## 2021-09-06 ENCOUNTER — Other Ambulatory Visit (HOSPITAL_COMMUNITY): Payer: Self-pay

## 2021-09-12 ENCOUNTER — Ambulatory Visit (INDEPENDENT_AMBULATORY_CARE_PROVIDER_SITE_OTHER): Payer: No Typology Code available for payment source | Admitting: Bariatrics

## 2021-09-12 ENCOUNTER — Encounter (INDEPENDENT_AMBULATORY_CARE_PROVIDER_SITE_OTHER): Payer: Self-pay | Admitting: Bariatrics

## 2021-09-12 ENCOUNTER — Other Ambulatory Visit: Payer: Self-pay

## 2021-09-12 ENCOUNTER — Other Ambulatory Visit (HOSPITAL_COMMUNITY): Payer: Self-pay

## 2021-09-12 VITALS — BP 137/83 | HR 64 | Temp 97.8°F | Ht 63.0 in | Wt 232.0 lb

## 2021-09-12 DIAGNOSIS — R7303 Prediabetes: Secondary | ICD-10-CM

## 2021-09-12 DIAGNOSIS — Z6841 Body Mass Index (BMI) 40.0 and over, adult: Secondary | ICD-10-CM

## 2021-09-12 DIAGNOSIS — E669 Obesity, unspecified: Secondary | ICD-10-CM | POA: Diagnosis not present

## 2021-09-12 DIAGNOSIS — E559 Vitamin D deficiency, unspecified: Secondary | ICD-10-CM | POA: Diagnosis not present

## 2021-09-12 DIAGNOSIS — E66813 Obesity, class 3: Secondary | ICD-10-CM

## 2021-09-12 MED ORDER — WEGOVY 1 MG/0.5ML ~~LOC~~ SOAJ
1.0000 mg | SUBCUTANEOUS | 0 refills | Status: DC
  Filled 2021-09-12: qty 2, 28d supply, fill #0

## 2021-09-12 MED ORDER — VITAMIN D (ERGOCALCIFEROL) 1.25 MG (50000 UNIT) PO CAPS
50000.0000 [IU] | ORAL_CAPSULE | ORAL | 0 refills | Status: DC
  Filled 2021-09-12: qty 4, 28d supply, fill #0

## 2021-09-12 NOTE — Progress Notes (Signed)
Chief Complaint:   OBESITY Amy Diaz is here to discuss her progress with her obesity treatment plan along with follow-up of her obesity related diagnoses. Amy Diaz is on the Category 1 Plan and states she is following her eating plan approximately 85% of the time. Amy Diaz states she is rowing for 15-20 minutes 3 times per week.  Today's visit was #: 7 Starting weight: 245 lbs Starting date: 04/08/2021 Today's weight: 232 lbs Today's date: 09/12/2021 Total lbs lost to date: 13 lbs Total lbs lost since last in-office visit: 9 lbs  Interim History: Amy Diaz is down 9 lbs since her last visit.  Subjective:   1. Pre-diabetes Amy Diaz was taking Mounjaro but unable to get [facility].   2. Vitamin D deficiency Amy Diaz is taking Vitamin D as directed.   Assessment/Plan:   1. Pre-diabetes Amy Diaz agrees to start Wegovy 1 mg for 1 month with no refills. She will continue to work on weight loss, exercise, and decreasing simple carbohydrates to help decrease the risk of diabetes.   - Semaglutide-Weight Management (WEGOVY) 1 MG/0.5ML SOAJ; Inject 1 mg into the skin once a week.  Dispense: 2 mL; Refill: 0  2. Vitamin D deficiency Low Vitamin D level contributes to fatigue and are associated with obesity, breast, and colon cancer. We will refill  prescription Vitamin D 50,000 IU every week for 1 month with no refills and Amy Diaz will follow-up for routine testing of Vitamin D, at least 2-3 times per year to avoid over-replacement.  - Vitamin D, Ergocalciferol, (DRISDOL) 1.25 MG (50000 UNIT) CAPS capsule; Take 1 capsule by mouth every 7 days.  Dispense: 4 capsule; Refill: 0  3. Obesity with current BMI of 41.1 Amy Diaz is currently in the action stage of change. As such, her goal is to continue with weight loss efforts. She has agreed to the Category 1 Plan.   Amy Diaz will continue meal planning and she will continue intentional eating. She will continue to adhere closely to the plan.   Exercise goals:  As  is.  Behavioral modification strategies: increasing lean protein intake, decreasing simple carbohydrates, increasing vegetables, increasing water intake, decreasing eating out, no skipping meals, meal planning and cooking strategies, keeping healthy foods in the home, and planning for success.  Amy Diaz has agreed to follow-up with our clinic in 3-4 weeks with Dr. Raliegh Scarlet. She was informed of the importance of frequent follow-up visits to maximize her success with intensive lifestyle modifications for her multiple health conditions.   Objective:   Blood pressure 137/83, pulse 64, temperature 97.8 F (36.6 C), height 5\' 3"  (1.6 m), weight 232 lb (105.2 kg), SpO2 98 %. Body mass index is 41.1 kg/m.  General: Cooperative, alert, well developed, in no acute distress. HEENT: Conjunctivae and lids unremarkable. Cardiovascular: Regular rhythm.  Lungs: Normal work of breathing. Neurologic: No focal deficits.   Lab Results  Component Value Date   CREATININE 0.89 04/11/2021   BUN 12 04/11/2021   NA 141 04/11/2021   K 4.1 04/11/2021   CL 100 04/11/2021   CO2 27 04/11/2021   Lab Results  Component Value Date   ALT 18 04/11/2021   AST 17 04/11/2021   ALKPHOS 72 04/11/2021   BILITOT 0.3 04/11/2021   Lab Results  Component Value Date   HGBA1C 5.8 (H) 04/11/2021   HGBA1C 5.7 (H) 06/09/2020   HGBA1C 5.8 (H) 10/29/2019   Lab Results  Component Value Date   INSULIN 19.6 04/11/2021   Lab Results  Component Value Date  TSH 1.230 04/11/2021   Lab Results  Component Value Date   CHOL 242 (H) 04/11/2021   HDL 76 04/11/2021   LDLCALC 149 (H) 04/11/2021   TRIG 99 04/11/2021   Lab Results  Component Value Date   VD25OH 16.5 (L) 04/11/2021   Lab Results  Component Value Date   WBC 5.0 04/11/2021   HGB 13.4 04/11/2021   HCT 42.6 04/11/2021   MCV 94 04/11/2021   PLT 364 04/11/2021   No results found for: IRON, TIBC, FERRITIN  Attestation Statements:   Reviewed by clinician on  day of visit: allergies, medications, problem list, medical history, surgical history, family history, social history, and previous encounter notes.  I, Amy Diaz, Amy Diaz, am acting as Location manager for [employer], DO.  I have reviewed the above documentation for accuracy and completeness, and I agree with the above. Jearld Lesch, DO

## 2021-09-13 ENCOUNTER — Encounter (INDEPENDENT_AMBULATORY_CARE_PROVIDER_SITE_OTHER): Payer: Self-pay | Admitting: Bariatrics

## 2021-09-29 ENCOUNTER — Other Ambulatory Visit (HOSPITAL_COMMUNITY): Payer: Self-pay

## 2021-09-29 ENCOUNTER — Telehealth: Payer: Self-pay | Admitting: Medical-Surgical

## 2021-09-29 ENCOUNTER — Encounter (INDEPENDENT_AMBULATORY_CARE_PROVIDER_SITE_OTHER): Payer: Self-pay | Admitting: Bariatrics

## 2021-10-03 ENCOUNTER — Other Ambulatory Visit (INDEPENDENT_AMBULATORY_CARE_PROVIDER_SITE_OTHER): Payer: Self-pay | Admitting: Bariatrics

## 2021-10-03 MED ORDER — WEGOVY 1.7 MG/0.75ML ~~LOC~~ SOAJ: 1.7000 mg | SUBCUTANEOUS | 0 refills | Status: DC

## 2021-10-03 NOTE — Telephone Encounter (Signed)
Dr.Brown 

## 2021-10-03 NOTE — Telephone Encounter (Signed)
Please review

## 2021-10-05 ENCOUNTER — Other Ambulatory Visit (HOSPITAL_COMMUNITY): Payer: Self-pay

## 2021-10-06 ENCOUNTER — Ambulatory Visit (INDEPENDENT_AMBULATORY_CARE_PROVIDER_SITE_OTHER): Payer: No Typology Code available for payment source | Admitting: Family Medicine

## 2021-10-10 ENCOUNTER — Other Ambulatory Visit (HOSPITAL_COMMUNITY): Payer: Self-pay

## 2021-10-10 ENCOUNTER — Encounter (INDEPENDENT_AMBULATORY_CARE_PROVIDER_SITE_OTHER): Payer: Self-pay

## 2021-10-10 ENCOUNTER — Other Ambulatory Visit (INDEPENDENT_AMBULATORY_CARE_PROVIDER_SITE_OTHER): Payer: Self-pay | Admitting: Bariatrics

## 2021-10-10 DIAGNOSIS — E559 Vitamin D deficiency, unspecified: Secondary | ICD-10-CM

## 2021-10-10 DIAGNOSIS — R7303 Prediabetes: Secondary | ICD-10-CM

## 2021-10-10 NOTE — Telephone Encounter (Signed)
Dr.Brown 

## 2021-10-12 ENCOUNTER — Other Ambulatory Visit (HOSPITAL_COMMUNITY): Payer: Self-pay

## 2021-10-18 ENCOUNTER — Other Ambulatory Visit (HOSPITAL_COMMUNITY): Payer: Self-pay

## 2021-10-18 ENCOUNTER — Other Ambulatory Visit: Payer: Self-pay | Admitting: Medical-Surgical

## 2021-10-18 ENCOUNTER — Encounter (INDEPENDENT_AMBULATORY_CARE_PROVIDER_SITE_OTHER): Payer: Self-pay | Admitting: Family Medicine

## 2021-10-18 ENCOUNTER — Telehealth (INDEPENDENT_AMBULATORY_CARE_PROVIDER_SITE_OTHER): Payer: No Typology Code available for payment source | Admitting: Family Medicine

## 2021-10-18 DIAGNOSIS — Z9189 Other specified personal risk factors, not elsewhere classified: Secondary | ICD-10-CM

## 2021-10-18 DIAGNOSIS — R7303 Prediabetes: Secondary | ICD-10-CM

## 2021-10-18 DIAGNOSIS — Z6841 Body Mass Index (BMI) 40.0 and over, adult: Secondary | ICD-10-CM

## 2021-10-18 DIAGNOSIS — I10 Essential (primary) hypertension: Secondary | ICD-10-CM

## 2021-10-18 DIAGNOSIS — E669 Obesity, unspecified: Secondary | ICD-10-CM

## 2021-10-18 DIAGNOSIS — E559 Vitamin D deficiency, unspecified: Secondary | ICD-10-CM

## 2021-10-18 MED ORDER — WEGOVY 1.7 MG/0.75ML ~~LOC~~ SOAJ
1.7000 mg | SUBCUTANEOUS | 0 refills | Status: DC
  Filled 2021-10-18: qty 3, 28d supply, fill #0

## 2021-10-18 MED ORDER — VITAMIN D (ERGOCALCIFEROL) 1.25 MG (50000 UNIT) PO CAPS
50000.0000 [IU] | ORAL_CAPSULE | ORAL | 0 refills | Status: DC
  Filled 2021-10-18: qty 4, 28d supply, fill #0

## 2021-10-19 ENCOUNTER — Other Ambulatory Visit (HOSPITAL_COMMUNITY): Payer: Self-pay

## 2021-10-19 MED ORDER — METOPROLOL SUCCINATE ER 25 MG PO TB24
25.0000 mg | ORAL_TABLET | Freq: Every day | ORAL | 0 refills | Status: DC
  Filled 2021-10-19: qty 30, 30d supply, fill #0

## 2021-10-19 MED ORDER — CETIRIZINE HCL 10 MG PO TABS
10.0000 mg | ORAL_TABLET | Freq: Every day | ORAL | 0 refills | Status: DC
  Filled 2021-10-19: qty 30, 30d supply, fill #0

## 2021-10-25 NOTE — Progress Notes (Signed)
? ? ?TeleHealth Visit:  ?Due to the COVID-19 pandemic, this visit was completed with telemedicine (audio/video) technology to reduce patient and provider exposure as well as to preserve personal protective equipment.  ? ?Amy Diaz has verbally consented to this TeleHealth visit. The patient is located at home, the provider is located at the [facility] office. The participants in this visit include the listed provider and patient. The visit was conducted today via MyChart video. ? ?Chief Complaint: OBESITY ?Amy Diaz is here to discuss her progress with her obesity treatment plan along with follow-up of her obesity related diagnoses. Amy Diaz is on the Category 1 Plan and states she is following her eating plan approximately 30-40% of the time. Amy Diaz states she is using the stationary bike for 30 minutes 3-4 times per week. ? ?Today's visit was #: 8 ?Starting weight: 245 lbs ?Starting date: 04/08/2021 ? ?Interim History: Amy Diaz had to do a video visit today because her car died.  She lost her grandmother last week and had her birthday on [birth year].  She has gained weight, but now she is ready to commit herself to the process. ? ?Subjective:  ? ?1. Prediabetes ?It has been 2-3 months since she has been on [facility]  She tells me for her insurance, she must be givne the 1.7 mg dose or else insurance will not cover it. ? ?2. Vitamin D deficiency ?Amy Diaz is tolerating medication(s) well without side effects.  Medication compliance is good and patient appears to be taking it as prescribed.  Denies additional concerns regarding this condition.  ? ?3. At risk for side effect of medication ?Amy Diaz is at risk for medication side effect due to restarting Wegovy. ? ?Assessment/Plan:  ? ?Medications Discontinued During This Encounter  ?Medication Reason  ? Semaglutide-Weight Management (WEGOVY) 1.7 MG/0.75ML SOAJ Entry Error  ? Vitamin D, Ergocalciferol, (DRISDOL) 1.25 MG (50000 UNIT) CAPS capsule Reorder  ?   ? ?Meds ordered this encounter  ?Medications  ? Vitamin D, Ergocalciferol, (DRISDOL) 1.25 MG (50000 UNIT) CAPS capsule  ?  Sig: Take 1 capsule by mouth every 7 days.  ?  Dispense:  4 capsule  ?  Refill:  0  ? Semaglutide-Weight Management (WEGOVY) 1.7 MG/0.75ML SOAJ  ?  Sig: Inject 1.7 mg into the skin once a week.  ?  Dispense:  3 mL  ?  Refill:  0  ?  ? ?1. Prediabetes ?Restart Wegovy at 1.7 mg weekly.  Risks and benefits discussed with patient, especially since she has not been taking it for a long time.   She realizes her increased risk for side effects.  She understands and desires medicine today. ?Continue prudent nutritional plan very closely to prevent increased side effects. ? ?- Refill Semaglutide-Weight Management (WEGOVY) 1.7 MG/0.75ML SOAJ; Inject 1.7 mg into the skin once a week.  Dispense: 3 mL; Refill: 0 ? ? ?2. Vitamin D deficiency ?- I again reiterated the importance of vitamin D (as well as calcium) to their health and wellbeing.  ?- I reviewed possible symptoms of low Vitamin D:  low energy, depressed mood, muscle aches, joint aches, osteoporosis etc. ?- low Vitamin D levels may be linked to an increased risk of cardiovascular events and even increased risk of cancers- such as colon and breast.  ?- ideal vitamin D levels reviewed with patient  ?- I recommend pt take a weekly prescription vit D - see script below   ?- Informed patient this may be a lifelong thing, and  she was encouraged to continue to take the medicine until told otherwise.    ?- weight loss will likely improve availability of vitamin D, thus encouraged Amy Diaz to continue with meal plan and their weight loss efforts to further improve this condition.  Thus, we will need to monitor levels regularly (every 3-4 mo on average) to keep levels within normal limits and prevent over supplementation. ?- pt's questions and concerns regarding this condition addressed. ? ?- Refill Vitamin D, Ergocalciferol, (DRISDOL) 1.25 MG (50000 UNIT) CAPS  capsule; Take 1 capsule by mouth every 7 days.  Dispense: 4 capsule; Refill: 0 ? ?3. At risk for side effect of medication ?Amy Diaz was given approximately 10 minutes of drug side effect counseling today.  We discussed side effect possibility and risk versus benefits. Amy Diaz agreed to the medication and will contact this office if these side effects are intolerable. ?Repetitive spaced learning was employed today to elicit superior memory formation and behavioral change.. ? ? ?4. Obesity with current BMI of 41.10 ? ?Amy Diaz is currently in the action stage of change. As such, her goal is to continue with weight loss efforts. She has agreed to the Category 1 Plan.  ? ?Exercise goals:  As is. ? ?Behavioral modification strategies: increasing lean protein intake, no skipping meals, and planning for success. ? ?Amy Diaz has agreed to follow-up with our clinic in 2-3 weeks. She was informed of the importance of frequent follow-up visits to maximize her success with intensive lifestyle modifications for her multiple health conditions. ? ? ?Objective:  ? ?VITALS: Per patient if applicable, see vitals. ?GENERAL: Alert and in no acute distress. ?CARDIOPULMONARY: No increased WOB. Speaking in clear sentences.  ?PSYCH: Pleasant and cooperative. Speech normal rate and rhythm. Affect is appropriate. Insight and judgement are appropriate. Attention is focused, linear, and appropriate.  ?NEURO: Oriented as arrived to appointment on time with no prompting.  ? ?Lab Results  ?Component Value Date  ? CREATININE 0.89 04/11/2021  ? BUN 12 04/11/2021  ? NA 141 04/11/2021  ? K 4.1 04/11/2021  ? CL 100 04/11/2021  ? CO2 27 04/11/2021  ? ?Lab Results  ?Component Value Date  ? ALT 18 04/11/2021  ? AST 17 04/11/2021  ? ALKPHOS 72 04/11/2021  ? BILITOT 0.3 04/11/2021  ? ?Lab Results  ?Component Value Date  ? HGBA1C 5.8 (H) 04/11/2021  ? HGBA1C 5.7 (H) 06/09/2020  ? HGBA1C 5.8 (H) 10/29/2019  ? ?Lab Results  ?Component Value Date  ? INSULIN 19.6  04/11/2021  ? ?Lab Results  ?Component Value Date  ? TSH 1.230 04/11/2021  ? ?Lab Results  ?Component Value Date  ? CHOL 242 (H) 04/11/2021  ? HDL 76 04/11/2021  ? LDLCALC 149 (H) 04/11/2021  ? TRIG 99 04/11/2021  ? ?Lab Results  ?Component Value Date  ? VD25OH 16.5 (L) 04/11/2021  ? ?Lab Results  ?Component Value Date  ? WBC 5.0 04/11/2021  ? HGB 13.4 04/11/2021  ? HCT 42.6 04/11/2021  ? MCV 94 04/11/2021  ? PLT 364 04/11/2021  ? ?Attestation Statements:  ? ?Reviewed by clinician on day of visit: allergies, medications, problem list, medical history, surgical history, family history, social history, and previous encounter notes. ? ?Time spent on visit including pre-visit chart review and post-visit charting and care was 30 minutes.  ? ?I, Insurance claims handler, CMA, am acting as Energy manager for [employer], DO. ?

## 2021-10-31 ENCOUNTER — Ambulatory Visit (INDEPENDENT_AMBULATORY_CARE_PROVIDER_SITE_OTHER): Payer: No Typology Code available for payment source

## 2021-10-31 ENCOUNTER — Ambulatory Visit (INDEPENDENT_AMBULATORY_CARE_PROVIDER_SITE_OTHER): Payer: No Typology Code available for payment source | Admitting: Medical-Surgical

## 2021-10-31 ENCOUNTER — Encounter: Payer: Self-pay | Admitting: Medical-Surgical

## 2021-10-31 VITALS — BP 114/75 | HR 72 | Resp 20 | Ht 63.0 in | Wt 240.7 lb

## 2021-10-31 DIAGNOSIS — Z23 Encounter for immunization: Secondary | ICD-10-CM | POA: Diagnosis not present

## 2021-10-31 DIAGNOSIS — M25562 Pain in left knee: Secondary | ICD-10-CM | POA: Diagnosis not present

## 2021-10-31 DIAGNOSIS — G8929 Other chronic pain: Secondary | ICD-10-CM

## 2021-10-31 DIAGNOSIS — Z1211 Encounter for screening for malignant neoplasm of colon: Secondary | ICD-10-CM

## 2021-10-31 DIAGNOSIS — M5441 Lumbago with sciatica, right side: Secondary | ICD-10-CM

## 2021-10-31 DIAGNOSIS — M25551 Pain in right hip: Secondary | ICD-10-CM | POA: Diagnosis not present

## 2021-10-31 DIAGNOSIS — M545 Low back pain, unspecified: Secondary | ICD-10-CM | POA: Diagnosis not present

## 2021-10-31 MED ORDER — PREDNISONE 50 MG PO TABS: 50.0000 mg | ORAL_TABLET | Freq: Every day | ORAL | 0 refills | Status: DC

## 2021-10-31 MED ORDER — MELOXICAM 15 MG PO TABS: 15.0000 mg | ORAL_TABLET | Freq: Every day | ORAL | 0 refills | Status: DC

## 2021-10-31 NOTE — Progress Notes (Signed)
?  HPI with pertinent ROS:  ? ?CC: left knee pain ? ?HPI: ?Pleasant 51 year old female presenting today for evaluation of left knee pain. Has been having trouble for a while with her left knee but it has gradually worsened until she is having trouble going down stairs and has to turn sideways to get down them safely. Her discomfort is in the anterior lower knee and extends up into the left thigh. Bending down makes it worse. Is also having pain in the right low back and right hip. Has done PT for the back and hip but getting scheduling to coincide with her work schedule was tough.  ? ?Due for colorectal cancer screening. Considering Cologuard but does have a family history of colon polyps.  ? ?Due for shingles vaccine.  ? ?I reviewed the past medical history, family history, social history, surgical history, and allergies today and no changes were needed.  Please see the problem list section below in epic for further details. ? ? ?Physical exam:  ? ?General: Well Developed, well nourished, and in no acute distress.  ?Neuro: Alert and oriented x3.  ?HEENT: Normocephalic, atraumatic.  ?Skin: Warm and dry. ?Cardiac: Regular rate and rhythm, no murmurs rubs or gallops, no lower extremity edema.  ?Respiratory: Clear to auscultation bilaterally. Not using accessory muscles, speaking in full sentences. ?Left knee: Full ROM and strength. Tenderness to palpation around the lower part of the anterior knee joint. Crepitus noted with passive ROM. Tenderness with McMurray test but unclear if this is related to significant soreness of the knee or potential meniscal injury.  ? ?Impression and Recommendations:   ? ?1. Acute pain of left knee ?X-rays of the left knee today. Prednisone 50mg  daily x 5 days then switch to Meloxicam 15mg  daily. Referring to PT. Knee brace provided to give extra support when ambulating. ?- DG Knee Complete 4 Views Left; Future ?- meloxicam (MOBIC) 15 MG tablet; Take 1 tablet (15 mg total) by mouth daily.   Dispense: 30 tablet; Refill: 0 ?- Ambulatory referral to Physical Therapy ?- predniSONE (DELTASONE) 50 MG tablet; Take 1 tablet (50 mg total) by mouth daily.  Dispense: 5 tablet; Refill: 0 ? ?2. Need for shingles vaccine ?Shingrix #1 given in office today.  ?- Varicella-zoster vaccine IM (Shingrix) ? ?3. Right hip pain ?Prednisone then Meloxicam. PT referral.  ?- meloxicam (MOBIC) 15 MG tablet; Take 1 tablet (15 mg total) by mouth daily.  Dispense: 30 tablet; Refill: 0 ?- Ambulatory referral to Physical Therapy ?- predniSONE (DELTASONE) 50 MG tablet; Take 1 tablet (50 mg total) by mouth daily.  Dispense: 5 tablet; Refill: 0 ? ?4. Chronic right-sided low back pain with right-sided sciatica ?Prednisone then Meloxicam. Referral to PT. Lumbar spine x-rays today. ?- meloxicam (MOBIC) 15 MG tablet; Take 1 tablet (15 mg total) by mouth daily.  Dispense: 30 tablet; Refill: 0 ?- Ambulatory referral to Physical Therapy ?- DG Lumbar Spine Complete; Future ?- predniSONE (DELTASONE) 50 MG tablet; Take 1 tablet (50 mg total) by mouth daily.  Dispense: 5 tablet; Refill: 0 ? ?5. Colon cancer screening ?Referring to GI for colonoscopy given family history of polyps.  ?- Ambulatory referral to Gastroenterology ? ?Return in about 6 weeks (around 12/12/2021) for left knee/right hip/back pain follow up. ?___________________________________________ ?Clearnce Sorrel, DNP, APRN, FNP-BC ?Primary Care and Sports Medicine ?[place] ?

## 2021-11-02 ENCOUNTER — Encounter: Payer: Self-pay | Admitting: Gastroenterology

## 2021-11-02 ENCOUNTER — Encounter: Payer: Self-pay | Admitting: Medical-Surgical

## 2021-11-02 ENCOUNTER — Other Ambulatory Visit: Payer: Self-pay | Admitting: Medical-Surgical

## 2021-11-02 DIAGNOSIS — M25551 Pain in right hip: Secondary | ICD-10-CM

## 2021-11-02 DIAGNOSIS — G8929 Other chronic pain: Secondary | ICD-10-CM

## 2021-11-02 DIAGNOSIS — M25562 Pain in left knee: Secondary | ICD-10-CM

## 2021-11-09 ENCOUNTER — Ambulatory Visit (INDEPENDENT_AMBULATORY_CARE_PROVIDER_SITE_OTHER): Payer: No Typology Code available for payment source | Admitting: Family Medicine

## 2021-11-09 ENCOUNTER — Ambulatory Visit (AMBULATORY_SURGERY_CENTER): Payer: No Typology Code available for payment source | Admitting: *Deleted

## 2021-11-09 ENCOUNTER — Ambulatory Visit: Payer: No Typology Code available for payment source | Attending: Medical-Surgical | Admitting: Physical Therapy

## 2021-11-09 ENCOUNTER — Encounter: Payer: Self-pay | Admitting: Physical Therapy

## 2021-11-09 ENCOUNTER — Other Ambulatory Visit (HOSPITAL_COMMUNITY): Payer: Self-pay

## 2021-11-09 ENCOUNTER — Encounter (INDEPENDENT_AMBULATORY_CARE_PROVIDER_SITE_OTHER): Payer: Self-pay | Admitting: Family Medicine

## 2021-11-09 VITALS — BP 115/73 | HR 73 | Temp 98.3°F | Ht 63.0 in | Wt 237.0 lb

## 2021-11-09 VITALS — Ht 63.0 in | Wt 236.0 lb

## 2021-11-09 DIAGNOSIS — E669 Obesity, unspecified: Secondary | ICD-10-CM

## 2021-11-09 DIAGNOSIS — E7849 Other hyperlipidemia: Secondary | ICD-10-CM | POA: Diagnosis not present

## 2021-11-09 DIAGNOSIS — E559 Vitamin D deficiency, unspecified: Secondary | ICD-10-CM | POA: Diagnosis not present

## 2021-11-09 DIAGNOSIS — M25562 Pain in left knee: Secondary | ICD-10-CM | POA: Insufficient documentation

## 2021-11-09 DIAGNOSIS — G8929 Other chronic pain: Secondary | ICD-10-CM | POA: Insufficient documentation

## 2021-11-09 DIAGNOSIS — R293 Abnormal posture: Secondary | ICD-10-CM | POA: Diagnosis not present

## 2021-11-09 DIAGNOSIS — M5459 Other low back pain: Secondary | ICD-10-CM | POA: Diagnosis present

## 2021-11-09 DIAGNOSIS — M25662 Stiffness of left knee, not elsewhere classified: Secondary | ICD-10-CM | POA: Insufficient documentation

## 2021-11-09 DIAGNOSIS — I10 Essential (primary) hypertension: Secondary | ICD-10-CM

## 2021-11-09 DIAGNOSIS — Z9189 Other specified personal risk factors, not elsewhere classified: Secondary | ICD-10-CM

## 2021-11-09 DIAGNOSIS — M25551 Pain in right hip: Secondary | ICD-10-CM | POA: Diagnosis not present

## 2021-11-09 DIAGNOSIS — Z1211 Encounter for screening for malignant neoplasm of colon: Secondary | ICD-10-CM

## 2021-11-09 DIAGNOSIS — R252 Cramp and spasm: Secondary | ICD-10-CM | POA: Insufficient documentation

## 2021-11-09 DIAGNOSIS — R7303 Prediabetes: Secondary | ICD-10-CM

## 2021-11-09 DIAGNOSIS — Z6841 Body Mass Index (BMI) 40.0 and over, adult: Secondary | ICD-10-CM

## 2021-11-09 MED ORDER — NA SULFATE-K SULFATE-MG SULF 17.5-3.13-1.6 GM/177ML PO SOLN
1.0000 | Freq: Once | ORAL | 0 refills | Status: AC
  Filled 2021-11-09: qty 354, 1d supply, fill #0

## 2021-11-09 MED ORDER — VITAMIN D (ERGOCALCIFEROL) 1.25 MG (50000 UNIT) PO CAPS
50000.0000 [IU] | ORAL_CAPSULE | ORAL | 0 refills | Status: DC
  Filled 2021-11-09: qty 4, 28d supply, fill #0

## 2021-11-09 MED ORDER — WEGOVY 1.7 MG/0.75ML ~~LOC~~ SOAJ
1.7000 mg | SUBCUTANEOUS | 0 refills | Status: DC
  Filled 2021-11-09: qty 3, 28d supply, fill #0

## 2021-11-09 NOTE — Progress Notes (Addendum)
No egg or soy allergy known to patient  ?No issues known to pt with past sedation with any surgeries or procedures ?Patient denies ever being told they had issues or difficulty with intubation  ?No FH of Malignant Hyperthermia ?Pt is not on diet pills ?Pt is not on  home 02  ?Pt is not on blood thinners  ?Pt denies issues with constipation  ?No A fib or A flutter ? ? NO PA's for preps discussed with pt In PV today  ?Discussed with pt there will be an out-of-pocket cost for prep and that varies from $0 to 70 +  dollars - pt verbalized understanding  ? ?Due to the COVID-19 pandemic we are asking patients to follow certain guidelines in PV and the [facility]   ?Pt aware of COVID protocols and [facility] guidelines  ? ?PV completed over the phone. Pt verified name, DOB, address and insurance during PV today.  ? ?Pt encouraged to call with questions or issues.  ?If pt has My chart, procedure instructions sent via My Chart  ? ?

## 2021-11-09 NOTE — Therapy (Signed)
[facility] [facility] ?[address] [place] [address] ?[place], [place], [address] ?Phone: [phone]   Fax:  [phone] ? ?Physical Therapy Evaluation ? ?Patient Details  ?Name: Amy Diaz ?MRN: [id number] ?Date of Birth: 1971/06/20 ?Referring Provider (PT): Christen Butter NP ? ? ?Encounter Date: 11/09/2021 ? ? PT End of Session - 11/09/21 0931   ? ? Visit Number 1   ? Date for PT Re-Evaluation 01/04/22   ? Authorization Type [facility]   ? PT Start Time [phone]   ? PT Stop Time 1017   ? PT Time Calculation (min) 46 min   ? Activity Tolerance Patient tolerated treatment well   ? Behavior During Therapy [facility] for tasks assessed/performed   ? ?  ?  ? ?  ? ? ?Past Medical History:  ?Diagnosis Date  ? Abnormal mammogram   ? Allergy   ? on zyrtec- seasonal  ? Arthritis   ? back  ? Back pain   ? Enthesopathy   ? GERD (gastroesophageal reflux disease)   ? Glaucoma   ? no drops so far early onset per pt  ? H/O degenerative disc disease   ? Hypertension   ? Prediabetes   ? Sleep apnea   ? no OSA per pt per 2 sleep studies  ? Swelling   ? ? ?Past Surgical History:  ?Procedure Laterality Date  ? ABDOMINAL HYSTERECTOMY    ? CARPAL TUNNEL RELEASE Right   ? CESAREAN SECTION    ? x1  ? CYST REMOVAL TRUNK    ? ECTOPIC PREGNANCY SURGERY    ? WISDOM TOOTH EXTRACTION    ? ? ?There were no vitals filed for this visit. ? ? ? Subjective Assessment - 11/09/21 0934   ? ? Subjective Pt fell down her stairs around 2017 and tore her MCL in the left knee and did not have surgery to repair. In 2018, she slipped on the running board of the truck and hit her back. They told her her ligaments were pulling away from her Rt hip (enthesopathy). She had injections which helped. Presently, she cannot lie flat and can't relax and awakes her from sleep. With walking she has to walk in a flexed position and feels like she is not taking full steps. Her left knee has fluid in it . Pain is in medial knee and PT. Hard to put socks and  shoes on.   ? Pertinent History HTN, enthesopathy   ? How long can you walk comfortably? 10 min   ? Diagnostic tests xrays: multilevel DDD   ? Patient Stated Goals better posture when walking and be able to tie her shoes   ? Currently in Pain? Yes   ? Pain Score 4    ? Pain Location Back   ? Pain Orientation Right;Left   moves into sacral area  ? Pain Descriptors / Indicators Sharp;Aching   deep ache and catches  ? Pain Type Chronic pain   ? Pain Radiating Towards into right hip   ? Pain Onset More than a month ago   ? Pain Frequency Constant   ? Aggravating Factors  lying on her back, bending over, walking   ? Pain Relieving Factors pressure   ? Multiple Pain Sites Yes   ? Pain Score 3   ? Pain Location Knee   ? Pain Orientation Left   ? Pain Descriptors / Indicators Aching;Tender   ? Pain Type Chronic pain   ? Pain Onset More than a month  ago   ? Pain Frequency Constant   ? Aggravating Factors  walking, prolonged sitting   ? Pain Relieving Factors moving and straightening it   ? ?  ?  ? ?  ? ? ? ? ? [facility] PT Assessment - 11/09/21 0001   ? ?  ? Assessment  ? Medical Diagnosis Chronic Rt LBP without sciatica; acute left knee pain; Rt hip pain   ? Referring Provider (PT) Christen Butter NP   ? Onset Date/Surgical Date 10/12/21   ? Hand Dominance Left   ? Next MD Visit May   ? Prior Therapy for both back and knee 2020   ?  ? Precautions  ? Precautions None   ?  ? Restrictions  ? Weight Bearing Restrictions No   ?  ? Balance Screen  ? Has the patient fallen in the past 6 months Yes   ? How many times? 1   tripped over dog's bed and fell on left knee  ? Has the patient had a decrease in activity level because of a fear of falling?  No   ? Is the patient reluctant to leave their home because of a fear of falling?  No   ?  ? Home Environment  ? Living Environment Private residence   ? Living Arrangements Spouse/significant other   ? Additional Comments walks up stairs turning sideways; step to gait   ?  ? Prior Function  ?  Level of Independence Independent   ? Vocation Full time employment   ? 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  ? Observation/Other Assessments  ? Focus on Therapeutic Outcomes (FOTO)  49 (55 predicted) Back   ?  ? Posture/Postural Control  ? Posture Comments shifts right in sitting, stands in 10 deg flexion; presents with Rt ant inominate   ?  ? ROM / Strength  ? AROM / PROM / Strength AROM;Strength   ?  ? AROM  ? Overall AROM Comments lumbar flex stands in 10 deg;   ? AROM Assessment Site Lumbar;Knee   ? Right/Left Knee Left;Right   ? Right Knee Extension 0   ? Right Knee Flexion 115   ? Left Knee Extension 0   ? Left Knee Flexion 105   ? Lumbar Flexion 78   ? Lumbar Extension 5   ? Lumbar - Right Side Bend 20   ? Lumbar - Left Side Bend 30   ? Lumbar - Right Rotation 60%   ? Lumbar - Left Rotation 60%   ?  ? Strength  ? Overall Strength Comments Bil LE grossly 5/5 except Lt knee 4+/5   ?  ? Flexibility  ? Soft Tissue Assessment /Muscle Length yes   ? Hamstrings marked bil   ? Quadriceps marked bil   ? ITB marked bil   ? Piriformis mod bil   ? Quadratus Lumborum right tight   ?  ? Palpation  ? Spinal mobility deferred   ? Palpation comment markded tenderness and many TPs in bil gluteals; marked tenderness of bil lumbar and QLs, bil quads, ITBs and hip ADDuctors   ?  ? Special Tests  ? Other special tests +ober bil   ?  ? Ambulation/Gait  ? Ambulation/Gait Yes   ? Ambulation/Gait Assistance 7: Independent   ? Ambulation Distance (Feet) 60 Feet   ? Assistive device None   ? Gait Pattern Within Functional Limits   ? Ambulation Surface Level   ? Gait velocity  fast pace   ? Gait Comments stairs not assessed but pt reports step to gait and needing to turn laterally   ? ?  ?  ? ?  ? ? ? ? ? ? ? ? ? ? ? ? ? ?Objective measurements completed on examination: See above findings.  ? ? ? ? ? ? ? ? ? ? ? ? ? ? PT Education - 11/09/21 2145   ? ? Education Details HEP; POC   ? Person(s) Educated Patient   ?  Methods Explanation;Demonstration;Handout   ? Comprehension Verbalized understanding;Returned demonstration   ? ?  ?  ? ?  ? ? ? PT Short Term Goals - 11/09/21 2154   ? ?  ? PT SHORT TERM GOAL #1  ? Title Ind with intial HEP   ? Time 2   ? Period Weeks   ? Status New   ? Target Date 11/23/21   ?  ? PT SHORT TERM GOAL #2  ? Title Pt to demonstrate normal pelvic alignment and be able to stand with a neutral spine without difficulty.   ? Time 4   ? Period Weeks   ? Status New   ? Target Date 12/07/21   ? ?  ?  ? ?  ? ? ? ? PT Long Term Goals - 11/09/21 2155   ? ?  ? PT LONG TERM GOAL #1  ? Title ind with HEP and its progression   ? Time 8   ? Period Weeks   ? Status New   ? Target Date 01/04/22   ?  ? PT LONG TERM GOAL #2  ? Title Pt to demonstrate improved flexibility as evidenced by functional lumbar ROM in all planes.   ? Time 8   ? Period Weeks   ? Status New   ?  ? PT LONG TERM GOAL #3  ? Title Improved left knee ROM to 110 deg or better to help normalize stair climbing.   ? Time 6   ? Period Weeks   ? Status New   ?  ? PT LONG TERM GOAL #4  ? Title Pt able to climb stairs with a reciprocal gait pattern and no rotation.   ? Time 8   ? Period Weeks   ? Status New   ?  ? PT LONG TERM GOAL #5  ? Title Pt to report decreased back and knee pain with ambulation and ADLs by >= 80%.   ? Time 8   ? Period Weeks   ? Status New   ?  ? Additional Long Term Goals  ? Additional Long Term Goals Yes   ?  ? PT LONG TERM GOAL #6  ? Title Improved FOTO to 55 from 49 showing functional improvement in her back   ? Time 8   ? Period Weeks   ? Status New   ? ?  ?  ? ?  ? ? ? ? ? ? ? ? ? Plan - 11/09/21 1025   ? ? Clinical Impression Statement Patient presents with complaints of chronic low back and left knee pain that has worsened in the past month. She injured both her back and knee 3-5 years ago in separate falls. She tore her left MCL but did not have it repaired. Her back is the most painful and affects her ability to stand and  walk. When it's really hurting it moves into her post right hip. It also wakes her at night  from sleep. Her knee is constant and also affects gait and she is unable to climb stairs reciprocally. [place]

## 2021-11-09 NOTE — Patient Instructions (Signed)
Access Code: [email or web address] ?URL: [email or web address] ?Date: 11/09/2021 ?Prepared by: Raynelle Fanning ? ?Exercises ?- Supine Hamstring Stretch with Strap  - 2 x daily - 7 x weekly - 1 sets - 3 reps - 30-60 sec hold ?- Supine ITB Stretch with Strap  - 2 x daily - 7 x weekly - 1 sets - 3 reps - 30-60 sec hold ?- Supine Lower Trunk Rotation  - 2 x daily - 7 x weekly - 1 sets - 5 reps - 10 sec hold ?- Supine Piriformis Stretch with Leg Straight  - 2 x daily - 7 x weekly - 1 sets - 3 reps - 30 sec hold ?

## 2021-11-10 LAB — VITAMIN D 25 HYDROXY (VIT D DEFICIENCY, FRACTURES): Vit D, 25-Hydroxy: 46.2 ng/mL (ref 30.0–100.0)

## 2021-11-10 LAB — COMPREHENSIVE METABOLIC PANEL
ALT: 27 IU/L (ref 0–32)
AST: 18 IU/L (ref 0–40)
Albumin/Globulin Ratio: 1.6 (ref 1.2–2.2)
Albumin: 4.1 g/dL (ref 3.8–4.9)
Alkaline Phosphatase: 67 IU/L (ref 44–121)
BUN/Creatinine Ratio: 14 (ref 9–23)
BUN: 15 mg/dL (ref 6–24)
Bilirubin Total: 0.3 mg/dL (ref 0.0–1.2)
CO2: 29 mmol/L (ref 20–29)
Calcium: 9.9 mg/dL (ref 8.7–10.2)
Chloride: 100 mmol/L (ref 96–106)
Creatinine, Ser: 1.04 mg/dL — ABNORMAL HIGH (ref 0.57–1.00)
Globulin, Total: 2.6 g/dL (ref 1.5–4.5)
Glucose: 80 mg/dL (ref 70–99)
Potassium: 4.2 mmol/L (ref 3.5–5.2)
Sodium: 139 mmol/L (ref 134–144)
Total Protein: 6.7 g/dL (ref 6.0–8.5)
eGFR: 65 mL/min/{1.73_m2} (ref >59)

## 2021-11-10 LAB — LIPID PANEL WITH LDL/HDL RATIO
Cholesterol, Total: 233 mg/dL — ABNORMAL HIGH (ref 100–199)
HDL: 81 mg/dL (ref >39)
LDL Chol Calc (NIH): 135 mg/dL — ABNORMAL HIGH (ref 0–99)
LDL/HDL Ratio: 1.7 ratio (ref 0.0–3.2)
Triglycerides: 96 mg/dL (ref 0–149)
VLDL Cholesterol Cal: 17 mg/dL (ref 5–40)

## 2021-11-10 LAB — HEMOGLOBIN A1C
Est. average glucose Bld gHb Est-mCnc: 123 mg/dL
Hgb A1c MFr Bld: 5.9 % — ABNORMAL HIGH (ref 4.8–5.6)

## 2021-11-10 LAB — INSULIN, RANDOM: INSULIN: 22.5 u[IU]/mL (ref 2.6–24.9)

## 2021-11-11 ENCOUNTER — Ambulatory Visit: Payer: No Typology Code available for payment source | Admitting: Physical Therapy

## 2021-11-11 DIAGNOSIS — M5459 Other low back pain: Secondary | ICD-10-CM

## 2021-11-11 DIAGNOSIS — G8929 Other chronic pain: Secondary | ICD-10-CM

## 2021-11-11 DIAGNOSIS — M25662 Stiffness of left knee, not elsewhere classified: Secondary | ICD-10-CM

## 2021-11-11 DIAGNOSIS — R293 Abnormal posture: Secondary | ICD-10-CM

## 2021-11-11 DIAGNOSIS — R252 Cramp and spasm: Secondary | ICD-10-CM

## 2021-11-11 NOTE — Progress Notes (Signed)
? ? ? ?Chief Complaint:  ? ?OBESITY ?Amy Diaz is here to discuss her progress with her obesity treatment plan along with follow-up of her obesity related diagnoses. [name] is on the Category 1 Plan and states she is following her eating plan approximately 30-40% of the time. [name] states she is on the treadmill for 30 minutes 2 times per week. ? ?Today's visit was #: 9 ?Starting weight: 245 lbs ?Starting date: 04/08/2021 ?Today's weight: 237 lbs ?Today's date: 11/09/2021 ?Total lbs lost to date: 8 ?Total lbs lost since last in-office visit: 0 ? ?Interim History: [name] feels the meal plan gets boring after a while. She recognizes that she eats when she is stressed and has had quite a few stressors lately. She is looking for alternative breakfast options. It is difficult getting 3 meals in. She is also limited on prep time due to long hours at work.  ? ?Subjective:  ? ?1. Pre-diabetes ?Amy Diaz's last A1c was 5.8 and insulin 19.6. She is on a GLP-1. ? ?2. Vitamin D deficiency ?Amy Diaz is on Vitamin D prescription. Her last Vitamin D level was 16.5, and she notes fatigue.  ? ?3. Other hyperlipidemia ?Amy Diaz's last LDL was 149, HDL 76, and triglycerides 99. She is not on medications currently.  ? ?4. Essential hypertension ?Amy Diaz's blood pressure is well controlled today. She denies chest pain, chest pressure, or headache. She is on Hyzaar and Toprol XL.  ? ?5. At risk for deficient intake of food ?The patient is at a higher than average risk of deficient intake of food due to dietary. ? ?Assessment/Plan:  ? ?1. Pre-diabetes ?We will check labs today, and we will follow up at [name]'s next appointment.  ? ?- Hemoglobin A1c ?- Insulin, random ? ?2. Vitamin D deficiency ?We will check labs today, and we will refill prescription Vitamin D for 1 month. [name] will follow-up for routine testing of Vitamin D, at least 2-3 times per year to avoid over-replacement. ? ?- VITAMIN D 25 Hydroxy (Vit-D Deficiency, Fractures) ?- Vitamin D,  Ergocalciferol, (DRISDOL) 1.25 MG (50000 UNIT) CAPS capsule; Take 1 capsule by mouth every 7 days.  Dispense: 4 capsule; Refill: 0 ? ?3. Other hyperlipidemia ?We will check labs today, and we will follow up at [facility]'s next appointment.  ? ?- Lipid Panel With LDL/HDL Ratio ? ?4. Essential hypertension ?We will check labs today, and we will follow up at [facility]'s next appointment.  ? ?- Comprehensive metabolic panel ? ?5. At risk for deficient intake of food ?Amy Diaz was given approximately 15 minutes of deficient intake of food prevention counseling today. Amy Diaz is at risk for eating too few calories based on current food recall. She was encouraged to focus on meeting caloric and protein goals according to her recommended meal plan. ? ?6. Obesity with current BMI of 42.1 ?Demeshia is currently in the action stage of change. As such, her goal is to continue with weight loss efforts. She has agreed to the Category 1 Plan.  ? ?We discussed various medication options to help Amy Diaz with her weight loss efforts and we both agreed to continue Wegovy 1.7 mg SubQ weekly, and we will refill for 1 month. ? ?- Semaglutide-Weight Management (WEGOVY) 1.7 MG/0.75ML SOAJ; Inject 1.7 mg into the skin once a week.  Dispense: 3 mL; Refill: 0 ? ?Exercise goals: All adults should avoid inactivity. Some physical activity is better than none, and adults who participate in any amount of physical activity gain some health benefits. ? ?Behavioral modification strategies:  increasing lean protein intake, meal planning and cooking strategies, keeping healthy foods in the home, and planning for success. ? ?Amy Diaz has agreed to follow-up with our clinic in 3 to 4 weeks. She was informed of the importance of frequent follow-up visits to maximize her success with intensive lifestyle modifications for her multiple health conditions.  ? ?Objective:  ? ?Blood pressure 115/73, pulse 73, temperature 98.3 ?F (36.8 ?C), height 5\' 3"  (1.6 m), weight 237 lb (107.5  kg), SpO2 100 %. ?Body mass index is 41.98 kg/m?. ? ?General: Cooperative, alert, well developed, in no acute distress. ?HEENT: Conjunctivae and lids unremarkable. ?Cardiovascular: Regular rhythm.  ?Lungs: Normal work of breathing. ?Neurologic: No focal deficits.  ? ?Lab Results  ?Component Value Date  ? CREATININE 1.04 (H) 11/09/2021  ? BUN 15 11/09/2021  ? NA 139 11/09/2021  ? K 4.2 11/09/2021  ? CL 100 11/09/2021  ? CO2 29 11/09/2021  ? ?Lab Results  ?Component Value Date  ? ALT 27 11/09/2021  ? AST 18 11/09/2021  ? ALKPHOS 67 11/09/2021  ? BILITOT 0.3 11/09/2021  ? ?Lab Results  ?Component Value Date  ? HGBA1C 5.9 (H) 11/09/2021  ? HGBA1C 5.8 (H) 04/11/2021  ? HGBA1C 5.7 (H) 06/09/2020  ? HGBA1C 5.8 (H) 10/29/2019  ? ?Lab Results  ?Component Value Date  ? INSULIN 22.5 11/09/2021  ? INSULIN 19.6 04/11/2021  ? ?Lab Results  ?Component Value Date  ? TSH 1.230 04/11/2021  ? ?Lab Results  ?Component Value Date  ? CHOL 233 (H) 11/09/2021  ? HDL 81 11/09/2021  ? LDLCALC 135 (H) 11/09/2021  ? TRIG 96 11/09/2021  ? ?Lab Results  ?Component Value Date  ? VD25OH 46.2 11/09/2021  ? VD25OH 16.5 (L) 04/11/2021  ? ?Lab Results  ?Component Value Date  ? WBC 5.0 04/11/2021  ? HGB 13.4 04/11/2021  ? HCT 42.6 04/11/2021  ? MCV 94 04/11/2021  ? PLT 364 04/11/2021  ? ?No results found for: IRON, TIBC, FERRITIN ? ?Attestation Statements:  ? ?Reviewed by clinician on day of visit: allergies, medications, problem list, medical history, surgical history, family history, social history, and previous encounter notes. ? ? ?I, 06/11/2021, am acting as transcriptionist for Burt Knack, MD. ? ?I have reviewed the above documentation for accuracy and completeness, and I agree with the above. Reuben Likes, MD ? ? ?

## 2021-11-11 NOTE — Therapy (Signed)
[place] ?[facility] ?[address] ?[place], [place], [address] ?Phone: [phone]   Fax:  [phone] ? ?Physical Therapy Treatment ? ?Patient Details  ?Name: Amy Diaz ?MRN: [id number] ?Date of Birth: 1971-06-05 ?Referring Provider (PT): Samuel Bouche NP ? ? ?Encounter Date: 11/11/2021 ? ? PT End of Session - 11/11/21 0848   ? ? Visit Number 2   ? Date for PT Re-Evaluation 01/04/22   ? Authorization Type [facility] Focus   ? PT Start Time 0848   ? PT Stop Time 0930   ? PT Time Calculation (min) 42 min   ? Activity Tolerance Patient tolerated treatment well   ? Behavior During Therapy [facility] for tasks assessed/performed   ? ?  ?  ? ?  ? ? ?Past Medical History:  ?Diagnosis Date  ? Abnormal mammogram   ? Allergy   ? on zyrtec- seasonal  ? Arthritis   ? back  ? Back pain   ? Enthesopathy   ? GERD (gastroesophageal reflux disease)   ? Glaucoma   ? no drops so far early onset per pt  ? H/O degenerative disc disease   ? Hypertension   ? Prediabetes   ? Sleep apnea   ? no OSA per pt per 2 sleep studies  ? Swelling   ? ? ?Past Surgical History:  ?Procedure Laterality Date  ? ABDOMINAL HYSTERECTOMY    ? CARPAL TUNNEL RELEASE Right   ? CESAREAN SECTION    ? x1  ? CYST REMOVAL TRUNK    ? ECTOPIC PREGNANCY SURGERY    ? WISDOM TOOTH EXTRACTION    ? ? ?There were no vitals filed for this visit. ? ? Subjective Assessment - 11/11/21 0852   ? ? Subjective Pt states she didn't do any exercises yesterday. Pt was able to try the stretches once.   ? Pertinent History HTN, enthesopathy   ? How long can you walk comfortably? 10 min   ? Diagnostic tests xrays: multilevel DDD   ? Patient Stated Goals better posture when walking and be able to tie her shoes   ? Currently in Pain? Yes   ? Pain Score 5    ? Pain Location Hip   ? Pain Orientation Right   ? Pain Onset More than a month ago   ? Pain Onset More than a month ago   ? ?  ?  ? ?  ? ? ? ? ? [facility] PT Assessment - 11/11/21 0001   ? ?  ? Assessment  ?  Medical Diagnosis Chronic Rt LBP without sciatica; acute left knee pain; Rt hip pain   ? Referring Provider (PT) Samuel Bouche NP   ? Onset Date/Surgical Date 10/12/21   ? Hand Dominance Left   ? Next MD Visit May   ? ?  ?  ? ?  ? ? ? ? ? ? ? ? ? ? ? ? ? ? ? ? [facility] Adult PT Treatment/Exercise - 11/11/21 0001   ? ?  ? Knee/Hip Exercises: Stretches  ? Passive Hamstring Stretch Right;Left;30 seconds   ? ITB Stretch Right;Left;30 seconds   ? Piriformis Stretch Right;Left;30 seconds   ? Other Knee/Hip Stretches prone midback stretch x10   ? Other Knee/Hip Stretches low trunk rotation 5x10 sec   ?  ? Knee/Hip Exercises: Aerobic  ? Nustep L4 x 5 min UEs/LEs   ?  ? Manual Therapy  ? Manual Therapy Soft tissue mobilization   ? Manual therapy comments  skilled assessment and palpation for TPDN   ? Soft tissue mobilization TPR and STM glute and lumbar paraspinals   ? ?  ?  ? ?  ? ? ? Trigger Point Dry Needling - 11/11/21 0001   ? ? Consent Given? Yes   ? Education Handout Provided Yes   ? Muscles Treated Back/Hip Gluteus medius;Gluteus maximus;Lumbar multifidi   ? Gluteus Medius Response Twitch response elicited;Palpable increased muscle length   ? Gluteus Maximus Response Twitch response elicited;Palpable increased muscle length   ? Lumbar multifidi Response Twitch response elicited;Palpable increased muscle length   ? ?  ?  ? ?  ? ? ? ? ? ? ? ? ? ? PT Short Term Goals - 11/09/21 2154   ? ?  ? PT SHORT TERM GOAL #1  ? Title Ind with intial HEP   ? Time 2   ? Period Weeks   ? Status New   ? Target Date 11/23/21   ?  ? PT SHORT TERM GOAL #2  ? Title Pt to demonstrate normal pelvic alignment and be able to stand with a neutral spine without difficulty.   ? Time 4   ? Period Weeks   ? Status New   ? Target Date 12/07/21   ? ?  ?  ? ?  ? ? ? ? PT Long Term Goals - 11/09/21 2155   ? ?  ? PT LONG TERM GOAL #1  ? Title ind with HEP and its progression   ? Time 8   ? Period Weeks   ? Status New   ? Target Date 01/04/22   ?  ? PT LONG TERM  GOAL #2  ? Title Pt to demonstrate improved flexibility as evidenced by functional lumbar ROM in all planes.   ? Time 8   ? Period Weeks   ? Status New   ?  ? PT LONG TERM GOAL #3  ? Title Improved left knee ROM to 110 deg or better to help normalize stair climbing.   ? Time 6   ? Period Weeks   ? Status New   ?  ? PT LONG TERM GOAL #4  ? Title Pt able to climb stairs with a reciprocal gait pattern and no rotation.   ? Time 8   ? Period Weeks   ? Status New   ?  ? PT LONG TERM GOAL #5  ? Title Pt to report decreased back and knee pain with ambulation and ADLs by >= 80%.   ? Time 8   ? Period Weeks   ? Status New   ?  ? Additional Long Term Goals  ? Additional Long Term Goals Yes   ?  ? PT LONG TERM GOAL #6  ? Title Improved FOTO to 55 from 49 showing functional improvement in her back   ? Time 8   ? Period Weeks   ? Status New   ? ?  ?  ? ?  ? ? ? ? ? ? ? ? Plan - 11/11/21 0856   ? ? Clinical Impression Statement Session worked primarily on improving general mobility and flexibility. Trialed TPDN this session with good pt response.   ? Personal Factors and Comorbidities Comorbidity 3+   ? Comorbidities HTN, obese, DDD , left knee pain, enthesopathy   ? Examination-Activity Limitations Bend;Lift;Stand;Stairs;Squat;Sleep;Sit;Locomotion Level   ? Stability/Clinical Decision Making Stable/Uncomplicated   ? Rehab Potential Excellent   ? PT Frequency 2x / week   ?  PT Duration 8 weeks   ? PT Treatment/Interventions ADLs/Self Care Home Management;Aquatic Therapy;Cryotherapy;Electrical Stimulation;Iontophoresis 4mg /ml Dexamethasone;Moist Heat;Traction;Neuromuscular re-education;Therapeutic exercise;Therapeutic activities;Stair training;Patient/family education;Manual techniques;Dry needling;Taping;Spinal Manipulations;Joint Manipulations   ? PT Next Visit Plan Work on back,hip and leg flexibility; get her spine moving; monitor right inominate for ant rotation; progress HEP with stretches for HF/quads/lumbar/QL; DN/MT to bil  gluteals/lumbar/ADDuctors/quads. Consider trialing tape for L medial knee.   ? PT Home Exercise Plan [email or web address]   ? Consulted and Agree with Plan of Care Patient   ? ?  ?  ? ?  ? ? ?Patient will benefit from skilled therapeutic intervention in order to improve the following deficits and impairments:  Decreased range of motion, Increased muscle spasms, Decreased activity tolerance, Pain, Impaired flexibility, Postural dysfunction, Decreased strength ? ?Visit Diagnosis: ?Other low back pain ? ?Chronic pain of left knee ? ?Abnormal posture ? ?Stiffness of left knee, not elsewhere classified ? ?Cramp and spasm ? ? ? ? ?Problem List ?Patient Active Problem List  ? Diagnosis Date Noted  ? Prediabetes 11/03/2019  ? Weight gain 11/03/2019  ? Insomnia 11/03/2019  ? Fall 11/03/2019  ? Essential hypertension 11/03/2019  ? ? ?Vannessa Godown April Gordy Levan, PT, DPT ?11/11/2021, 9:31 AM ? ?[place] ?[facility] ?[address] ?[place], [place], [address] ?Phone: [phone]   Fax:  [phone] ? ?Name: Amy Diaz ?MRN: [id number] ?Date of Birth: 11-11-1970 ? ? ? ?

## 2021-11-17 ENCOUNTER — Encounter: Payer: Self-pay | Admitting: Physical Therapy

## 2021-11-17 ENCOUNTER — Ambulatory Visit: Payer: No Typology Code available for payment source | Admitting: Physical Therapy

## 2021-11-17 DIAGNOSIS — R252 Cramp and spasm: Secondary | ICD-10-CM

## 2021-11-17 DIAGNOSIS — M5459 Other low back pain: Secondary | ICD-10-CM

## 2021-11-17 DIAGNOSIS — M25662 Stiffness of left knee, not elsewhere classified: Secondary | ICD-10-CM

## 2021-11-17 DIAGNOSIS — R293 Abnormal posture: Secondary | ICD-10-CM

## 2021-11-17 DIAGNOSIS — G8929 Other chronic pain: Secondary | ICD-10-CM

## 2021-11-17 NOTE — Therapy (Signed)
[place] ?[facility] ?[address] [place] [address] ?[place], [place], [address] ?Phone: [phone]   Fax:  [phone] ? ?Physical Therapy Treatment ? ?Patient Details  ?Name: Amy Diaz ?MRN: [id number] ?Date of Birth: [birth year] ?Referring Provider (PT): Christen Butter NP ? ? ?Encounter Date: 11/17/2021 ? ? PT End of Session - 11/17/21 1155   ? ? Visit Number 3   ? Date for PT Re-Evaluation 01/04/22   ? Authorization Type [facility] Focus   ? PT Start Time 1155   ? PT Stop Time 1231   ? PT Time Calculation (min) 36 min   ? Activity Tolerance Patient tolerated treatment well   ? Behavior During Therapy [facility] for tasks assessed/performed   ? ?  ?  ? ?  ? ? ?Past Medical History:  ?Diagnosis Date  ? Abnormal mammogram   ? Allergy   ? on zyrtec- seasonal  ? Arthritis   ? back  ? Back pain   ? Enthesopathy   ? GERD (gastroesophageal reflux disease)   ? Glaucoma   ? no drops so far early onset per pt  ? H/O degenerative disc disease   ? Hypertension   ? Prediabetes   ? Sleep apnea   ? no OSA per pt per 2 sleep studies  ? Swelling   ? ? ?Past Surgical History:  ?Procedure Laterality Date  ? ABDOMINAL HYSTERECTOMY    ? CARPAL TUNNEL RELEASE Right   ? CESAREAN SECTION    ? x1  ? CYST REMOVAL TRUNK    ? ECTOPIC PREGNANCY SURGERY    ? WISDOM TOOTH EXTRACTION    ? ? ?There were no vitals filed for this visit. ? ? Subjective Assessment - 11/17/21 1158   ? ? Subjective The DN was great. Today she feels like there is something floating in her knee. She gets intermittent sharp pains. Still feeling in low back.   ? Patient Stated Goals better posture when walking and be able to tie her shoes   ? Currently in Pain? Yes   ? Pain Score 4    ? Pain Location Back   ? Pain Orientation Lower   ? Pain Descriptors / Indicators Aching   ? ?  ?  ? ?  ? ? ? ? ? ? ? ? ? ? ? ? ? ? ? ? ? ? ? ? [facility] Adult PT Treatment/Exercise - 11/17/21 0001   ? ?  ? Knee/Hip Exercises: Stretches  ? Hip Flexor Stretch Right;Left;4 reps;30  seconds   ? Hip Flexor Stretch Limitations two ea iin sitting and supine with strap   ?  ? Knee/Hip Exercises: Aerobic  ? Nustep L4 x 6 min UEs/LEs   ?  ? Manual Therapy  ? Manual Therapy Soft tissue mobilization   ? Manual therapy comments skilled assessment and palpation for TPDN   ? Soft tissue mobilization IASTM to left quads and ITB   ? ?  ?  ? ?  ? ? ? Trigger Point Dry Needling - 11/17/21 0001   ? ? Consent Given? Yes   ? Education Handout Provided Previously provided   ? Muscles Treated Lower Quadrant Vastus lateralis;Rectus femoris;Vastus intermedius   ? Dry Needling Comments left   ? Rectus femoris Response Twitch response elicited;Palpable increased muscle length   ? Vastus lateralis Response Twitch response elicited;Palpable increased muscle length   ? Vastus intermedius Response Palpable increased muscle length   ? ?  ?  ? ?  ? ? ? ? ? ? ? ?  PT Education - 11/17/21 1248   ? ? Education Details HEP   ? Person(s) Educated Patient   ? Methods Explanation;Demonstration;Handout   ? Comprehension Verbalized understanding;Returned demonstration   ? ?  ?  ? ?  ? ? ? PT Short Term Goals - 11/17/21 1246   ? ?  ? PT SHORT TERM GOAL #1  ? Title Ind with intial HEP   ? Status Achieved   ? ?  ?  ? ?  ? ? ? ? PT Long Term Goals - 11/09/21 2155   ? ?  ? PT LONG TERM GOAL #1  ? Title ind with HEP and its progression   ? Time 8   ? Period Weeks   ? Status New   ? Target Date 01/04/22   ?  ? PT LONG TERM GOAL #2  ? Title Pt to demonstrate improved flexibility as evidenced by functional lumbar ROM in all planes.   ? Time 8   ? Period Weeks   ? Status New   ?  ? PT LONG TERM GOAL #3  ? Title Improved left knee ROM to 110 deg or better to help normalize stair climbing.   ? Time 6   ? Period Weeks   ? Status New   ?  ? PT LONG TERM GOAL #4  ? Title Pt able to climb stairs with a reciprocal gait pattern and no rotation.   ? Time 8   ? Period Weeks   ? Status New   ?  ? PT LONG TERM GOAL #5  ? Title Pt to report decreased back  and knee pain with ambulation and ADLs by >= 80%.   ? Time 8   ? Period Weeks   ? Status New   ?  ? Additional Long Term Goals  ? Additional Long Term Goals Yes   ?  ? PT LONG TERM GOAL #6  ? Title Improved FOTO to 55 from 49 showing functional improvement in her back   ? Time 8   ? Period Weeks   ? Status New   ? ?  ?  ? ?  ? ? ? ? ? ? ? ? Plan - 11/17/21 1244   ? ? Clinical Impression Statement Pt was 10 min late due to flat tire. She reported significant improvement in left hip pain since DN. She reports ongoing low back pain and pain in the left knee. Great response to DN in left quads today and IASTM. Significant improvement in tissue mobility, sensitivity and her ability to walk without pain.   ? Comorbidities HTN, obese, DDD , left knee pain, enthesopathy   ? PT Frequency 2x / week   ? PT Duration 8 weeks   ? PT Treatment/Interventions ADLs/Self Care Home Management;Aquatic Therapy;Cryotherapy;Electrical Stimulation;Iontophoresis 4mg /ml Dexamethasone;Moist Heat;Traction;Neuromuscular re-education;Therapeutic exercise;Therapeutic activities;Stair training;Patient/family education;Manual techniques;Dry needling;Taping;Spinal Manipulations;Joint Manipulations   ? PT Next Visit Plan Work on back,hip and leg flexibility; get her spine moving; monitor right inominate for ant rotation; progress HEP with stretches for HF/quads/lumbar/QL; DN/MT to bil gluteals/lumbar/ADDuctors/quads. Consider trialing tape for L medial knee.   ? PT Home Exercise Plan [email or web address]   ? Consulted and Agree with Plan of Care Patient   ? ?  ?  ? ?  ? ? ?Patient will benefit from skilled therapeutic intervention in order to improve the following deficits and impairments:  Decreased range of motion, Increased muscle spasms, Decreased activity tolerance, Pain, Impaired flexibility, Postural dysfunction, Decreased strength ? ?Visit  Diagnosis: ?Other low back pain ? ?Chronic pain of left knee ? ?Abnormal posture ? ?Stiffness of left knee, not  elsewhere classified ? ?Cramp and spasm ? ? ? ? ?Problem List ?Patient Active Problem List  ? Diagnosis Date Noted  ? Prediabetes 11/03/2019  ? Weight gain 11/03/2019  ? Insomnia 11/03/2019  ? Fall 11/03/2019  ? Essential hypertension 11/03/2019  ? ? ?Solon Palm, PT ?11/17/2021, 12:48 PM ? ?[place] ?[facility] ?[address] [place] [address] ?[place], [place], [address] ?Phone: [phone]   Fax:  [phone] ? ?Name: Kasandra Fehr ?MRN: [id number] ?Date of Birth: [birth year] ? ? ? ?

## 2021-11-17 NOTE — Patient Instructions (Signed)
Access Code: [email or web address] ?URL: [email or web address] ?Date: 11/17/2021 ?Prepared by: Raynelle Fanning ? ?Exercises ?- Supine Hamstring Stretch with Strap  - 2 x daily - 7 x weekly - 1 sets - 3 reps - 30-60 sec hold ?- Supine ITB Stretch with Strap  - 2 x daily - 7 x weekly - 1 sets - 3 reps - 30-60 sec hold ?- Supine Lower Trunk Rotation  - 2 x daily - 7 x weekly - 1 sets - 5 reps - 10 sec hold ?- Supine Piriformis Stretch with Leg Straight  - 2 x daily - 7 x weekly - 1 sets - 3 reps - 30 sec hold ?- Seated Hip Flexor Stretch  - 2 x daily - 7 x weekly - 1 sets - 3 reps - 30-60 sec hold ?- Supine Quadriceps Stretch with Strap on Table  - 2 x daily - 7 x weekly - 1 sets - 3 reps - 30-60 sec hold ?

## 2021-11-19 ENCOUNTER — Other Ambulatory Visit: Payer: Self-pay | Admitting: Medical-Surgical

## 2021-11-19 ENCOUNTER — Other Ambulatory Visit (HOSPITAL_COMMUNITY): Payer: Self-pay

## 2021-11-21 ENCOUNTER — Other Ambulatory Visit (HOSPITAL_COMMUNITY): Payer: Self-pay

## 2021-11-21 ENCOUNTER — Ambulatory Visit: Payer: No Typology Code available for payment source | Admitting: Physical Therapy

## 2021-11-21 ENCOUNTER — Encounter: Payer: Self-pay | Admitting: Physical Therapy

## 2021-11-21 ENCOUNTER — Encounter: Payer: Self-pay | Admitting: Gastroenterology

## 2021-11-21 DIAGNOSIS — M5459 Other low back pain: Secondary | ICD-10-CM | POA: Diagnosis not present

## 2021-11-21 DIAGNOSIS — M25662 Stiffness of left knee, not elsewhere classified: Secondary | ICD-10-CM

## 2021-11-21 DIAGNOSIS — R293 Abnormal posture: Secondary | ICD-10-CM

## 2021-11-21 DIAGNOSIS — R252 Cramp and spasm: Secondary | ICD-10-CM

## 2021-11-21 DIAGNOSIS — G8929 Other chronic pain: Secondary | ICD-10-CM

## 2021-11-21 MED ORDER — CETIRIZINE HCL 10 MG PO TABS
10.0000 mg | ORAL_TABLET | Freq: Every day | ORAL | 0 refills | Status: DC
  Filled 2021-11-21: qty 30, 30d supply, fill #0

## 2021-11-21 MED FILL — Losartan Potassium & Hydrochlorothiazide Tab 100-25 MG: ORAL | 90 days supply | Qty: 90 | Fill #0 | Status: AC

## 2021-11-21 NOTE — Therapy (Signed)
[place] ?[facility] ?[address] ?[place], [place], [address] ?Phone: [phone]   Fax:  [phone] ? ?Physical Therapy Treatment ? ?Patient Details  ?Name: Amy Diaz ?MRN: [id number] ?Date of Birth: 1971-07-02 ?Referring Provider (PT): Samuel Bouche NP ? ? ?Encounter Date: 11/21/2021 ? ? PT End of Session - 11/21/21 1155   ? ? Visit Number 4   ? Date for PT Re-Evaluation 01/04/22   ? Authorization Type [facility] Focus   ? PT Start Time 1150   ? PT Stop Time 1230   ? PT Time Calculation (min) 40 min   ? Activity Tolerance Patient tolerated treatment well   ? Behavior During Therapy [facility] for tasks assessed/performed   ? ?  ?  ? ?  ? ? ?Past Medical History:  ?Diagnosis Date  ? Abnormal mammogram   ? Allergy   ? on zyrtec- seasonal  ? Arthritis   ? back  ? Back pain   ? Enthesopathy   ? GERD (gastroesophageal reflux disease)   ? Glaucoma   ? no drops so far early onset per pt  ? H/O degenerative disc disease   ? Hypertension   ? Prediabetes   ? Sleep apnea   ? no OSA per pt per 2 sleep studies  ? Swelling   ? ? ?Past Surgical History:  ?Procedure Laterality Date  ? ABDOMINAL HYSTERECTOMY    ? CARPAL TUNNEL RELEASE Right   ? CESAREAN SECTION    ? x1  ? CYST REMOVAL TRUNK    ? ECTOPIC PREGNANCY SURGERY    ? WISDOM TOOTH EXTRACTION    ? ? ?There were no vitals filed for this visit. ? ? Subjective Assessment - 11/21/21 1155   ? ? Subjective Saturday morning awoke with a start and her back spasmed. Pain is on the left, worse with sitting and turning right. Pain goes down mid thigh posteriorly.   ? Patient Stated Goals better posture when walking and be able to tie her shoes   ? Currently in Pain? Yes   ? Pain Score 6    ? Pain Location Back   ? Pain Orientation Left   ? Pain Descriptors / Indicators Sharp   ? Pain Radiating Towards down left leg to post thigh   ? ?  ?  ? ?  ? ? ? ? ? ? ? ? ? ? ? ? ? ? ? ? ? ? ? ? [facility] Adult PT Treatment/Exercise - 11/21/21 0001   ? ?  ? Self-Care   ? Self-Care Posture;Other Self-Care Comments   ? Posture correct posture with lumbar support; positons to avoid   ? Other Self-Care Comments  TENs; lumbar ext protocol   ?  ? Knee/Hip Exercises: Standing  ? Other Standing Knee Exercises lumbar extension not tolerated at start of session; able to complete small range extensions after prone lying and estim.   ?  ? Knee/Hip Exercises: Prone  ? Other Prone Exercises started with prone lying and pain at 6/10 and into left leg; prone over pillow x 3 min abolishes pain; prone on elbows x 2 min increases LBP to 4/10; prone over pillow abolishes pain again.   ? ?  ?  ? ?  ? ? ? ? ? ? ? ? ? ? PT Education - 11/21/21 1218   ? ? Education Details Seated posture/lumbar support, Lumbar extension progression and TENs unit info   ? Person(s) Educated Patient   ? Methods Explanation;Demonstration;Handout   ?  Comprehension Verbalized understanding;Returned demonstration   ? ?  ?  ? ?  ? ? ? PT Short Term Goals - 11/17/21 1246   ? ?  ? PT SHORT TERM GOAL #1  ? Title Ind with intial HEP   ? Status Achieved   ? ?  ?  ? ?  ? ? ? ? PT Long Term Goals - 11/09/21 2155   ? ?  ? PT LONG TERM GOAL #1  ? Title ind with HEP and its progression   ? Time 8   ? Period Weeks   ? Status New   ? Target Date 01/04/22   ?  ? PT LONG TERM GOAL #2  ? Title Pt to demonstrate improved flexibility as evidenced by functional lumbar ROM in all planes.   ? Time 8   ? Period Weeks   ? Status New   ?  ? PT LONG TERM GOAL #3  ? Title Improved left knee ROM to 110 deg or better to help normalize stair climbing.   ? Time 6   ? Period Weeks   ? Status New   ?  ? PT LONG TERM GOAL #4  ? Title Pt able to climb stairs with a reciprocal gait pattern and no rotation.   ? Time 8   ? Period Weeks   ? Status New   ?  ? PT LONG TERM GOAL #5  ? Title Pt to report decreased back and knee pain with ambulation and ADLs by >= 80%.   ? Time 8   ? Period Weeks   ? Status New   ?  ? Additional Long Term Goals  ? Additional Long Term  Goals Yes   ?  ? PT LONG TERM GOAL #6  ? Title Improved FOTO to 55 from 49 showing functional improvement in her back   ? Time 8   ? Period Weeks   ? Status New   ? ?  ?  ? ?  ? ? ? ? ? ? ? ? Plan - 11/21/21 1218   ? ? Clinical Impression Statement Pt presents with increased pain in her back and LLE to mid posterior thigh today s/p sitting upright abruptly from sleep when waking from a dream. Her pain is abolished with prone over one pillow and is 4/10 in just the back with prone on elbows. She has marked tightness of lumbar paraspinals and gluteals. Pt was unable to tolerate any exercises other than positioning. Good response to trial of estim and heat. Patient able to move much better at end of session and was able to perform small range standing extensions without pain. Extension protocol reviewed with pt and added to HEP.   ? Comorbidities HTN, obese, DDD , left knee pain, enthesopathy   ? PT Frequency 2x / week   ? PT Duration 8 weeks   ? PT Treatment/Interventions ADLs/Self Care Home Management;Aquatic Therapy;Cryotherapy;Electrical Stimulation;Iontophoresis 4mg /ml Dexamethasone;Moist Heat;Traction;Neuromuscular re-education;Therapeutic exercise;Therapeutic activities;Stair training;Patient/family education;Manual techniques;Dry needling;Taping;Spinal Manipulations;Joint Manipulations   ? PT Next Visit Plan Assess response to extension protocol. monitor right inominate for ant rotation; progress HEP with stretches for HF/quads/lumbar/QL; DN/MT to bil gluteals/lumbar/ADDuctors/quads. Consider trialing tape for L medial knee.   ? PT Home Exercise Plan [email or web address]   ? Consulted and Agree with Plan of Care Patient   ? ?  ?  ? ?  ? ? ?Patient will benefit from skilled therapeutic intervention in order to improve the following deficits and impairments:  Decreased  range of motion, Increased muscle spasms, Decreased activity tolerance, Pain, Impaired flexibility, Postural dysfunction, Decreased strength ? ?Visit  Diagnosis: ?Other low back pain ? ?Chronic pain of left knee ? ?Abnormal posture ? ?Stiffness of left knee, not elsewhere classified ? ?Cramp and spasm ? ? ? ? ?Problem List ?Patient Active Problem List  ? Diagnosis Date Noted  ? Prediabetes 11/03/2019  ? Weight gain 11/03/2019  ? Insomnia 11/03/2019  ? Fall 11/03/2019  ? Essential hypertension 11/03/2019  ? ? ?Madelyn Flavors, PT ?11/21/2021, 1:07 PM ? ? ?[facility] ?[place] ?[place], [place], [address] ?Phone: [phone]   Fax:  [phone] ? ?Name: Amy Diaz ?MRN: [id number] ?Date of Birth: 28-Jun-1971 ? ? ? ?

## 2021-11-21 NOTE — Patient Instructions (Signed)
Access Code: [email or web address] ?URL: [email or web address] ?Date: 11/21/2021 ?Prepared by: Raynelle Fanning ? ?Exercises ?- Supine Hamstring Stretch with Strap  - 2 x daily - 7 x weekly - 1 sets - 3 reps - 30-60 sec hold ?- Supine ITB Stretch with Strap  - 2 x daily - 7 x weekly - 1 sets - 3 reps - 30-60 sec hold ?- Supine Lower Trunk Rotation  - 2 x daily - 7 x weekly - 1 sets - 5 reps - 10 sec hold ?- Supine Piriformis Stretch with Leg Straight  - 2 x daily - 7 x weekly - 1 sets - 3 reps - 30 sec hold ?- Seated Hip Flexor Stretch  - 2 x daily - 7 x weekly - 1 sets - 3 reps - 30-60 sec hold ?- Supine Quadriceps Stretch with Strap on Table  - 2 x daily - 7 x weekly - 1 sets - 3 reps - 30-60 sec hold ?- Seated Correct Posture  ?- Lying Prone with 1 Pillow  - 2 x daily -  minutes hold ?- Lying Prone  - 2 x daily - 5 minutes hold ?- Static Prone on Elbows  - 2 x daily - 5 minutes hold ?- Prone Press Up  - 4 x daily - 10-15 reps - 1 second hold ?- Standing Lumbar Extension with Counter  - 4 x daily - 1 sets - 10-15 reps - 1 second hold ?- Standing Lumbar Extension at Wall - Forearms  - 4 x daily - 1 sets - 10-15 reps - 1 second hold ? ?Patient Education ?- TENS Unit ?

## 2021-11-23 ENCOUNTER — Ambulatory Visit: Payer: No Typology Code available for payment source | Admitting: Physical Therapy

## 2021-11-23 ENCOUNTER — Other Ambulatory Visit (HOSPITAL_COMMUNITY): Payer: Self-pay

## 2021-11-23 ENCOUNTER — Encounter: Payer: Self-pay | Admitting: Physical Therapy

## 2021-11-23 DIAGNOSIS — M5459 Other low back pain: Secondary | ICD-10-CM

## 2021-11-23 DIAGNOSIS — G8929 Other chronic pain: Secondary | ICD-10-CM

## 2021-11-23 DIAGNOSIS — R293 Abnormal posture: Secondary | ICD-10-CM

## 2021-11-23 DIAGNOSIS — R252 Cramp and spasm: Secondary | ICD-10-CM

## 2021-11-23 DIAGNOSIS — M25662 Stiffness of left knee, not elsewhere classified: Secondary | ICD-10-CM

## 2021-11-23 NOTE — Therapy (Signed)
[place] [facility] ?[address] ?[place], [place], [address] ?Phone: [phone]   Fax:  [phone] ? ?Physical Therapy Treatment ? ?Patient Details  ?Name: Amy Diaz ?MRN: [id number] ?Date of Birth: 09/14/1970 ?Referring Provider (PT): Christen ButterJoy Jessup NP ? ? ?Encounter Date: 11/23/2021 ? ? PT End of Session - 11/23/21 1157   ? ? Visit Number 5   ? Date for PT Re-Evaluation 01/04/22   ? Authorization Type [facility]   ? PT Start Time 1155   ? PT Stop Time 1255   ? PT Time Calculation (min) 60 min   ? Activity Tolerance Patient tolerated treatment well   ? Behavior [facility] tasks assessed/performed   ? ?  ?  ? ?  ? ? ?Past Medical History:  ?Diagnosis Date  ? Abnormal mammogram   ? Allergy   ? on zyrtec- seasonal  ? Arthritis   ? back  ? Back pain   ? Enthesopathy   ? GERD (gastroesophageal reflux disease)   ? Glaucoma   ? no drops so far early onset per pt  ? H/O degenerative disc disease   ? Hypertension   ? Prediabetes   ? Sleep apnea   ? no OSA per pt per 2 sleep studies  ? Swelling   ? ? ?Past Surgical History:  ?Procedure Laterality Date  ? ABDOMINAL HYSTERECTOMY    ? CARPAL TUNNEL RELEASE Right   ? CESAREAN SECTION    ? x1  ? CYST REMOVAL TRUNK    ? ECTOPIC PREGNANCY SURGERY    ? WISDOM TOOTH EXTRACTION    ? ? ?There were no vitals filed for this visit. ? ? Subjective Assessment - 11/23/21 1159   ? ? Subjective Back feeing much better. The press ups really help.   ? Patient Stated Goals better posture when walking and be able to tie her shoes   ? Currently in Pain? Yes   ? Pain Score 3    ? Pain Location Back   ? Pain Orientation Left   ? Pain Descriptors / Indicators Sharp   ? Pain Type Chronic pain   ? Pain Onset More than a month ago   ? Pain Frequency Constant   ? ?  ?  ? ?  ? ? ? ? ? ? ? ? ? ? ? ? ? ? ? ? ? ? ? ? [facility] Adult PT Treatment/Exercise - 11/23/21 0001   ? ?  ? Knee/Hip Exercises: Aerobic  ? Nustep L5 x 5 min   increased pain after  ?  ?  Knee/Hip Exercises: Prone  ? Hip Extension Both;1 set;10 reps   ? Other Prone Exercises prone lying after Nustep with cues for deep breaths to decrease muscle tension. Progressed to POE with deep breaths; attempted press ups but too painful;  press ups after manual traction x 10 with no increase in pain.   ? Other Prone Exercises pelvic press series 5 sec x 10;   ?  ? Modalities  ? Modalities Traction   ?  ? Traction  ? Type of Traction Lumbar   ? Min (lbs) 50   ? Max (lbs) 80   pt's wt = 230#  ? Hold Time 60   ? Rest Time 20   ? Time 10   ?  ? Manual Therapy  ? Manual Therapy Manual Traction;Taping;Joint mobilization   ? Manual therapy comments gentle PA mobs attempted to lumbar but too painful   ?  Manual Traction lumbar traction in hooklying 3 x 30 sec decreases pain from 4/10 to 1/10   ? Kinesiotex Inhibit Muscle   ?  ? Kinesiotix  ? Inhibit Muscle  to decrease lateral glide of patella   ? ?  ?  ? ?  ? ? ? ? ? ? ? ? ? ? PT Education - 11/23/21 1901   ? ? Education Details KT tape ed   ? Person(s) Educated Patient   ? Methods Explanation;Demonstration;Handout   ? Comprehension Verbalized understanding   ? ?  ?  ? ?  ? ? ? PT Short Term Goals - 11/17/21 1246   ? ?  ? PT SHORT TERM GOAL #1  ? Title Ind with intial HEP   ? Status Achieved   ? ?  ?  ? ?  ? ? ? ? PT Long Term Goals - 11/09/21 2155   ? ?  ? PT LONG TERM GOAL #1  ? Title ind with HEP and its progression   ? Time 8   ? Period Weeks   ? Status New   ? Target Date 01/04/22   ?  ? PT LONG TERM GOAL #2  ? Title Pt to demonstrate improved flexibility as evidenced by functional lumbar ROM in all planes.   ? Time 8   ? Period Weeks   ? Status New   ?  ? PT LONG TERM GOAL #3  ? Title Improved left knee ROM to 110 deg or better to help normalize stair climbing.   ? Time 6   ? Period Weeks   ? Status New   ?  ? PT LONG TERM GOAL #4  ? Title Pt able to climb stairs with a reciprocal gait pattern and no rotation.   ? Time 8   ? Period Weeks   ? Status New   ?  ? PT  LONG TERM GOAL #5  ? Title Pt to report decreased back and knee pain with ambulation and ADLs by >= 80%.   ? Time 8   ? Period Weeks   ? Status New   ?  ? Additional Long Term Goals  ? Additional Long Term Goals Yes   ?  ? PT LONG TERM GOAL #6  ? Title Improved FOTO to 55 from 49 showing functional improvement in her back   ? Time 8   ? Period Weeks   ? Status New   ? ?  ?  ? ?  ? ? ? ? ? ? ? ? Plan - 11/23/21 1231   ? ? Clinical Impression Statement Pt reporting significant decrease in pain since last visit. The press ups have helped. Warm up on NuStep increased her pain which was then decreased from 4/10 to 1/10 with manual lumbar traction. She was able to tolerate prone stabilization exercises. Initial trial of mechanical traction today. Pt still complaining of left medial knee pain. Trial of KT to inhibit lateral glide of patella applied today.   ? Comorbidities HTN, obese, DDD , left knee pain, enthesopathy   ? PT Treatment/Interventions ADLs/Self Care Home Management;Aquatic Therapy;Cryotherapy;Electrical Stimulation;Iontophoresis 4mg /ml Dexamethasone;Moist Heat;Traction;Neuromuscular re-education;Therapeutic exercise;Therapeutic activities;Stair training;Patient/family education;Manual techniques;Dry needling;Taping;Spinal Manipulations;Joint Manipulations   ? PT Next Visit Plan Assess response to mehcanical traction and increase pull if positive; Assess KT to knee; continue with extension based exercises. Teach ADL Modifications and body mechanics. progress HEP with stretches for HF/quads/lumbar/QL; DN/MT to bil gluteals/lumbar/ADDuctors/quads.   ? PT Home Exercise Plan [email or web address]   ? ?  ?  ? ?  ? ? ?  Patient will benefit from skilled therapeutic intervention in order to improve the following deficits and impairments:  Decreased range of motion, Increased muscle spasms, Decreased activity tolerance, Pain, Impaired flexibility, Postural dysfunction, Decreased strength ? ?Visit Diagnosis: ?Other low back  pain ? ?Chronic pain of left knee ? ?Abnormal posture ? ?Stiffness of left knee, not elsewhere classified ? ?Cramp and spasm ? ? ? ? ?Problem List ?Patient Active Problem List  ? Diagnosis Date Noted  ? Prediabetes 11/03/2019  ? Weight gain 11/03/2019  ? Insomnia 11/03/2019  ? Fall 11/03/2019  ? Essential hypertension 11/03/2019  ? ? ?Solon Palm, PT ?11/23/2021, 7:14 PM ? ?[place] [facility] ?[address] ?[place], [place], [address] ?Phone: [phone]   Fax:  [phone] ? ?Name: Amy Diaz ?MRN: [id number] ?Date of Birth: 06/24/1971 ? ? ? ?

## 2021-11-23 NOTE — Patient Instructions (Signed)
Access Code: [email or web address] ?URL: [email or web address] ?Date: 11/23/2021 ?Prepared by: Raynelle Fanning ? ?Exercises ?- Supine Hamstring Stretch with Strap  - 2 x daily - 7 x weekly - 1 sets - 3 reps - 30-60 sec hold ?- Supine ITB Stretch with Strap  - 2 x daily - 7 x weekly - 1 sets - 3 reps - 30-60 sec hold ?- Supine Lower Trunk Rotation  - 2 x daily - 7 x weekly - 1 sets - 5 reps - 10 sec hold ?- Supine Piriformis Stretch with Leg Straight  - 2 x daily - 7 x weekly - 1 sets - 3 reps - 30 sec hold ?- Seated Hip Flexor Stretch  - 2 x daily - 7 x weekly - 1 sets - 3 reps - 30-60 sec hold ?- Supine Quadriceps Stretch with Strap on Table  - 2 x daily - 7 x weekly - 1 sets - 3 reps - 30-60 sec hold ?- Seated Correct Posture  ?- Lying Prone with 1 Pillow  - 2 x daily -  minutes hold ?- Lying Prone  - 2 x daily - 5 minutes hold ?- Static Prone on Elbows  - 2 x daily - 5 minutes hold ?- Prone Press Up  - 4 x daily - 10-15 reps - 1 second hold ?- Standing Lumbar Extension with Counter  - 4 x daily - 1 sets - 10-15 reps - 1 second hold ?- Standing Lumbar Extension at Wall - Forearms  - 4 x daily - 1 sets - 10-15 reps - 1 second hold ? ?Patient Education ?- Kinesiology tape ?

## 2021-11-25 ENCOUNTER — Encounter: Payer: Self-pay | Admitting: Medical-Surgical

## 2021-11-25 MED ORDER — METHOCARBAMOL 500 MG PO TABS: 500.0000 mg | ORAL_TABLET | Freq: Three times a day (TID) | ORAL | 0 refills | Status: DC

## 2021-11-29 ENCOUNTER — Ambulatory Visit (AMBULATORY_SURGERY_CENTER): Payer: No Typology Code available for payment source | Admitting: Gastroenterology

## 2021-11-29 ENCOUNTER — Encounter: Payer: Self-pay | Admitting: Gastroenterology

## 2021-11-29 VITALS — BP 135/79 | HR 66 | Temp 97.1°F | Resp 14 | Ht 63.0 in | Wt 236.0 lb

## 2021-11-29 DIAGNOSIS — Z1211 Encounter for screening for malignant neoplasm of colon: Secondary | ICD-10-CM

## 2021-11-29 MED ORDER — SODIUM CHLORIDE 0.9 % IV SOLN: 500.0000 mL | Freq: Once | INTRAVENOUS | Status: DC

## 2021-11-29 NOTE — Progress Notes (Signed)
History and Physical: ? This patient presents for endoscopic testing for: ?Encounter Diagnosis  ?Name Primary?  ? Special screening for malignant neoplasms, colon Yes  ? ? ?First screening exam - average risk. ?Patient denies chronic abdominal pain, rectal bleeding, constipation or diarrhea. ? ? ?Patient is otherwise without complaints or active issues today. ? ? ?Past Medical History: ?Past Medical History:  ?Diagnosis Date  ? Abnormal mammogram   ? Allergy   ? on zyrtec- seasonal  ? Arthritis   ? back  ? Back pain   ? Enthesopathy   ? GERD (gastroesophageal reflux disease)   ? Glaucoma   ? no drops so far early onset per pt  ? H/O degenerative disc disease   ? Hypertension   ? Prediabetes   ? Sleep apnea   ? no OSA per pt per 2 sleep studies  ? Swelling   ? ? ? ?Past Surgical History: ?Past Surgical History:  ?Procedure Laterality Date  ? ABDOMINAL HYSTERECTOMY    ? CARPAL TUNNEL RELEASE Right   ? CESAREAN SECTION    ? x1  ? CYST REMOVAL TRUNK    ? ECTOPIC PREGNANCY SURGERY    ? WISDOM TOOTH EXTRACTION    ? ? ?Allergies: ?Allergies  ?Allergen Reactions  ? Lisinopril Cough and Other (See Comments)  ?  cough  ? Sumatriptan Other (See Comments)  ?  Causes chest tightness ?Chest tightness  ? ? ?Outpatient Meds: ?Current Outpatient Medications  ?Medication Sig Dispense Refill  ? cetirizine (ZYRTEC) 10 MG tablet Take 1 tablet (10 mg total) by mouth daily. 30 tablet 0  ? chlorhexidine (PERIDEX) 0.12 % solution SMARTSIG:15 Milliliter(s) By Mouth Twice a Week    ? GINKGO BILOBA PO Take by mouth.    ? losartan-hydrochlorothiazide (HYZAAR) 100-25 MG tablet Take 1 tablet by mouth daily 90 tablet 0  ? meloxicam (MOBIC) 15 MG tablet Take 1 tablet (15 mg total) by mouth daily. 30 tablet 0  ? methocarbamol (ROBAXIN) 500 MG tablet Take 1 tablet (500 mg total) by mouth 3 (three) times daily. 90 tablet 0  ? metoprolol succinate (TOPROL-XL) 25 MG 24 hr tablet Take 1 tablet (25 mg total) by mouth daily. NEEDS APPOINTMENT FOR FURTHER  REFILLS 30 tablet 0  ? Multiple Vitamin (MULTIVITAMIN) tablet Take 1 tablet by mouth daily.    ? omeprazole (PRILOSEC) 40 MG capsule TAKE 1 CAPSULE BY MOUTH ONCE DAILY BEFORE DINNER 90 capsule 1  ? famotidine (PEPCID) 20 MG tablet TAKE 1 TABLET BY MOUTH AT BEDTIME 30 tablet 0  ? Semaglutide-Weight Management (WEGOVY) 1.7 MG/0.75ML SOAJ Inject 1.7 mg into the skin once a week. 3 mL 0  ? traZODone (DESYREL) 50 MG tablet Take 0.5-1 tablets (25-50 mg total) by mouth at bedtime as needed for sleep. 90 tablet 1  ? Vitamin D, Ergocalciferol, (DRISDOL) 1.25 MG (50000 UNIT) CAPS capsule Take 1 capsule by mouth every 7 days. 4 capsule 0  ? ?Current Facility-Administered Medications  ?Medication Dose Route Frequency Provider Last Rate Last Admin  ? 0.9 %  sodium chloride infusion  500 mL Intravenous Once Sherrilyn Rist, MD      ? ? ? ? ?___________________________________________________________________ ?Objective  ? ?Exam: ? ?BP (!) 150/77 (BP Location: Right Arm, Patient Position: Sitting, Cuff Size: Normal)   Pulse 72   Temp (!) 97.1 ?F (36.2 ?C) (Temporal)   Ht 5\' 3"  (1.6 m)   Wt 236 lb (107 kg)   SpO2 98%   BMI 41.81 kg/m?  ? ?  CV: RRR without murmur, S1/S2 ?Resp: clear to auscultation bilaterally, normal RR and effort noted ?GI: soft, no tenderness, with active bowel sounds. ? ? ?Assessment: ?Encounter Diagnosis  ?Name Primary?  ? Special screening for malignant neoplasms, colon Yes  ? ? ? ?Plan: ?Colonoscopy ? The benefits and risks of the planned procedure were described in detail with the patient or (when appropriate) their health care proxy.  Risks were outlined as including, but not limited to, bleeding, infection, perforation, adverse medication reaction leading to cardiac or pulmonary decompensation, pancreatitis (if ERCP).  The limitation of incomplete mucosal visualization was also discussed.  No guarantees or warranties were given. ? ? ? ?The patient is appropriate for an endoscopic procedure in the  ambulatory setting. ? ? - Amada Jupiter, MD ? ? ? ? ?

## 2021-11-29 NOTE — Progress Notes (Signed)
Vitals-CW  Pt's states no medical or surgical changes since previsit or office visit. 

## 2021-11-29 NOTE — Patient Instructions (Signed)
Please read handouts provided. ?Continue present medications. ?Repeat colonoscopy in 10 years for screening. ? ? ?YOU HAD AN ENDOSCOPIC PROCEDURE TODAY AT THE [facility]:   Refer to the procedure report that was given to you for any specific questions about what was found during the examination.  If the procedure report does not answer your questions, please call your gastroenterologist to clarify.  If you requested that your care partner not be given the details of your procedure findings, then the procedure report has been included in a sealed envelope for you to review at your convenience later. ? ?YOU SHOULD EXPECT: Some feelings of bloating in the abdomen. Passage of more gas than usual.  Walking can help get rid of the air that was put into your GI tract during the procedure and reduce the bloating. If you had a lower endoscopy (such as a colonoscopy or flexible sigmoidoscopy) you may notice spotting of blood in your stool or on the toilet paper. If you underwent a bowel prep for your procedure, you may not have a normal bowel movement for a few days. ? ?Please Note:  You might notice some irritation and congestion in your nose or some drainage.  This is from the oxygen used during your procedure.  There is no need for concern and it should clear up in a day or so. ? ?SYMPTOMS TO REPORT IMMEDIATELY: ? ?Following lower endoscopy (colonoscopy or flexible sigmoidoscopy): ? Excessive amounts of blood in the stool ? Significant tenderness or worsening of abdominal pains ? Swelling of the abdomen that is new, acute ? Fever of 100?F or higher ? ? ?For urgent or emergent issues, a gastroenterologist can be reached at any hour by calling [phone]. ?Do not use MyChart messaging for urgent concerns.  ? ? ?DIET:  We do recommend a small meal at first, but then you may proceed to your regular diet.  Drink plenty of fluids but you should avoid alcoholic beverages for 24 hours. ? ?ACTIVITY:  You should  plan to take it easy for the rest of today and you should NOT DRIVE or use heavy machinery until tomorrow (because of the sedation medicines used during the test).   ? ?FOLLOW UP: ?Our staff will call the number listed on your records 48-72 hours following your procedure to check on you and address any questions or concerns that you may have regarding the information given to you following your procedure. If we do not reach you, we will leave a message.  We will attempt to reach you two times.  During this call, we will ask if you have developed any symptoms of COVID 19. If you develop any symptoms (ie: fever, flu-like symptoms, shortness of breath, cough etc.) before then, please call [phone].  If you test positive for Covid 19 in the 2 weeks post procedure, please call and report this information to us.   ? ?If any biopsies were taken you will be contacted by phone or by letter within the next 1-3 weeks.  Please call us at [phone] if you have not heard about the biopsies in 3 weeks.  ? ? ?SIGNATURES/CONFIDENTIALITY: ?You and/or your care partner have signed paperwork which will be entered into your electronic medical record.  These signatures attest to the fact that that the information above on your After Visit Summary has been reviewed and is understood.  Full responsibility of the confidentiality of this discharge information lies with you and/or your care-partner.  ?

## 2021-11-29 NOTE — Op Note (Signed)
[facility] ?Patient Name: Amy Diaz ?Procedure Date: 11/29/2021 2:09 PM ?MRN: [id number] ?Endoscopist: Starr Lake. Myrtie Neither , MD ?Age: 51 ?Referring MD:  ?Date of Birth: 09/17/1970 ?Gender: Female ?Account #: [id number] ?Procedure:                Colonoscopy ?Indications:              Screening for colorectal malignant neoplasm, This  ?                          is the patient's first colonoscopy ?Medicines:                Monitored Anesthesia Care ?Procedure:                Pre-Anesthesia Assessment: ?                          - Prior to the procedure, a History and Physical  ?                          was performed, and patient medications and  ?                          allergies were reviewed. The patient's tolerance of  ?                          previous anesthesia was also reviewed. The risks  ?                          and benefits of the procedure and the sedation  ?                          options and risks were discussed with the patient.  ?                          All questions were answered, and informed consent  ?                          was obtained. Prior Anticoagulants: The patient has  ?                          taken no previous anticoagulant or antiplatelet  ?                          agents. ASA Grade Assessment: III - A patient with  ?                          severe systemic disease. After reviewing the risks  ?                          and benefits, the patient was deemed in  ?                          satisfactory condition to undergo the procedure. ?  After obtaining informed consent, the colonoscope  ?                          was passed under direct vision. Throughout the  ?                          procedure, the patient's blood pressure, pulse, and  ?                          oxygen saturations were monitored continuously. The  ?                          CF [id number] #[id number] was introduced through the anus  ?                          and advanced to the the  cecum, identified by  ?                          appendiceal orifice and ileocecal valve. The  ?                          colonoscopy was performed without difficulty. The  ?                          patient tolerated the procedure well. The quality  ?                          of the bowel preparation was excellent. The  ?                          ileocecal valve, appendiceal orifice, and rectum  ?                          were photographed. The bowel preparation used was  ?                          SUPREP. ?Scope In: 2:46:50 PM ?Scope Out: 3:00:42 PM ?Scope Withdrawal Time: 0 hours 10 minutes 19 seconds  ?Total Procedure Duration: 0 hours 13 minutes 52 seconds  ?Findings:                 The perianal and digital rectal examinations were  ?                          normal. ?                          Repeat examination of right colon under NBI  ?                          performed. ?                          Multiple diverticula were found in the distal  ?  transverse colon. ?                          The exam was otherwise without abnormality on  ?                          direct and retroflexion views. ?Complications:            No immediate complications. ?Estimated Blood Loss:     Estimated blood loss: none. ?Impression:               - Diverticulosis in the distal transverse colon. ?                          - The examination was otherwise normal on direct  ?                          and retroflexion views. ?                          - No specimens collected. ?Recommendation:           - Patient has a contact number available for  ?                          emergencies. The signs and symptoms of potential  ?                          delayed complications were discussed with the  ?                          patient. Return to normal activities tomorrow.  ?                          Written discharge instructions were provided to the  ?                          patient. ?                           - Resume previous diet. ?                          - Continue present medications. ?                          - Repeat colonoscopy in 10 years for screening  ?                          purposes. ?Jaia Alonge L. Myrtie Neither, MD ?11/29/2021 3:05:05 PM ?This report has been signed electronically. ?

## 2021-11-29 NOTE — Progress Notes (Signed)
PT taken to PACU. Monitors in place. VSS. Report given to RN. 

## 2021-11-30 ENCOUNTER — Ambulatory Visit: Payer: No Typology Code available for payment source | Attending: Medical-Surgical | Admitting: Physical Therapy

## 2021-11-30 DIAGNOSIS — M5459 Other low back pain: Secondary | ICD-10-CM | POA: Insufficient documentation

## 2021-11-30 DIAGNOSIS — G8929 Other chronic pain: Secondary | ICD-10-CM | POA: Insufficient documentation

## 2021-11-30 DIAGNOSIS — R293 Abnormal posture: Secondary | ICD-10-CM | POA: Insufficient documentation

## 2021-11-30 DIAGNOSIS — M25662 Stiffness of left knee, not elsewhere classified: Secondary | ICD-10-CM | POA: Diagnosis present

## 2021-11-30 DIAGNOSIS — R252 Cramp and spasm: Secondary | ICD-10-CM | POA: Diagnosis present

## 2021-11-30 DIAGNOSIS — M25562 Pain in left knee: Secondary | ICD-10-CM | POA: Diagnosis present

## 2021-11-30 NOTE — Therapy (Addendum)
[place], [place], [address] Phone: [phone]   Fax:  [phone]  Physical Therapy Treatment AND Discharge Summary  Patient Details  Name: Amy Diaz MRN: [id number] Date of Birth: 31-Jul-1971 Referring Provider (PT): Samuel Bouche NP   Encounter Date: 11/30/2021   PT End of Session - 11/30/21 1105     Visit Number 6    Date for PT Re-Evaluation 01/04/22    Authorization Type [facility]    PT Start Time 1105    PT Stop Time 1145    PT Time Calculation (min) 40 min    Activity Tolerance Patient tolerated treatment well    Behavior During Therapy WFL for tasks assessed/performed             Past Medical History:  Diagnosis Date   Abnormal mammogram    Allergy    on zyrtec- seasonal   Arthritis    back   Back pain    Enthesopathy    GERD (gastroesophageal reflux disease)    Glaucoma    no drops so far early onset per pt   H/O degenerative disc disease    Hypertension    Prediabetes    Sleep apnea    no OSA per pt per 2 sleep studies   Swelling     Past Surgical History:  Procedure Laterality Date   ABDOMINAL HYSTERECTOMY     CARPAL TUNNEL RELEASE Right    CESAREAN SECTION     x1   CYST REMOVAL TRUNK     ECTOPIC PREGNANCY SURGERY     WISDOM TOOTH EXTRACTION      There were no vitals filed for this visit.   Subjective Assessment - 11/30/21 1106     Subjective Pt states she thinks she over did it at work -- her back has been hurting since Friday. Pt states that the back pain has been managed with heat and muscle relaxer. Pt states that her knee has been popping painfully. Pt reports she is not sure about the traction since that evening she felt it a little bit.    Pertinent History HTN, enthesopathy    How long can you walk comfortably? 10 min    Diagnostic tests xrays: multilevel DDD    Patient Stated Goals better posture when walking and be able to tie her shoes     Currently in Pain? Yes    Pain Score 6     Pain Location Back    Pain Orientation Mid    Pain Score 6    Pain Location Knee    Pain Orientation Left                [facility] PT Assessment - 11/30/21 0001       Assessment   Medical Diagnosis Chronic Rt LBP without sciatica; acute left knee pain; Rt hip pain    Referring Provider (PT) Samuel Bouche NP    Onset Date/Surgical Date 10/12/21    Hand Dominance Left    Next MD Visit May                           [facility] Adult PT Treatment/Exercise - 11/30/21 0001       Knee/Hip Exercises: Stretches   Quad Stretch Right;Left;2 reps;30 seconds    Hip Flexor Stretch Right;Left;2 reps;30 seconds    Hip Flexor Stretch Limitations standing      Knee/Hip Exercises: Aerobic  Nustep L5 x 5 min      Knee/Hip Exercises: Prone   Hamstring Curl 2 sets;10 reps    Hamstring Curl Limitations red tband    Hip Extension Both;10 reps;2 sets    Hip Extension Limitations knees flexed    Other Prone Exercises prone lying after Nustep with cues for deep breaths to decrease muscle tension x 5 min      Modalities   Modalities Electrical Stimulation;Moist Heat      Moist Heat Therapy   Number Minutes Moist Heat 15 Minutes    Moist Heat Location Lumbar Spine   while performing mobs/manual work     Cabin crew Action TENs    Electrical Stimulation Parameters to pt tolerance    Electrical Stimulation Goals Pain;Tone      Manual Therapy   Manual therapy comments gentle CPA and UPAs along lumbar. gentle hip mobilizations for improved hip extension    Soft tissue mobilization TPR and STM lumbar paraspinals and glutes    Kinesiotex Facilitate Muscle      Kinesiotix   Facilitate Muscle  facilitate L quad                       PT Short Term Goals - 11/17/21 1246       PT SHORT TERM GOAL #1   Title Ind with intial HEP    Status  Achieved               PT Long Term Goals - 11/09/21 2155       PT LONG TERM GOAL #1   Title ind with HEP and its progression    Time 8    Period Weeks    Status New    Target Date 01/04/22      PT LONG TERM GOAL #2   Title Pt to demonstrate improved flexibility as evidenced by functional lumbar ROM in all planes.    Time 8    Period Weeks    Status New      PT LONG TERM GOAL #3   Title Improved left knee ROM to 110 deg or better to help normalize stair climbing.    Time 6    Period Weeks    Status New      PT LONG TERM GOAL #4   Title Pt able to climb stairs with a reciprocal gait pattern and no rotation.    Time 8    Period Weeks    Status New      PT LONG TERM GOAL #5   Title Pt to report decreased back and knee pain with ambulation and ADLs by >= 80%.    Time 8    Period Weeks    Status New      Additional Long Term Goals   Additional Long Term Goals Yes      PT LONG TERM GOAL #6   Title Improved FOTO to 55 from 49 showing functional improvement in her back    Time 8    Period Weeks    Status New                   Plan - 11/30/21 1159     Clinical Impression Statement Pt's pain flared up since Friday. Treatment thus focused primarily on pain management. Improved after prone, stretching hip flexors/quads and continuing hip extensor and hamstring strengthening. Continued KT tape for  medial knee pain.    Comorbidities HTN, obese, DDD , left knee pain, enthesopathy    PT Treatment/Interventions ADLs/Self Care Home Management;Aquatic Therapy;Cryotherapy;Electrical Stimulation;Iontophoresis 28m/ml Dexamethasone;Moist Heat;Traction;Neuromuscular re-education;Therapeutic exercise;Therapeutic activities;Stair training;Patient/family education;Manual techniques;Dry needling;Taping;Spinal Manipulations;Joint Manipulations    PT Next Visit Plan Assess response to mehcanical traction and increase pull if positive; Assess KT to knee; continue with extension  based exercises. Teach ADL Modifications and body mechanics. progress HEP and strengthening with stretches for HF/quads/lumbar/QL; DN/MT to bil gluteals/lumbar/ADDuctors/quads.    PT Home Exercise Plan [id number]    Consulted and Agree with Plan of Care Patient             Patient will benefit from skilled therapeutic intervention in order to improve the following deficits and impairments:  Decreased range of motion, Increased muscle spasms, Decreased activity tolerance, Pain, Impaired flexibility, Postural dysfunction, Decreased strength  Visit Diagnosis: Other low back pain  Chronic pain of left knee  Abnormal posture  Stiffness of left knee, not elsewhere classified  Cramp and spasm     Problem List Patient Active Problem List   Diagnosis Date Noted   Prediabetes 11/03/2019   Weight gain 11/03/2019   Insomnia 11/03/2019   Fall 11/03/2019   Essential hypertension 11/03/2019    GBronx-[facility] DivisionApril Ma L [place] PT, DPT 11/30/2021, 12:03 PM  [facility] [place] [address]Phone: [phone]  Fax:  [phone] Name: LDelise SimensonMRN: [id number] of Birth: 313-Oct-1972 PHYSICAL THERAPY DISCHARGE SUMMARY  Visits from Start of Care: 6  Current functional level related to goals / functional outcomes: unknown   Remaining deficits: unknown   Education / Equipment: HEP   Patient agrees to discharge. Patient goals were not met. Patient is being discharged due to not returning since the last visit.  JMadelyn Flavors PT 04/04/22 9:29 AM  [facility] [address] [id number] [phone](office) [phone](fax)

## 2021-12-01 ENCOUNTER — Telehealth: Payer: Self-pay

## 2021-12-01 NOTE — Telephone Encounter (Signed)
Attempted to reach patient for post-procedure f/u call. No answer. Left message for her to please not hesitate to call us if she has any questions/concerns regarding her care. 

## 2021-12-01 NOTE — Telephone Encounter (Signed)
Attempted to reach patient for post-procedure f/u call. No answer. 2nd attempt. Left message. ?

## 2021-12-07 ENCOUNTER — Other Ambulatory Visit (INDEPENDENT_AMBULATORY_CARE_PROVIDER_SITE_OTHER): Payer: Self-pay | Admitting: Family Medicine

## 2021-12-07 ENCOUNTER — Other Ambulatory Visit (INDEPENDENT_AMBULATORY_CARE_PROVIDER_SITE_OTHER): Payer: Self-pay

## 2021-12-07 ENCOUNTER — Other Ambulatory Visit (HOSPITAL_COMMUNITY): Payer: Self-pay

## 2021-12-07 ENCOUNTER — Other Ambulatory Visit: Payer: Self-pay | Admitting: Medical-Surgical

## 2021-12-07 DIAGNOSIS — I10 Essential (primary) hypertension: Secondary | ICD-10-CM

## 2021-12-07 DIAGNOSIS — E559 Vitamin D deficiency, unspecified: Secondary | ICD-10-CM

## 2021-12-08 ENCOUNTER — Ambulatory Visit (INDEPENDENT_AMBULATORY_CARE_PROVIDER_SITE_OTHER): Payer: No Typology Code available for payment source | Admitting: Nurse Practitioner

## 2021-12-08 ENCOUNTER — Other Ambulatory Visit (HOSPITAL_COMMUNITY): Payer: Self-pay

## 2021-12-08 MED ORDER — METOPROLOL SUCCINATE ER 25 MG PO TB24
25.0000 mg | ORAL_TABLET | Freq: Every day | ORAL | 0 refills | Status: DC
  Filled 2021-12-08: qty 30, 30d supply, fill #0

## 2021-12-08 MED ORDER — FAMOTIDINE 20 MG PO TABS
ORAL_TABLET | Freq: Every day | ORAL | 0 refills | Status: DC
  Filled 2021-12-08: qty 30, 30d supply, fill #0

## 2021-12-12 NOTE — Progress Notes (Signed)
?  HPI with pertinent ROS:  ? ?CC: Follow-up musculoskeletal pain ? ?HPI: ?Pleasant 51 year old female presenting today to follow-up on right hip, left knee, and low back pain. Has been going to physical therapy for several weeks and has noted that muscles are very tight. Has tried several things but thinks the dry needling works better than some others. Feels that the methocarbamol works better than meloxicam.  Has not tried any exercises outside of physical therapy recommendations such as yoga, Pilates, or another flexibility or stretching program. ? ?Her left knee does continue to cause issues especially after walking for any length of time.  She notes that it is popping painfully and is very tender on the medial lower portion of the knee just distal and medial to the patella.  She also has some tenderness around the posterior aspect of the knee.  Has had some difficulty with full extension as well as full flexion. ? ?I reviewed the past medical history, family history, social history, surgical history, and allergies today and no changes were needed.  Please see the problem list section below in epic for further details. ? ?Physical exam:  ? ?General: Well Developed, well nourished, and in no acute distress.  ?Neuro: Alert and oriented x3.  ?HEENT: Normocephalic, atraumatic.  ?Skin: Warm and dry. ?Cardiac: Regular rate and rhythm, no murmurs rubs or gallops, no lower extremity edema.  ?Respiratory: Clear to auscultation bilaterally. Not using accessory muscles, speaking in full sentences. ? ?Impression and Recommendations:   ? ?1. Right hip pain ?2. Chronic right-sided low back pain with right-sided sciatica ?Okay to use meloxicam 15 mg daily but may use ibuprofen 800 mg 3 times daily if preferred.  Continue methocarbamol as needed.  Continue physical therapy as directed.  Ask physical therapy regarding a home stretching program to increase flexibility such as that described above. ?- meloxicam (MOBIC) 15 MG  tablet; Take 1 tablet by mouth daily.  Dispense: 30 tablet; Refill: 0 ? ?3. Acute pain of left knee ?Meloxicam as needed.  Patient has failed 6 full weeks of physical therapy and anti-inflammatories and is presenting with mechanical symptoms.  Pursuing MRI of the left knee without contrast for further evaluation. ?- meloxicam (MOBIC) 15 MG tablet; Take 1 tablet by mouth daily.  Dispense: 30 tablet; Refill: 0 ?- MR Knee Left  Wo Contrast; Future ? ?4.  Encounter for weight management ?Unfortunately, medical weight management was cost prohibitive for her in the setting of physical therapy cost.  She did not feels like she was making great progress with medical weight management and felt like the diet was very boring.  Discussed multiple options today to continue pursuing her weight loss.  Her blood pressure looks great and is very well controlled today.  We will go ahead and restart phentermine 37.5 mg daily.  Advised that we can only do this for approximately 3 months before tapering off.  Patient is aware and verbalized understanding that this is a very short-term situation and that she will have to make several changes in her diet and lifestyle to be able to continue weight loss after the medication has been discontinued. ? ?Return in about 4 weeks (around 01/10/2022) for weight check. ?___________________________________________ ?Thayer Ohm, DNP, APRN, FNP-BC ?Primary Care and Sports Medicine ?[facility] ?

## 2021-12-13 ENCOUNTER — Encounter: Payer: Self-pay | Admitting: Medical-Surgical

## 2021-12-13 ENCOUNTER — Ambulatory Visit (INDEPENDENT_AMBULATORY_CARE_PROVIDER_SITE_OTHER): Payer: No Typology Code available for payment source | Admitting: Medical-Surgical

## 2021-12-13 ENCOUNTER — Other Ambulatory Visit (HOSPITAL_COMMUNITY): Payer: Self-pay

## 2021-12-13 VITALS — BP 113/79 | HR 70 | Resp 20 | Ht 63.0 in | Wt 242.7 lb

## 2021-12-13 DIAGNOSIS — Z7689 Persons encountering health services in other specified circumstances: Secondary | ICD-10-CM

## 2021-12-13 DIAGNOSIS — M25551 Pain in right hip: Secondary | ICD-10-CM

## 2021-12-13 DIAGNOSIS — M5441 Lumbago with sciatica, right side: Secondary | ICD-10-CM | POA: Diagnosis not present

## 2021-12-13 DIAGNOSIS — G8929 Other chronic pain: Secondary | ICD-10-CM

## 2021-12-13 DIAGNOSIS — M25562 Pain in left knee: Secondary | ICD-10-CM

## 2021-12-13 MED ORDER — MELOXICAM 15 MG PO TABS
15.0000 mg | ORAL_TABLET | Freq: Every day | ORAL | 0 refills | Status: DC
  Filled 2021-12-13: qty 30, 30d supply, fill #0

## 2021-12-13 MED ORDER — PHENTERMINE HCL 37.5 MG PO TABS
ORAL_TABLET | ORAL | 0 refills | Status: DC
  Filled 2021-12-13: qty 30, 30d supply, fill #0

## 2021-12-17 ENCOUNTER — Ambulatory Visit (INDEPENDENT_AMBULATORY_CARE_PROVIDER_SITE_OTHER): Payer: No Typology Code available for payment source

## 2021-12-17 DIAGNOSIS — M25562 Pain in left knee: Secondary | ICD-10-CM

## 2021-12-17 DIAGNOSIS — G8929 Other chronic pain: Secondary | ICD-10-CM | POA: Diagnosis not present

## 2021-12-22 ENCOUNTER — Ambulatory Visit: Payer: No Typology Code available for payment source | Admitting: Physical Therapy

## 2021-12-27 ENCOUNTER — Ambulatory Visit: Payer: No Typology Code available for payment source | Admitting: Physical Therapy

## 2021-12-28 ENCOUNTER — Other Ambulatory Visit (HOSPITAL_COMMUNITY): Payer: Self-pay

## 2021-12-28 ENCOUNTER — Other Ambulatory Visit: Payer: Self-pay | Admitting: Medical-Surgical

## 2021-12-28 MED ORDER — CETIRIZINE HCL 10 MG PO TABS
10.0000 mg | ORAL_TABLET | Freq: Every day | ORAL | 0 refills | Status: DC
Start: 2021-12-28 — End: 2022-02-04
  Filled 2021-12-28: qty 30, 30d supply, fill #0

## 2021-12-29 ENCOUNTER — Encounter: Payer: No Typology Code available for payment source | Admitting: Physical Therapy

## 2022-01-09 NOTE — Progress Notes (Signed)
   Established Patient Office Visit  Subjective   Patient ID: Suly Vukelich, female   DOB: 05-20-1971 Age: 51 y.o. MRN: [id number]   No chief complaint on file.   HPI Pleasant 51 year old female presenting today to follow-up on weight loss efforts.  Approximately 4 weeks ago, she restarted phentermine 37.5 mg daily and has been taking this as prescribed, tolerating well without side effects.  ROS    Objective:    There were no vitals filed for this visit.   Physical Exam   No results found for this or any previous visit (from the past 24 hour(s)).   {Labs (Optional):23779}  The 10-year ASCVD risk score (Arnett DK, et al., 2019) is: 1.8%   Values used to calculate the score:     Age: 1 years     Sex: Female     Is Non-Hispanic African American: Yes     Diabetic: No     Tobacco smoker: No     Systolic Blood Pressure: 113 mmHg     Is BP treated: Yes     HDL Cholesterol: 81 mg/dL     Total Cholesterol: 233 mg/dL   Assessment & Plan:   No problem-specific Assessment & Plan notes found for this encounter.   No follow-ups on file.  ___________________________________________ Thayer Ohm, DNP, APRN, FNP-BC Primary Care and Sports Medicine [facility] [place]

## 2022-01-10 ENCOUNTER — Other Ambulatory Visit: Payer: Self-pay | Admitting: Medical-Surgical

## 2022-01-10 ENCOUNTER — Other Ambulatory Visit (HOSPITAL_COMMUNITY): Payer: Self-pay

## 2022-01-10 ENCOUNTER — Telehealth: Payer: Self-pay | Admitting: Medical-Surgical

## 2022-01-10 ENCOUNTER — Encounter: Payer: No Typology Code available for payment source | Admitting: Medical-Surgical

## 2022-01-10 MED ORDER — PHENTERMINE HCL 37.5 MG PO TABS
ORAL_TABLET | ORAL | 0 refills | Status: DC
  Filled 2022-01-10: qty 30, 30d supply, fill #0

## 2022-01-10 NOTE — Telephone Encounter (Signed)
Pt informed of RX and condolences conveyed.  Pt expressed understanding.  Amy Diaz, CMA

## 2022-01-10 NOTE — Telephone Encounter (Signed)
Pt had to reschedule her appt today because her brother in-law just passed away. Her appt is scheduled for 7/11 and she is requesting a refill of Phentermine until then.

## 2022-01-21 ENCOUNTER — Other Ambulatory Visit: Payer: Self-pay | Admitting: Medical-Surgical

## 2022-01-21 DIAGNOSIS — M25562 Pain in left knee: Secondary | ICD-10-CM

## 2022-01-21 DIAGNOSIS — G8929 Other chronic pain: Secondary | ICD-10-CM

## 2022-01-21 DIAGNOSIS — M25551 Pain in right hip: Secondary | ICD-10-CM

## 2022-01-23 ENCOUNTER — Other Ambulatory Visit (HOSPITAL_COMMUNITY): Payer: Self-pay

## 2022-01-23 MED ORDER — MELOXICAM 15 MG PO TABS
15.0000 mg | ORAL_TABLET | Freq: Every day | ORAL | 1 refills | Status: DC
  Filled 2022-01-23: qty 30, 30d supply, fill #0
  Filled 2022-02-04: qty 30, 30d supply, fill #1

## 2022-02-04 ENCOUNTER — Other Ambulatory Visit: Payer: Self-pay | Admitting: Medical-Surgical

## 2022-02-04 ENCOUNTER — Other Ambulatory Visit (HOSPITAL_COMMUNITY): Payer: Self-pay

## 2022-02-04 ENCOUNTER — Other Ambulatory Visit (INDEPENDENT_AMBULATORY_CARE_PROVIDER_SITE_OTHER): Payer: Self-pay

## 2022-02-06 ENCOUNTER — Other Ambulatory Visit (HOSPITAL_COMMUNITY): Payer: Self-pay

## 2022-02-06 MED ORDER — CETIRIZINE HCL 10 MG PO TABS
10.0000 mg | ORAL_TABLET | Freq: Every day | ORAL | 0 refills | Status: DC
  Filled 2022-02-06: qty 30, 30d supply, fill #0

## 2022-02-06 MED ORDER — METHOCARBAMOL 500 MG PO TABS
500.0000 mg | ORAL_TABLET | Freq: Three times a day (TID) | ORAL | 0 refills | Status: DC
Start: 2022-02-06 — End: 2022-05-24
  Filled 2022-02-06: qty 90, 30d supply, fill #0

## 2022-02-06 NOTE — Telephone Encounter (Signed)
Patient has a virtual appointment tomorrow with you.

## 2022-02-06 NOTE — Progress Notes (Unsigned)
Established Patient Office Visit  Subjective   Patient ID: Amy Diaz, female   DOB: 10-Dec-1970 Age: 51 y.o. MRN: [id number]   Chief Complaint  Patient presents with   Weight Check    HPI Pleasant 51 year old female presenting today for the following:  Weight check: Has been taking phentermine 37.5 mg daily, tolerating well without side effects.  Has not seen great benefit with phentermine over the last month outside of some appetite suppression.  Notes that she has started drinking greater than 113 ounces of water per day.  She normally does a fruit shake in the morning and has a sandwich at lunch.  Sometimes she even skips dinner altogether.  Not currently exercising.  Stop physical therapy due to her family issues (see below).  Has gotten frustrated with the efforts at weight loss and is interested in resuming Wegovy since she feels like this works better than the phentermine.  She is under quite a bit of stress at this point.  Her mother was recently diagnosed with vascular dementia and she is now working to get everything set up as a healthcare power of attorney and trying to get financial power of attorney.  Notes that there are a lot of financial issues surrounding her mom and she recently learned the house was in foreclosure.  In addition, she had another close family member passed away due to cancer.  She is not doing counseling at this point and is not currently taking any medications for mood management outside of trazodone to help sleep.  Denies SI/HI.  Continues to have issues with her left knee.  This has been evaluated by x-ray as well as MRI and showed tricompartmental arthritis and a moderate joint effusion.  Wants to know if she should go ahead and proceed with being evaluated by Dr. Benjamin Stain as previously recommended.  Objective:    Vitals:   02/07/22 1132  BP: 127/80  Pulse: 82  Resp: 20  Height: 5\' 3"  (1.6 m)  Weight: 243 lb 12.8 oz (110.6 kg)  SpO2: 99%  BMI  (Calculated): 43.2   Physical Exam Vitals and nursing note reviewed.  Constitutional:      General: She is not in acute distress.    Appearance: Normal appearance. She is obese. She is not ill-appearing.  HENT:     Head: Normocephalic and atraumatic.  Cardiovascular:     Rate and Rhythm: Normal rate and regular rhythm.     Pulses: Normal pulses.     Heart sounds: Normal heart sounds.  Pulmonary:     Effort: Pulmonary effort is normal. No respiratory distress.     Breath sounds: Normal breath sounds. No wheezing, rhonchi or rales.  Musculoskeletal:     Left knee: Swelling present.  Skin:    General: Skin is warm and dry.  Neurological:     Mental Status: She is alert and oriented to person, place, and time.  Psychiatric:        Attention and Perception: Attention normal.        Mood and Affect: Mood is anxious and depressed.        Speech: Speech normal.        Behavior: Behavior normal. Behavior is cooperative.        Thought Content: Thought content normal.        Cognition and Memory: Cognition normal.        Judgment: Judgment normal.   No results found for this or any previous visit (from the past  24 hour(s)).     The 10-year ASCVD risk score (Arnett DK, et al., 2019) is: 2.8%   Values used to calculate the score:     Age: 64 years     Sex: Female     Is Non-Hispanic African American: Yes     Diabetic: No     Tobacco smoker: No     Systolic Blood Pressure: 127 mmHg     Is BP treated: Yes     HDL Cholesterol: 81 mg/dL     Total Cholesterol: 233 mg/dL   Assessment & Plan:   1. Encounter for weight management Discontinue phentermine.  Restart QIHKVQ.  Since she has been off of this for several months, we are restarting at the 0.5 mg weekly dose and will move up every 4 weeks as tolerated.  Recommend regular intentional exercise, a low calorie diet, adequate protein, and portion control.  2. Need for shingles vaccine Shingrix No. 2 given in office today. -  Varicella-zoster vaccine IM (Shingrix)  3. Stress reaction causing mixed disturbance of emotion and conduct Discussed various options.  Unfortunately, this appears to be a situation that she has to simply go through as it is very situational.  She is aware of options for treatment with medications as well as counseling but prefers to hold off on this for now.  Advised her to reach out should there be anything that we can do to help get her through this difficult time.  4. Chronic pain of left knee After review of imaging results, I would absolutely recommend following up with Dr. Karie Schwalbe since physical therapy has not made much of a difference.  Return in about 3 months (around 05/10/2022) for weight check.  ___________________________________________ Thayer Ohm, DNP, APRN, FNP-BC Primary Care and Sports Medicine [facility]

## 2022-02-07 ENCOUNTER — Encounter: Payer: Self-pay | Admitting: Medical-Surgical

## 2022-02-07 ENCOUNTER — Telehealth (INDEPENDENT_AMBULATORY_CARE_PROVIDER_SITE_OTHER): Payer: No Typology Code available for payment source | Admitting: Medical-Surgical

## 2022-02-07 ENCOUNTER — Other Ambulatory Visit (HOSPITAL_COMMUNITY): Payer: Self-pay

## 2022-02-07 VITALS — BP 127/80 | HR 82 | Resp 20 | Ht 63.0 in | Wt 243.8 lb

## 2022-02-07 DIAGNOSIS — M25562 Pain in left knee: Secondary | ICD-10-CM

## 2022-02-07 DIAGNOSIS — G8929 Other chronic pain: Secondary | ICD-10-CM | POA: Diagnosis not present

## 2022-02-07 DIAGNOSIS — Z7689 Persons encountering health services in other specified circumstances: Secondary | ICD-10-CM

## 2022-02-07 DIAGNOSIS — F430 Acute stress reaction: Secondary | ICD-10-CM | POA: Diagnosis not present

## 2022-02-07 DIAGNOSIS — Z23 Encounter for immunization: Secondary | ICD-10-CM | POA: Diagnosis not present

## 2022-02-07 MED ORDER — WEGOVY 0.5 MG/0.5ML ~~LOC~~ SOAJ
0.5000 mg | SUBCUTANEOUS | 0 refills | Status: DC
  Filled 2022-02-07: qty 2, 28d supply, fill #0

## 2022-02-07 MED ORDER — FAMOTIDINE 40 MG PO TABS
40.0000 mg | ORAL_TABLET | Freq: Every day | ORAL | 1 refills | Status: AC
  Filled 2022-02-07: qty 90, 90d supply, fill #0
  Filled 2022-08-09: qty 90, 90d supply, fill #1

## 2022-02-08 ENCOUNTER — Other Ambulatory Visit (HOSPITAL_COMMUNITY): Payer: Self-pay

## 2022-02-16 ENCOUNTER — Other Ambulatory Visit (HOSPITAL_COMMUNITY): Payer: Self-pay

## 2022-02-19 ENCOUNTER — Other Ambulatory Visit: Payer: Self-pay | Admitting: Medical-Surgical

## 2022-02-19 ENCOUNTER — Encounter: Payer: Self-pay | Admitting: Medical-Surgical

## 2022-02-19 DIAGNOSIS — I10 Essential (primary) hypertension: Secondary | ICD-10-CM

## 2022-02-20 ENCOUNTER — Ambulatory Visit (INDEPENDENT_AMBULATORY_CARE_PROVIDER_SITE_OTHER): Payer: No Typology Code available for payment source

## 2022-02-20 ENCOUNTER — Other Ambulatory Visit (HOSPITAL_COMMUNITY): Payer: Self-pay

## 2022-02-20 ENCOUNTER — Ambulatory Visit (INDEPENDENT_AMBULATORY_CARE_PROVIDER_SITE_OTHER): Payer: No Typology Code available for payment source | Admitting: Sports Medicine

## 2022-02-20 DIAGNOSIS — M1712 Unilateral primary osteoarthritis, left knee: Secondary | ICD-10-CM | POA: Diagnosis not present

## 2022-02-20 DIAGNOSIS — M170 Bilateral primary osteoarthritis of knee: Secondary | ICD-10-CM | POA: Insufficient documentation

## 2022-02-20 MED ORDER — LOSARTAN POTASSIUM-HCTZ 100-25 MG PO TABS
ORAL_TABLET | ORAL | 0 refills | Status: DC
  Filled 2022-02-20: qty 90, 90d supply, fill #0

## 2022-02-20 MED ORDER — METOPROLOL SUCCINATE ER 25 MG PO TB24
25.0000 mg | ORAL_TABLET | Freq: Every day | ORAL | 0 refills | Status: DC
  Filled 2022-02-20: qty 90, 90d supply, fill #0

## 2022-02-20 NOTE — Progress Notes (Signed)
    Procedures performed today:    Procedure: Real-time Ultrasound Guided injection of the left knee Device: Samsung HS60  Verbal informed consent obtained.  Time-out conducted.  Noted no overlying erythema, induration, or other signs of local infection.  Skin prepped in a sterile fashion.  Local anesthesia: Topical Ethyl chloride.  With sterile technique and under real time ultrasound guidance: Noted trace effusion, 1 cc Kenalog 40, 2 cc lidocaine, 2 cc bupivacaine injected easily Completed without difficulty  Advised to call if fevers/chills, erythema, induration, drainage, or persistent bleeding.  Images permanently stored and available for review in PACS.  Impression: Technically successful ultrasound guided injection.  Independent interpretation of notes and tests performed by another provider:   None.  Brief History, Exam, Impression, and Recommendations:    Primary osteoarthritis of left knee Pleasant 51 year old female, long history of knee pain, bilateral, left worse than right mostly tibial joint line, x-rays and MRI showed osteoarthritis, she did have some degenerative meniscal changes as well, no overt mechanical symptoms. Has tried meloxicam, Tylenol, topical diclofenac all without significant improvement. Injected today, home conditioning given, return to see me in 6 weeks.  Chronic process with exacerbation and pharmacologic intervention  ____________________________________________ Ihor Austin. Benjamin Stain, M.D., ABFM., CAQSM., AME. Primary Care and Sports Medicine [facility]  Adjunct Professor of Family Medicine  [employer]  Restaurant manager, fast food

## 2022-02-20 NOTE — Telephone Encounter (Signed)
Medication discontinued. Patient to restart [facility]. Pending prior authorization.

## 2022-02-20 NOTE — Assessment & Plan Note (Signed)
Pleasant 51 year old female, long history of knee pain, bilateral, left worse than right mostly tibial joint line, x-rays and MRI showed osteoarthritis, she did have some degenerative meniscal changes as well, no overt mechanical symptoms. Has tried meloxicam, Tylenol, topical diclofenac all without significant improvement. Injected today, home conditioning given, return to see me in 6 weeks.

## 2022-02-23 ENCOUNTER — Other Ambulatory Visit (HOSPITAL_COMMUNITY): Payer: Self-pay

## 2022-02-24 ENCOUNTER — Other Ambulatory Visit (HOSPITAL_COMMUNITY): Payer: Self-pay

## 2022-02-27 ENCOUNTER — Other Ambulatory Visit (INDEPENDENT_AMBULATORY_CARE_PROVIDER_SITE_OTHER): Payer: Self-pay | Admitting: Medical-Surgical

## 2022-02-27 ENCOUNTER — Encounter: Payer: Self-pay | Admitting: Medical-Surgical

## 2022-02-27 ENCOUNTER — Other Ambulatory Visit: Payer: Self-pay | Admitting: Medical-Surgical

## 2022-02-27 ENCOUNTER — Other Ambulatory Visit (HOSPITAL_COMMUNITY): Payer: Self-pay

## 2022-02-27 MED ORDER — OMEPRAZOLE 40 MG PO CPDR
DELAYED_RELEASE_CAPSULE | ORAL | 1 refills | Status: DC
  Filled 2022-02-27: qty 90, 90d supply, fill #0
  Filled 2022-05-24: qty 90, 90d supply, fill #1

## 2022-02-27 MED ORDER — PHENTERMINE HCL 37.5 MG PO TABS
ORAL_TABLET | ORAL | 0 refills | Status: DC
  Filled 2022-02-27: qty 30, 30d supply, fill #0

## 2022-02-27 NOTE — Telephone Encounter (Signed)
Last Office visit 02/07/2022 video visit  Last filled 01/10/2022

## 2022-02-27 NOTE — Telephone Encounter (Signed)
Last office visit 02/07/2022  Last filled 04/22/2021

## 2022-03-02 ENCOUNTER — Telehealth: Payer: Self-pay

## 2022-03-02 NOTE — Telephone Encounter (Signed)
Initiated Prior authorization OPF:[id number] 40MG  dr capsules Via: Covermymeds Case/Key:[id number] Status: Pending as of 03/02/22 Reason: Notified Pt via: Mychart

## 2022-03-08 ENCOUNTER — Encounter (INDEPENDENT_AMBULATORY_CARE_PROVIDER_SITE_OTHER): Payer: Self-pay

## 2022-03-23 ENCOUNTER — Other Ambulatory Visit: Payer: Self-pay | Admitting: Medical-Surgical

## 2022-03-23 ENCOUNTER — Other Ambulatory Visit (HOSPITAL_COMMUNITY): Payer: Self-pay

## 2022-03-23 MED ORDER — CETIRIZINE HCL 10 MG PO TABS
10.0000 mg | ORAL_TABLET | Freq: Every day | ORAL | 0 refills | Status: DC
  Filled 2022-03-23: qty 30, 30d supply, fill #0

## 2022-03-24 ENCOUNTER — Other Ambulatory Visit (HOSPITAL_COMMUNITY): Payer: Self-pay

## 2022-04-04 ENCOUNTER — Telehealth: Payer: Self-pay | Admitting: Sports Medicine

## 2022-04-04 ENCOUNTER — Ambulatory Visit (INDEPENDENT_AMBULATORY_CARE_PROVIDER_SITE_OTHER): Payer: No Typology Code available for payment source | Admitting: Sports Medicine

## 2022-04-04 ENCOUNTER — Ambulatory Visit (INDEPENDENT_AMBULATORY_CARE_PROVIDER_SITE_OTHER): Payer: No Typology Code available for payment source

## 2022-04-04 DIAGNOSIS — M1712 Unilateral primary osteoarthritis, left knee: Secondary | ICD-10-CM | POA: Diagnosis not present

## 2022-04-04 DIAGNOSIS — M170 Bilateral primary osteoarthritis of knee: Secondary | ICD-10-CM | POA: Diagnosis not present

## 2022-04-04 NOTE — Telephone Encounter (Signed)
Bilateral Visco approval please, bilateral x-ray confirmed osteoarthritis, failed steroid injections, NSAIDs, acetaminophen, greater than 6 weeks of home therapy.

## 2022-04-04 NOTE — Progress Notes (Signed)
    Procedures performed today:    Procedure: Real-time Ultrasound Guided injection of the right knee Device: Samsung HS60  Verbal informed consent obtained.  Time-out conducted.  Noted no overlying erythema, induration, or other signs of local infection.  Skin prepped in a sterile fashion.  Local anesthesia: Topical Ethyl chloride.  With sterile technique and under real time ultrasound guidance: Noted trace effusion, 1 cc Kenalog 40, 2 cc lidocaine, 2 cc bupivacaine injected easily Completed without difficulty  Advised to call if fevers/chills, erythema, induration, drainage, or persistent bleeding.  Images permanently stored and available for review in PACS.  Impression: Technically successful ultrasound guided injection.  Independent interpretation of notes and tests performed by another provider:   None.  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 She had good relief for 3 weeks and then now partial relief. Right knee is hurting as well so we injected her right knee today, I am also going to get her approved for bilateral viscosupplementation. Return to start Visco when approved.    ____________________________________________ Ihor Austin. Benjamin Stain, M.D., ABFM., CAQSM., AME. Primary Care and Sports Medicine [facility]  Adjunct Professor of Family Medicine  [employer] of [employer]  Restaurant manager, fast food

## 2022-04-04 NOTE — Assessment & Plan Note (Signed)
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 She had good relief for 3 weeks and then now partial relief. Right knee is hurting as well so we injected her right knee today, I am also going to get her approved for bilateral viscosupplementation. Return to start Visco when approved.

## 2022-04-05 NOTE — Telephone Encounter (Signed)
My[employer] paperwork faxed to [employer] at [phone] Request is for Orthovisc Pt's insurance prefers Orthovisc Fax confirmation receipt received  

## 2022-04-13 ENCOUNTER — Other Ambulatory Visit (HOSPITAL_COMMUNITY): Payer: Self-pay

## 2022-04-16 ENCOUNTER — Other Ambulatory Visit: Payer: Self-pay | Admitting: Medical-Surgical

## 2022-04-17 MED ORDER — CETIRIZINE HCL 10 MG PO TABS
10.0000 mg | ORAL_TABLET | Freq: Every day | ORAL | 1 refills | Status: DC
  Filled 2022-04-17: qty 90, 90d supply, fill #0
  Filled 2022-08-09: qty 90, 90d supply, fill #1

## 2022-04-17 NOTE — Telephone Encounter (Signed)
Phentermine sent for the final month in July. No further refills.

## 2022-04-17 NOTE — Telephone Encounter (Signed)
Last video visit 02/07/2022  Last filled 02/27/2022

## 2022-04-18 ENCOUNTER — Other Ambulatory Visit (HOSPITAL_COMMUNITY): Payer: Self-pay

## 2022-04-19 ENCOUNTER — Other Ambulatory Visit (HOSPITAL_COMMUNITY): Payer: Self-pay

## 2022-04-20 NOTE — Telephone Encounter (Signed)
PA required. Clinical info faxed, confirmation rec'd.

## 2022-04-27 NOTE — Telephone Encounter (Signed)
Called [facility], they stated that if the cost was under $1500 per injection there was no PA needed. Patient does have a $50 copay. Patient is aware of her approx cost and wishes to proceed with the injections. Call was transferred to the front desk for scheduling.

## 2022-05-02 ENCOUNTER — Ambulatory Visit (INDEPENDENT_AMBULATORY_CARE_PROVIDER_SITE_OTHER): Payer: No Typology Code available for payment source

## 2022-05-02 ENCOUNTER — Encounter: Payer: Self-pay | Admitting: Sports Medicine

## 2022-05-02 ENCOUNTER — Ambulatory Visit (INDEPENDENT_AMBULATORY_CARE_PROVIDER_SITE_OTHER): Payer: No Typology Code available for payment source | Admitting: Sports Medicine

## 2022-05-02 VITALS — Ht 62.0 in | Wt 248.0 lb

## 2022-05-02 DIAGNOSIS — M79642 Pain in left hand: Secondary | ICD-10-CM

## 2022-05-02 DIAGNOSIS — M170 Bilateral primary osteoarthritis of knee: Secondary | ICD-10-CM

## 2022-05-02 DIAGNOSIS — M79641 Pain in right hand: Secondary | ICD-10-CM

## 2022-05-02 NOTE — Assessment & Plan Note (Signed)
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 Bilateral knee osteoarthritis, did not do significantly well enough with steroid injections, Visco approved so we started Orthovisc today. Return in 1 week for Orthovisc No. 2 of 4 both knees.

## 2022-05-02 NOTE — Progress Notes (Signed)
    Procedures performed today:    Procedure: Real-time Ultrasound Guided injection of the left knee Device: Samsung HS60  Verbal informed consent obtained.  Time-out conducted.  Noted no overlying erythema, induration, or other signs of local infection.  Skin prepped in a sterile fashion.  Local anesthesia: Topical Ethyl chloride.  With sterile technique and under real time ultrasound guidance: Noted trace effusion, 30 mg/2 mL of OrthoVisc (sodium hyaluronate) in a prefilled syringe was injected easily into the knee through a 22-gauge needle. Completed without difficulty  Advised to call if fevers/chills, erythema, induration, drainage, or persistent bleeding.  Images permanently stored and available for review in PACS.  Impression: Technically successful ultrasound guided injection.  Procedure: Real-time Ultrasound Guided injection of the right knee Device: Samsung HS60  Verbal informed consent obtained.  Time-out conducted.  Noted no overlying erythema, induration, or other signs of local infection.  Skin prepped in a sterile fashion.  Local anesthesia: Topical Ethyl chloride.  With sterile technique and under real time ultrasound guidance: Noted trace effusion, 30 mg/2 mL of OrthoVisc (sodium hyaluronate) in a prefilled syringe was injected easily into the knee through a 22-gauge needle. Completed without difficulty  Advised to call if fevers/chills, erythema, induration, drainage, or persistent bleeding.  Images permanently stored and available for review in PACS.  Impression: Technically successful ultrasound guided injection.  Independent interpretation of notes and tests performed by another provider:   None.  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 Bilateral knee osteoarthritis, did not do significantly well enough with steroid injections, [facility]  approved so we started Orthovisc today. Return in 1 week for Orthovisc No. 2 of 4 both knees.  Bilateral hand pain Also complaining of pain both hands, third PIPs bilaterally, there is some swelling fusiform third PIP, as well as what appears to be a mild developing boutonniere deformity, no history of trauma. We will start conservatively, ibuprofen 800 mg 3 times daily for 2 weeks, we will get some imaging if not better after that.    ____________________________________________ Gwen Her. Dianah Field, M.D., ABFM., CAQSM., AME. Primary Care and Sports Medicine  [facility]  Adjunct Professor of [place] of [facility] of Medicine  Risk manager

## 2022-05-02 NOTE — Assessment & Plan Note (Signed)
Also complaining of pain both hands, third PIPs bilaterally, there is some swelling fusiform third PIP, as well as what appears to be a mild developing boutonniere deformity, no history of trauma. We will start conservatively, ibuprofen 800 mg 3 times daily for 2 weeks, we will get some imaging if not better after that.

## 2022-05-08 ENCOUNTER — Ambulatory Visit (INDEPENDENT_AMBULATORY_CARE_PROVIDER_SITE_OTHER): Payer: No Typology Code available for payment source

## 2022-05-08 ENCOUNTER — Ambulatory Visit: Payer: No Typology Code available for payment source | Admitting: Sports Medicine

## 2022-05-08 DIAGNOSIS — M79641 Pain in right hand: Secondary | ICD-10-CM

## 2022-05-08 DIAGNOSIS — M79642 Pain in left hand: Secondary | ICD-10-CM | POA: Diagnosis not present

## 2022-05-08 DIAGNOSIS — M170 Bilateral primary osteoarthritis of knee: Secondary | ICD-10-CM

## 2022-05-08 NOTE — Progress Notes (Signed)
    Procedures performed today:    Procedure: Real-time Ultrasound Guided injection of the left knee Device: Samsung HS60  Verbal informed consent obtained.  Time-out conducted.  Noted no overlying erythema, induration, or other signs of local infection.  Skin prepped in a sterile fashion.  Local anesthesia: Topical Ethyl chloride.  With sterile technique and under real time ultrasound guidance: Noted trace effusion, 30 mg/2 mL of OrthoVisc (sodium hyaluronate) in a prefilled syringe was injected easily into the knee through a 22-gauge needle. Completed without difficulty  Advised to call if fevers/chills, erythema, induration, drainage, or persistent bleeding.  Images permanently stored and available for review in PACS.  Impression: Technically successful ultrasound guided injection.   Procedure: Real-time Ultrasound Guided injection of the right knee Device: Samsung HS60  Verbal informed consent obtained.  Time-out conducted.  Noted no overlying erythema, induration, or other signs of local infection.  Skin prepped in a sterile fashion.  Local anesthesia: Topical Ethyl chloride.  With sterile technique and under real time ultrasound guidance: Noted trace effusion, 30 mg/2 mL of OrthoVisc (sodium hyaluronate) in a prefilled syringe was injected easily into the knee through a 22-gauge needle. Completed without difficulty  Advised to call if fevers/chills, erythema, induration, drainage, or persistent bleeding.  Images permanently stored and available for review in PACS.  Impression: Technically successful ultrasound guided injection.  Independent interpretation of notes and tests performed by another provider:   None.  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 2 of 4 both knees, return in 1 week for 3 of  4.  Bilateral hand pain Continues to have bilateral pain at third PIPs albeit better with ibuprofen, we will get some x-rays today, we will do this for 4 weeks before considering injection.    ____________________________________________ Gwen Her. Dianah Field, M.D., ABFM., CAQSM., AME. Primary Care and Sports Medicine [place] [facility]  Adjunct Professor of [employer] of [facility] of Medicine  Risk manager

## 2022-05-08 NOTE — Assessment & Plan Note (Addendum)
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 2 of 4 both knees, return in 1 week for 3 of 4.

## 2022-05-08 NOTE — Assessment & Plan Note (Signed)
Continues to have bilateral pain at third PIPs albeit better with ibuprofen, we will get some x-rays today, we will do this for 4 weeks before considering injection.

## 2022-05-11 ENCOUNTER — Ambulatory Visit (INDEPENDENT_AMBULATORY_CARE_PROVIDER_SITE_OTHER): Payer: No Typology Code available for payment source | Admitting: Medical-Surgical

## 2022-05-11 ENCOUNTER — Encounter: Payer: Self-pay | Admitting: Medical-Surgical

## 2022-05-11 ENCOUNTER — Ambulatory Visit (INDEPENDENT_AMBULATORY_CARE_PROVIDER_SITE_OTHER): Payer: No Typology Code available for payment source

## 2022-05-11 VITALS — BP 147/91 | HR 66 | Resp 20 | Ht 62.0 in | Wt 253.5 lb

## 2022-05-11 DIAGNOSIS — I10 Essential (primary) hypertension: Secondary | ICD-10-CM

## 2022-05-11 DIAGNOSIS — Z7689 Persons encountering health services in other specified circumstances: Secondary | ICD-10-CM

## 2022-05-11 DIAGNOSIS — Z23 Encounter for immunization: Secondary | ICD-10-CM | POA: Diagnosis not present

## 2022-05-11 DIAGNOSIS — M79642 Pain in left hand: Secondary | ICD-10-CM | POA: Diagnosis not present

## 2022-05-11 DIAGNOSIS — R7303 Prediabetes: Secondary | ICD-10-CM

## 2022-05-11 DIAGNOSIS — Z710 Person encountering health services to consult on behalf of another person: Secondary | ICD-10-CM | POA: Insufficient documentation

## 2022-05-11 DIAGNOSIS — M79641 Pain in right hand: Secondary | ICD-10-CM | POA: Diagnosis not present

## 2022-05-11 MED ORDER — PHENTERMINE-TOPIRAMATE ER 3.75-23 MG PO CP24: 1.0000 | ORAL_CAPSULE | Freq: Every morning | ORAL | 0 refills | Status: DC

## 2022-05-11 NOTE — Progress Notes (Signed)
Established Patient Office Visit  Subjective   Patient ID: Amy Diaz, female   DOB: June 28, 1971 Age: 51 y.o. MRN: [id number]   Chief Complaint  Patient presents with   Weight Check   HPI Pleasant 51 year old female presenting today for discussion of weight loss options.  Previously took phentermine with only minimal results.  Has also tried [place] with poor results of weight loss.  Was referred to medical weight management and attended several appointments however this was cost prohibitive as well as unhelpful for weight loss.  Notes that she is very interested in being put on Qsymia as she has heard from friends that it is a great medicine to help with weight loss.  She has been traveling back and forth to the [employer] very frequently due to family issues with her mother's dementia and her sister having a medical emergency.  Also notes that there are times when she does not eat or drink much of anything throughout the day.  She does not feel hungry and only remembers when someone asks or when she stops in the evening and thinks about it.  Not exercising.  Notes that she is taking losartan-hydrochlorothiazide as prescribed.  Not sure why her blood pressure is elevated today.  Feels that she is gaining fluid and finds that her lower extremities swell quite a bit at the end of the day.  She does have compression socks at home but does not wear them.  Tries to limit her sodium intake.  Objective:    Vitals:   05/11/22 1106  BP: (!) 147/91  Pulse: 66  Resp: 20  Height: 5\' 2"  (1.575 m)  Weight: 253 lb 8 oz (115 kg)  SpO2: 98%  BMI (Calculated): 46.35    Physical Exam Vitals and nursing note reviewed.  Constitutional:      General: She is not in acute distress.    Appearance: Normal appearance. She is obese. She is not ill-appearing.  HENT:     Head: Normocephalic and atraumatic.  Cardiovascular:     Rate and Rhythm: Normal rate and regular rhythm.     Pulses: Normal pulses.      Heart sounds: Normal heart sounds.  Pulmonary:     Effort: Pulmonary effort is normal. No respiratory distress.     Breath sounds: Normal breath sounds. No wheezing, rhonchi or rales.  Skin:    General: Skin is warm and dry.  Neurological:     Mental Status: She is alert and oriented to person, place, and time.  Psychiatric:        Mood and Affect: Mood normal.        Behavior: Behavior normal.        Thought Content: Thought content normal.        Judgment: Judgment normal.   No results found for this or any previous visit (from the past 24 hour(s)).     The 10-year ASCVD risk score (Arnett DK, et al., 2019) is: 4.9%   Values used to calculate the score:     Age: 51 years     Sex: Female     Is Non-Hispanic African American: Yes     Diabetic: No     Tobacco smoker: No     Systolic Blood Pressure: 147 mmHg     Is BP treated: Yes     HDL Cholesterol: 81 mg/dL     Total Cholesterol: 233 mg/dL   Assessment & Plan:   1. Encounter for weight management Discussed  various options.  Patient is quite aware that her lifestyle and dietary practices need to be adjusted.  Tips and tricks on weight loss provided with her AVS and advised her to work on regular dietary intake of no less than 1200 cal/day.  She will need to start some exercise at least 3 times weekly that includes both cardiovascular activities and strength training.  Although it does not seem that appetite is a problem, we will go ahead and start the low-dose of Qsymia if covered by insurance.  2. Need for influenza vaccination Flu vaccine given in office today. - Flu Vaccine QUAD 6+ mos PF IM (Fluarix Quad PF)  3. Prediabetes Checking hemoglobin A1c. - Hemoglobin A1c  4. Essential hypertension Checking labs as below.  Blood pressure elevated on arrival, missed recheck prior to her leaving.  Recommend continuing losartan-hydrochlorothiazide as prescribed.  Monitor blood pressure at home with a goal of 130/80 or less and  if consistently higher than this, she will need medication adjustment.  Low-sodium diet recommended.  If kidney function looks okay, we will consider adding a low-dose Lasix up to 3 times weekly as needed.  Strongly recommend compression socks/stockings to help with swelling. - CBC with Differential/Platelet - COMPLETE METABOLIC PANEL WITH GFR - Lipid panel  Return in about 6 weeks (around 06/22/2022) for weight check.  ___________________________________________ Clearnce Sorrel, DNP, APRN, FNP-BC Primary Care and [place]

## 2022-05-11 NOTE — Patient Instructions (Signed)
Weight loss tips and tricks:  1.  Make sure to drink at least 64 ounces of water every day. 2.  Avoid alcohol. 3.  Avoid eating within 3 hours of going to bed. 4.  Cut out sugary drinks such as sweet tea, regular sodas, energy drinks, etc. 5.  Make sure you are getting enough sleep every night. 6.  Start making changes by cutting back portion sizes by 1/3. 7.  Keep a food diary to help identify areas for improvement and promote awareness of bad habits. 8.  Increase your activity.  Choose something you like to do that is fun for you so you are more likely to stick with it. 9.  Do not forget your protein! 10.  Measure your neck, upper arms, waist, hips, and thighs and write those measurements down somewhere before you start your weight loss journey.  Changes in your measurements will tell a far more accurate story than the number on a scale. 11.  As you go, pay attention to how your clothes fit! 12.  Weigh yourself as often or as little as you need to.  Some folks do better weighing every day while others do better with once a week. 13.  Do not get discouraged!  Weight loss efforts are meant to be lifestyle changes.  Once you stop a medication (if you are taking 1), your behaviors and habits will determine if you maintain your weight loss or not.  For those on medications:  1.  Take your medication as prescribed every day first thing in the morning. 2.  Common side effects include nausea and constipation.  To combat this, increased your daily water consumption.  Consider adding in a stool softener (available OTC) if needed.  Also increase fiber intake with vegetables and fruits. 3.  If you have any side effects or concerns while taking medication, please do not hesitate to reach out to us here at the office. 4.  While on weight loss medications, we do require you to follow-up every 4 weeks with an in office visit.  Refills will not be called in early on controlled substances.  Good luck on your  weight loss journey!  Have faith in your self and you will reach your goals!  

## 2022-05-12 ENCOUNTER — Encounter: Payer: Self-pay | Admitting: Medical-Surgical

## 2022-05-12 LAB — CBC WITH DIFFERENTIAL/PLATELET
Absolute Monocytes: 502 cells/uL (ref 200–950)
Basophils Absolute: 59 cells/uL (ref 0–200)
Basophils Relative: 0.9 %
Eosinophils Absolute: 310 cells/uL (ref 15–500)
Eosinophils Relative: 4.7 %
HCT: 36.1 % (ref 35.0–45.0)
Hemoglobin: 12.4 g/dL (ref 11.7–15.5)
Lymphs Abs: 3247 cells/uL (ref 850–3900)
MCH: 30.3 pg (ref 27.0–33.0)
MCHC: 34.3 g/dL (ref 32.0–36.0)
MCV: 88.3 fL (ref 80.0–100.0)
MPV: 10.1 fL (ref 7.5–12.5)
Monocytes Relative: 7.6 %
Neutro Abs: 2482 cells/uL (ref 1500–7800)
Neutrophils Relative %: 37.6 %
Platelets: 395 10*3/uL (ref 140–400)
RBC: 4.09 10*6/uL (ref 3.80–5.10)
RDW: 13.1 % (ref 11.0–15.0)
Total Lymphocyte: 49.2 %
WBC: 6.6 10*3/uL (ref 3.8–10.8)

## 2022-05-12 LAB — COMPLETE METABOLIC PANEL WITH GFR
AG Ratio: 1.5 (calc) (ref 1.0–2.5)
ALT: 21 U/L (ref 6–29)
AST: 19 U/L (ref 10–35)
Albumin: 4 g/dL (ref 3.6–5.1)
Alkaline phosphatase (APISO): 58 U/L (ref 37–153)
BUN/Creatinine Ratio: 14 (calc) (ref 6–22)
BUN: 16 mg/dL (ref 7–25)
CO2: 28 mmol/L (ref 20–32)
Calcium: 9.7 mg/dL (ref 8.6–10.4)
Chloride: 104 mmol/L (ref 98–110)
Creat: 1.14 mg/dL — ABNORMAL HIGH (ref 0.50–1.03)
Globulin: 2.6 g/dL (calc) (ref 1.9–3.7)
Glucose, Bld: 94 mg/dL (ref 65–99)
Potassium: 4.1 mmol/L (ref 3.5–5.3)
Sodium: 141 mmol/L (ref 135–146)
Total Bilirubin: 0.3 mg/dL (ref 0.2–1.2)
Total Protein: 6.6 g/dL (ref 6.1–8.1)
eGFR: 58 mL/min/{1.73_m2} — ABNORMAL LOW (ref >=60)

## 2022-05-12 LAB — LIPID PANEL
Cholesterol: 265 mg/dL — ABNORMAL HIGH (ref <200)
HDL: 75 mg/dL (ref >=50)
LDL Cholesterol (Calc): 168 mg/dL (calc) — ABNORMAL HIGH
Non-HDL Cholesterol (Calc): 190 mg/dL (calc) — ABNORMAL HIGH (ref <130)
Total CHOL/HDL Ratio: 3.5 (calc) (ref <5.0)
Triglycerides: 99 mg/dL (ref <150)

## 2022-05-12 LAB — HEMOGLOBIN A1C
Hgb A1c MFr Bld: 6.2 % of total Hgb — ABNORMAL HIGH (ref <5.7)
Mean Plasma Glucose: 131 mg/dL
eAG (mmol/L): 7.3 mmol/L

## 2022-05-15 ENCOUNTER — Other Ambulatory Visit (HOSPITAL_COMMUNITY): Payer: Self-pay

## 2022-05-15 MED ORDER — FUROSEMIDE 20 MG PO TABS
20.0000 mg | ORAL_TABLET | Freq: Every day | ORAL | 1 refills | Status: DC | PRN
  Filled 2022-05-15: qty 30, 30d supply, fill #0

## 2022-05-15 MED ORDER — ATORVASTATIN CALCIUM 10 MG PO TABS
10.0000 mg | ORAL_TABLET | Freq: Every day | ORAL | 3 refills | Status: DC
  Filled 2022-05-15: qty 90, 90d supply, fill #0
  Filled 2022-08-09: qty 90, 90d supply, fill #1
  Filled 2023-01-03: qty 90, 90d supply, fill #2
  Filled 2023-04-07: qty 90, 90d supply, fill #3

## 2022-05-16 ENCOUNTER — Ambulatory Visit (INDEPENDENT_AMBULATORY_CARE_PROVIDER_SITE_OTHER): Payer: No Typology Code available for payment source

## 2022-05-16 ENCOUNTER — Other Ambulatory Visit (HOSPITAL_COMMUNITY): Payer: Self-pay

## 2022-05-16 ENCOUNTER — Ambulatory Visit: Payer: No Typology Code available for payment source | Admitting: Sports Medicine

## 2022-05-16 DIAGNOSIS — M170 Bilateral primary osteoarthritis of knee: Secondary | ICD-10-CM

## 2022-05-16 MED ORDER — SEMAGLUTIDE (2 MG/DOSE) 8 MG/3ML ~~LOC~~ SOPN: PEN_INJECTOR | SUBCUTANEOUS | 3 refills | Status: DC

## 2022-05-16 MED ORDER — WEGOVY 1.7 MG/0.75ML ~~LOC~~ SOAJ: 1.7000 mg | SUBCUTANEOUS | 0 refills | Status: DC

## 2022-05-16 MED ORDER — WEGOVY 2.4 MG/0.75ML ~~LOC~~ SOAJ: 2.4000 mg | SUBCUTANEOUS | 11 refills | Status: DC

## 2022-05-16 NOTE — Assessment & Plan Note (Signed)
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

## 2022-05-16 NOTE — Assessment & Plan Note (Signed)
It sounds like she was able to get [facility] approved but was unable to find the lower doses. I will go ahead and call in compounded semaglutide to [facility], she can use this for the first 3 months and then pick up the 1.7 and 2.4 mg doses of brand name Wegovy.

## 2022-05-16 NOTE — Progress Notes (Signed)
    Procedures performed today:    Procedure: Real-time Ultrasound Guided injection of the left knee Device: Samsung HS60  Verbal informed consent obtained.  Time-out conducted.  Noted no overlying erythema, induration, or other signs of local infection.  Skin prepped in a sterile fashion.  Local anesthesia: Topical Ethyl chloride.  With sterile technique and under real time ultrasound guidance: Noted trace effusion, 30 mg/2 mL of OrthoVisc (sodium hyaluronate) in a prefilled syringe was injected easily into the knee through a 22-gauge needle. Completed without difficulty  Advised to call if fevers/chills, erythema, induration, drainage, or persistent bleeding.  Images permanently stored and available for review in PACS.  Impression: Technically successful ultrasound guided injection.   Procedure: Real-time Ultrasound Guided injection of the right knee Device: Samsung HS60  Verbal informed consent obtained.  Time-out conducted.  Noted no overlying erythema, induration, or other signs of local infection.  Skin prepped in a sterile fashion.  Local anesthesia: Topical Ethyl chloride.  With sterile technique and under real time ultrasound guidance: Noted trace effusion, 30 mg/2 mL of OrthoVisc (sodium hyaluronate) in a prefilled syringe was injected easily into the knee through a 22-gauge needle. Completed without difficulty  Advised to call if fevers/chills, erythema, induration, drainage, or persistent bleeding.  Images permanently stored and available for review in PACS.  Impression: Technically successful ultrasound guided injection.  Independent interpretation of notes and tests performed by another provider:   None.  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  Morbid obesity  ([place]) It sounds like she was able to get [facility] approved but was unable to find the lower doses. I will go ahead and call in compounded semaglutide to [facility], she can use this for the first 3 months and then pick up the 1.7 and 2.4 mg doses of brand name Wegovy.    ____________________________________________ Gwen Her. Dianah Field, M.D., ABFM., CAQSM., AME. Primary Care and Sports Medicine [place] [facility]  Adjunct Professor of [place] of [facility] of Medicine  Risk manager

## 2022-05-17 ENCOUNTER — Telehealth: Payer: Self-pay

## 2022-05-17 ENCOUNTER — Other Ambulatory Visit: Payer: Self-pay | Admitting: Medical-Surgical

## 2022-05-17 NOTE — Telephone Encounter (Addendum)
Initiated Prior authorization TMY:TRZNBV 3.75-23MG  er capsules Via: Covermymeds Case/Key:[id number] Status: approved  as of 05/17/22 Reason:The authorization is effective from 05/18/2022 to 06/01/2022, as long as the member is enrolled in their current health plan. The request was approved as submitted. This request has been approved for 1 capsule per day for 14 days.An additional prior authorization 9501 has been entered for Qsymia 7.5mg /46mg , this request is effective from 06/02/2022 and is valid until 08/31/2022 to 1 capsule per day.Please note that maintenance drugs must be filled at a [facility]. Notified Pt via: Mychart

## 2022-05-23 ENCOUNTER — Ambulatory Visit: Payer: No Typology Code available for payment source | Admitting: Sports Medicine

## 2022-05-23 ENCOUNTER — Encounter: Payer: Self-pay | Admitting: Sports Medicine

## 2022-05-23 ENCOUNTER — Ambulatory Visit (INDEPENDENT_AMBULATORY_CARE_PROVIDER_SITE_OTHER): Payer: No Typology Code available for payment source

## 2022-05-23 ENCOUNTER — Other Ambulatory Visit (HOSPITAL_COMMUNITY): Payer: Self-pay

## 2022-05-23 DIAGNOSIS — M170 Bilateral primary osteoarthritis of knee: Secondary | ICD-10-CM | POA: Diagnosis not present

## 2022-05-23 MED ORDER — CELECOXIB 100 MG PO CAPS
100.0000 mg | ORAL_CAPSULE | Freq: Every day | ORAL | 3 refills | Status: DC
  Filled 2022-05-23: qty 30, 30d supply, fill #0
  Filled 2022-06-17: qty 30, 30d supply, fill #1
  Filled 2022-08-09: qty 30, 30d supply, fill #2
  Filled 2022-09-11: qty 30, 30d supply, fill #3

## 2022-05-23 NOTE — Progress Notes (Signed)
    Procedures performed today:    Procedure: Real-time Ultrasound Guided injection of the left knee Device: Samsung HS60  Verbal informed consent obtained.  Time-out conducted.  Noted no overlying erythema, induration, or other signs of local infection.  Skin prepped in a sterile fashion.  Local anesthesia: Topical Ethyl chloride.  With sterile technique and under real time ultrasound guidance: No effusion noted, 30 mg/2 mL of OrthoVisc (sodium hyaluronate) in a prefilled syringe was injected easily into the knee through a 22-gauge needle. Completed without difficulty  Advised to call if fevers/chills, erythema, induration, drainage, or persistent bleeding.  Images permanently stored and available for review in PACS.  Impression: Technically successful ultrasound guided injection.   Procedure: Real-time Ultrasound Guided injection of the right knee Device: Samsung HS60  Verbal informed consent obtained.  Time-out conducted.  Noted no overlying erythema, induration, or other signs of local infection.  Skin prepped in a sterile fashion.  Local anesthesia: Topical Ethyl chloride.  With sterile technique and under real time ultrasound guidance: No effusion noted, 30 mg/2 mL of OrthoVisc (sodium hyaluronate) in a prefilled syringe was injected easily into the knee through a 22-gauge needle. Completed without difficulty  Advised to call if fevers/chills, erythema, induration, drainage, or persistent bleeding.  Images permanently stored and available for review in PACS.  Impression: Technically successful ultrasound guided injection.  Independent interpretation of notes and tests performed by another provider:   None.  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  Of note she is  almost 100% pain-free and happy with how things are going.  She does have only the mildest of renal insufficiency, we will start very low-dose Celebrex with a renal function/GFR recheck in about 2 months to see how things are going.  Morbid obesity ([place]) She was able to get [facility] approved historically but was unable to find the lower doses. At the last visit we sent in compounded semaglutide, she will use this for the first 3 months and then pick up the 1.7 and 2.4 mg doses of brand name Wegovy, further management per PCP.    ____________________________________________ Gwen Her. Dianah Field, M.D., ABFM., CAQSM., AME. Primary Care and Sports Medicine [place] [facility]  Adjunct Professor of [place] of [facility] of Medicine  Risk manager

## 2022-05-23 NOTE — Assessment & Plan Note (Addendum)
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  Of note she is almost 100% pain-free and happy with how things are going.  She does have only the mildest of renal insufficiency, we will start very low-dose Celebrex with a renal function/GFR recheck in about 2 months to see how things are going.

## 2022-05-23 NOTE — Assessment & Plan Note (Signed)
She was able to get [facility] approved historically but was unable to find the lower doses. At the last visit we sent in compounded semaglutide, she will use this for the first 3 months and then pick up the 1.7 and 2.4 mg doses of brand name Wegovy, further management per PCP.

## 2022-05-24 ENCOUNTER — Other Ambulatory Visit (HOSPITAL_COMMUNITY): Payer: Self-pay

## 2022-05-24 ENCOUNTER — Other Ambulatory Visit: Payer: Self-pay | Admitting: Medical-Surgical

## 2022-05-24 DIAGNOSIS — I10 Essential (primary) hypertension: Secondary | ICD-10-CM

## 2022-05-24 MED ORDER — METHOCARBAMOL 500 MG PO TABS
500.0000 mg | ORAL_TABLET | Freq: Three times a day (TID) | ORAL | 0 refills | Status: DC
  Filled 2022-05-24: qty 90, 30d supply, fill #0

## 2022-05-24 MED ORDER — LOSARTAN POTASSIUM-HCTZ 100-25 MG PO TABS
1.0000 | ORAL_TABLET | Freq: Every day | ORAL | 0 refills | Status: DC
  Filled 2022-05-24: qty 90, 90d supply, fill #0

## 2022-05-24 MED ORDER — ASPIRIN 81 MG PO TBEC
81.0000 mg | DELAYED_RELEASE_TABLET | Freq: Every day | ORAL | 3 refills | Status: DC
  Filled 2022-05-24: qty 90, 90d supply, fill #0
  Filled 2023-02-06: qty 90, 90d supply, fill #1

## 2022-05-24 MED ORDER — METOPROLOL SUCCINATE ER 25 MG PO TB24
25.0000 mg | ORAL_TABLET | Freq: Every day | ORAL | 0 refills | Status: DC
  Filled 2022-05-24: qty 90, 90d supply, fill #0

## 2022-05-29 IMAGING — DX DG CHEST 2V
2 series · 2 of 2 positions shown · non-contrast
Comparison: None.

CLINICAL DATA: Cough, dyspnea on exertion

EXAM:
CHEST - 2 VIEW

[chest pa]
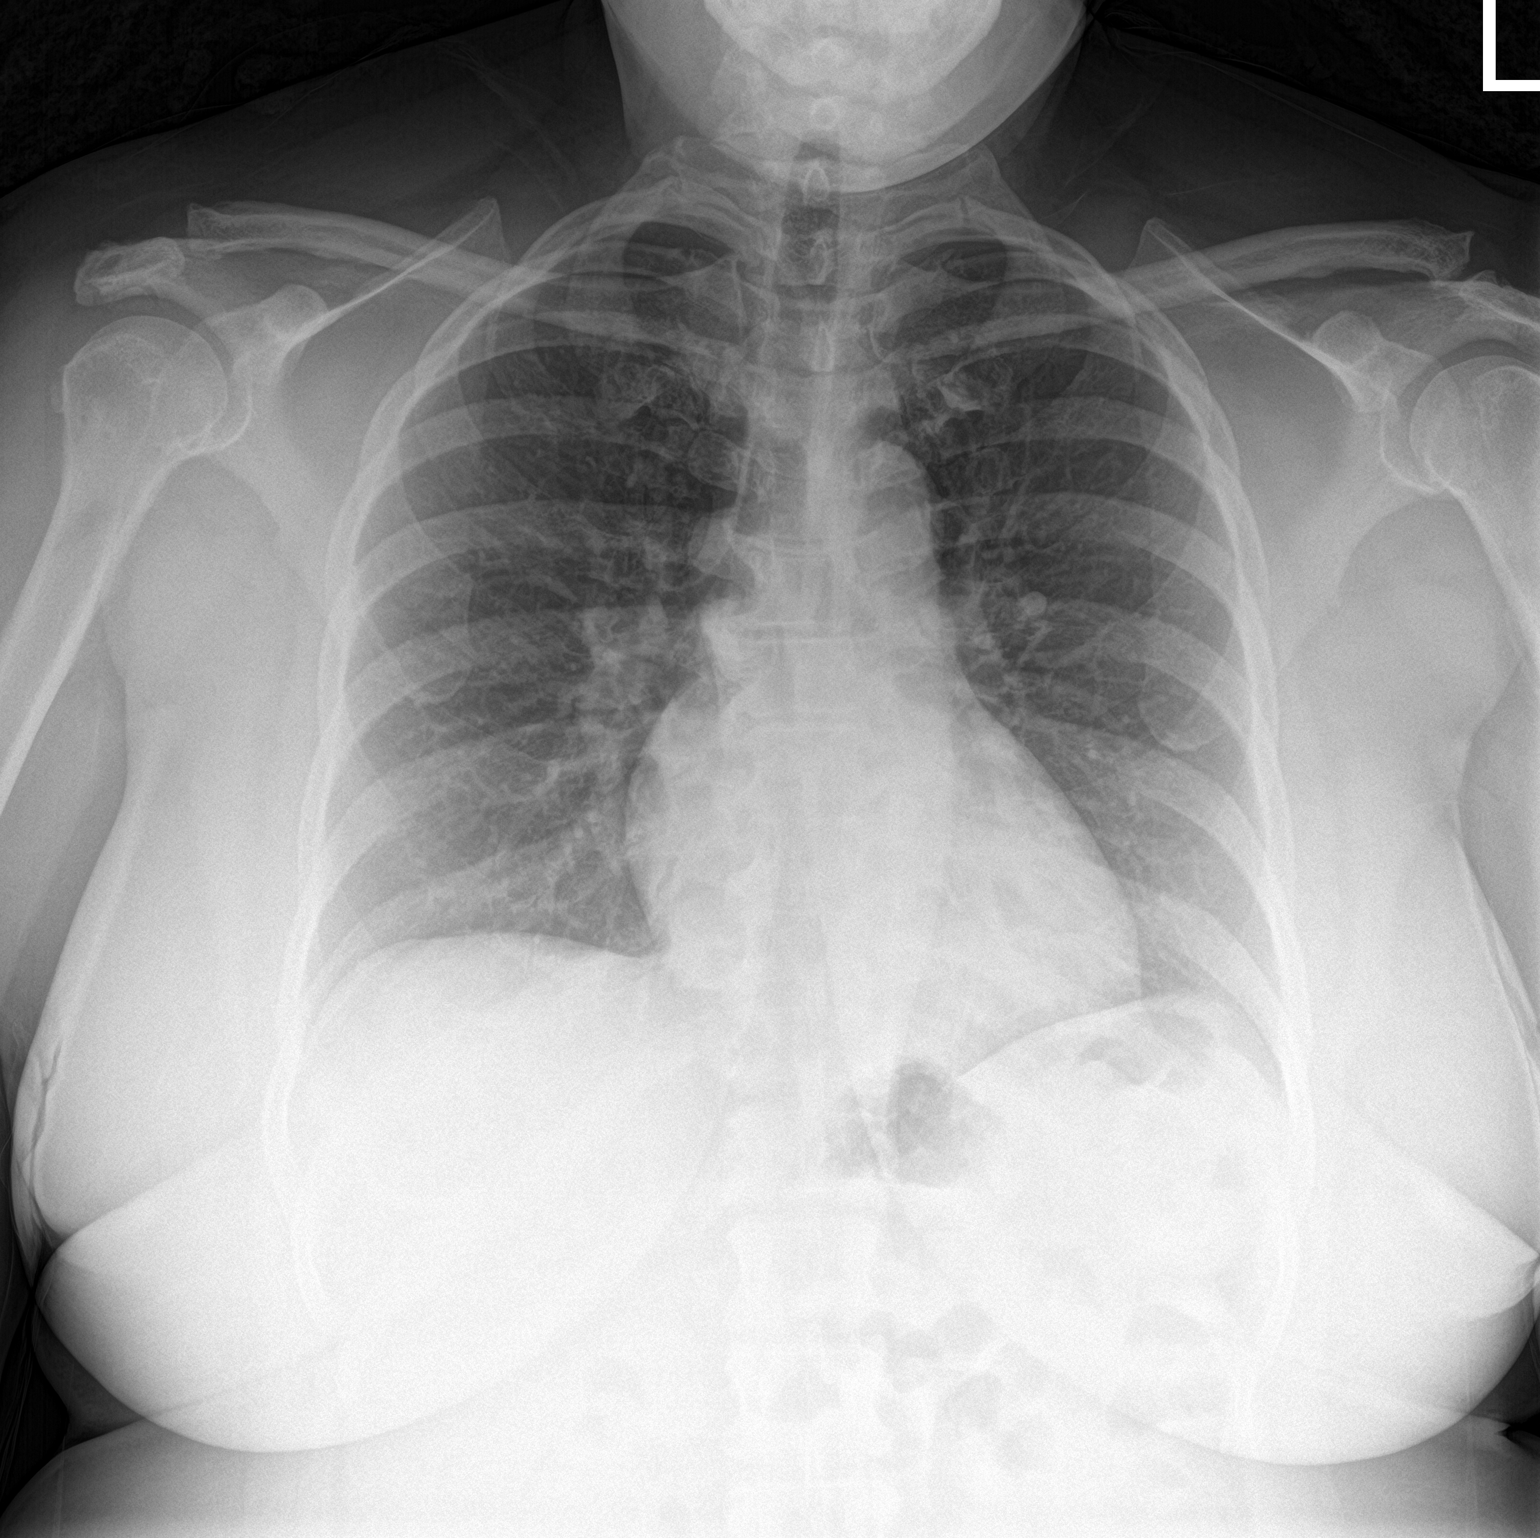

[chest lat]
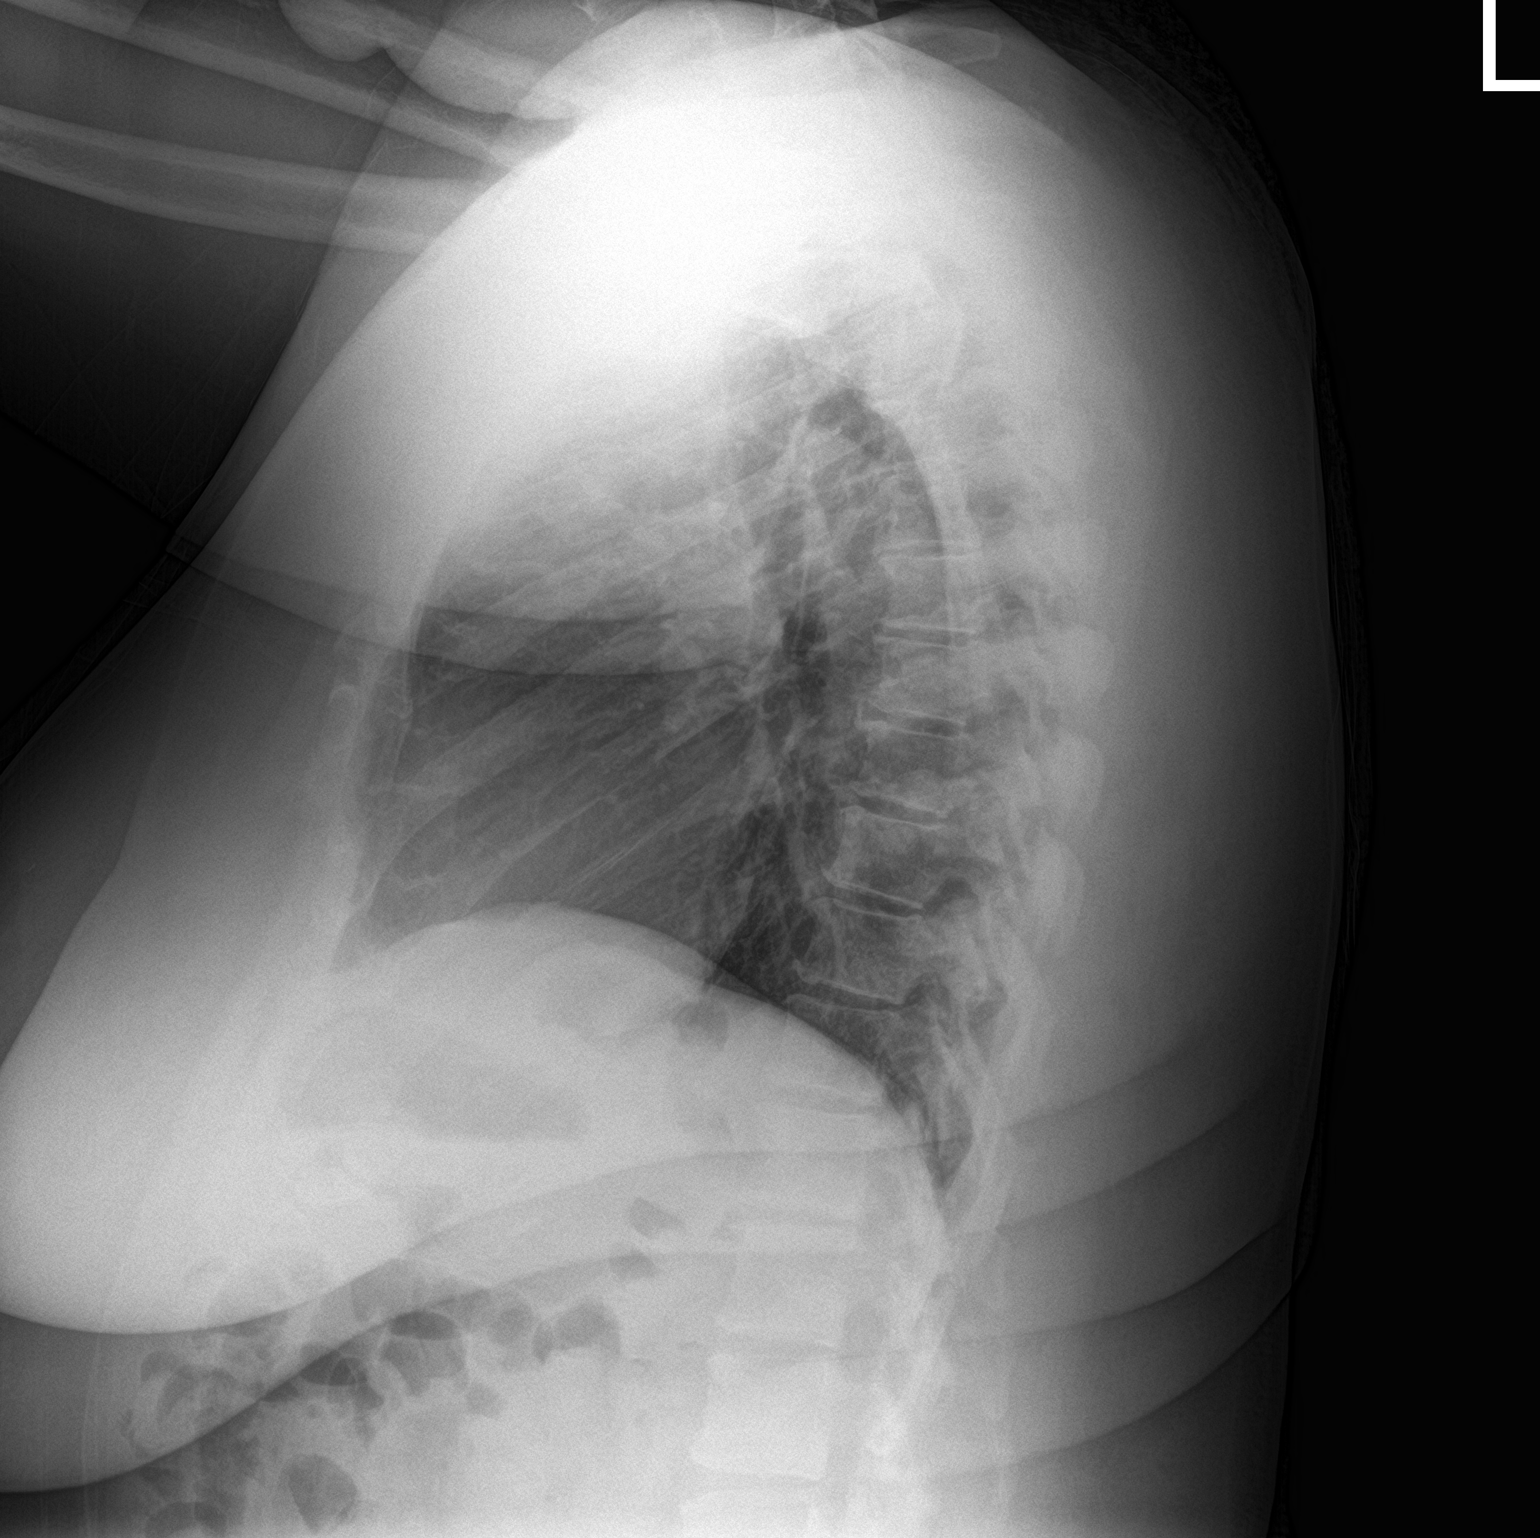

[2 of 2 positions shown; findings below may reference images not displayed]

FINDINGS: The heart size and mediastinal contours are within normal limits.
Both lungs are clear. The visualized skeletal structures are
unremarkable.
IMPRESSION: No active cardiopulmonary disease.

## 2022-06-15 ENCOUNTER — Ambulatory Visit (INDEPENDENT_AMBULATORY_CARE_PROVIDER_SITE_OTHER): Payer: No Typology Code available for payment source | Admitting: Medical-Surgical

## 2022-06-15 ENCOUNTER — Encounter: Payer: Self-pay | Admitting: Medical-Surgical

## 2022-06-15 VITALS — BP 128/83 | HR 102 | Wt 247.0 lb

## 2022-06-15 DIAGNOSIS — Z7689 Persons encountering health services in other specified circumstances: Secondary | ICD-10-CM

## 2022-06-15 DIAGNOSIS — R519 Headache, unspecified: Secondary | ICD-10-CM | POA: Diagnosis not present

## 2022-06-15 NOTE — Progress Notes (Signed)
Established Patient Office Visit  Subjective   Patient ID: Amy Diaz, female   DOB: 08/11/1970 Age: 51 y.o. MRN: [id number]   Chief Complaint  Patient presents with   Weight Check   HPI Since her last visit with me, there has been some confusion regarding her medications Pleasant 51 year old female presenting today for weight check.  She was prescribed compounded semaglutide and was able to pick this up from the pharmacy but reports that she has not started it.  She also picked up a prescription for Wegovy 2.4 mg weekly but did not get the 1.7 mg weekly dose.  At her last visit, we had discussed starting Qsymia and this was prescribed.  Unfortunately, she has not received this from the pharmacy and at her last visit there, they had no prescriptions available for her to pick up.  Reports that she has been exercising with her husband most days but only does cardiovascular activities on her spin bike.  She has not incorporated weight training yet.  She is trying to make healthier choices and notes that she has been eating salads, chicken, and fruits for the most part.  She did have salmon the other day.  She is not weighing her portions or logging her foods.  Reports that she does have a food scale at home.  Does note that she has noticed some changes in the way her clothes fit however the scale does not seem to be changing and this is very distressing.  Has been experiencing daily headachesThat mainly affects the frontal area and the left eye.  Has been taking Tylenol for these but this is only minimally helpful.   Objective:    Vitals:   06/15/22 1103  BP: 128/83  Pulse: (!) 102  Weight: 247 lb (112 kg)  SpO2: 98%    Physical Exam Vitals and nursing note reviewed.  Constitutional:      General: She is not in acute distress.    Appearance: Normal appearance. She is obese. She is not ill-appearing.  HENT:     Head: Normocephalic and atraumatic.  Cardiovascular:     Rate and Rhythm:  Normal rate and regular rhythm.     Pulses: Normal pulses.     Heart sounds: Normal heart sounds.  Pulmonary:     Effort: Pulmonary effort is normal. No respiratory distress.     Breath sounds: Normal breath sounds. No wheezing, rhonchi or rales.  Skin:    General: Skin is warm and dry.  Neurological:     Mental Status: She is alert and oriented to person, place, and time.  Psychiatric:        Mood and Affect: Mood normal.        Behavior: Behavior normal.        Thought Content: Thought content normal.        Judgment: Judgment normal.   No results found for this or any previous visit (from the past 24 hour(s)).     The 10-year ASCVD risk score (Arnett DK, et al., 2019) is: 3.6%   Values used to calculate the score:     Age: 13 years     Sex: Female     Is Non-Hispanic African American: Yes     Diabetic: No     Tobacco smoker: No     Systolic Blood Pressure: 0000000 mmHg     Is BP treated: Yes     HDL Cholesterol: 75 mg/dL     Total Cholesterol: 265 mg/dL  Assessment & Plan:   1. Encounter for weight management Previously she had used Wegovy and gotten all the way to the maintenance dose of 2.4 mg weekly without the benefit.  She has not tried Qsymia and is interested in doing so.  During her appointment we called her pharmacy and spoke to the pharmacist there.  They did not provide her the medication after it was authorized and how the dates have expired.  Plan to resubmit for prior authorization and notify both the patient and the pharmacy once this has been obtained.  Hold off on Wegovy at this time.  Strongly recommend using her food scale to wait and log portions.  Would like for her to do this over the next 2 weeks without making changes so that we can see where we are.  Strongly suspect that she is not getting enough protein in her daily intake and is overeating calories by overuse of dressings.  Advised that fruits do breakdown into sugar and although these are natural  sugars, they still can cause issues with prediabetes.  2. Chronic daily headache Significant allergy to Imitrex so would like to avoid similar medications.  Since she is planning to start Qsymia, the Topamax ingredient and it may actually benefit her headaches.  Discussed this with the patient and she is agreeable to hold off until she starts Qsymia and will let me know if she is interested in a different preventative medication at that time.  Return in about 3 months (around 09/15/2022) for weight check. ___________________________________________ Thayer Ohm, DNP, APRN, FNP-BC Primary Care and Sports Medicine [facility] [place]

## 2022-06-17 ENCOUNTER — Other Ambulatory Visit (HOSPITAL_COMMUNITY): Payer: Self-pay

## 2022-06-19 ENCOUNTER — Other Ambulatory Visit (HOSPITAL_BASED_OUTPATIENT_CLINIC_OR_DEPARTMENT_OTHER): Payer: Self-pay

## 2022-06-20 ENCOUNTER — Telehealth: Payer: Self-pay

## 2022-06-20 NOTE — Telephone Encounter (Signed)
Pt was having trouble getting her medication and was told that she need to resubmit for the Qsymia  again as it expired because the pharmacy was not contacted about the approval, called pt pharmacy  to get clarification and another pa was Initiated Prior authorization for: Qsymia 3.75-23MG  er capsules Via: Covermymeds Case/Key:[id number]  Status: approved  as of 06/20/22 Reason:The authorization is effective from 06/16/2022 to 06/30/2022, as long as the member is enrolled in their current health plan. The request was approved as submitted. This request is approved for 1 capsule per day.Please note there is an additional authorization entered for Qsymia 7.5mg -46mg  capsules with a quantity limit of 1 capsule per day effective 06/02/2022 through 08/31/2022, Notified Pt via: Mychart, pt was called left vm twice , pharamcy was callled about the approval

## 2022-06-26 ENCOUNTER — Other Ambulatory Visit (HOSPITAL_COMMUNITY): Payer: Self-pay

## 2022-06-27 ENCOUNTER — Other Ambulatory Visit (HOSPITAL_COMMUNITY): Payer: Self-pay

## 2022-06-27 MED ORDER — SEMAGLUTIDE-WEIGHT MANAGEMENT 2.4 MG/0.75ML ~~LOC~~ SOAJ
2.4000 mg | SUBCUTANEOUS | 10 refills | Status: DC
  Filled 2022-06-27: qty 3, 28d supply, fill #0
  Filled 2022-08-09: qty 3, 28d supply, fill #1
  Filled 2022-09-11 (×2): qty 3, 28d supply, fill #2

## 2022-06-28 ENCOUNTER — Other Ambulatory Visit (HOSPITAL_COMMUNITY): Payer: Self-pay

## 2022-06-29 ENCOUNTER — Other Ambulatory Visit (HOSPITAL_COMMUNITY): Payer: Self-pay

## 2022-07-20 ENCOUNTER — Other Ambulatory Visit (HOSPITAL_COMMUNITY): Payer: Self-pay

## 2022-08-09 ENCOUNTER — Other Ambulatory Visit: Payer: Self-pay | Admitting: Medical-Surgical

## 2022-08-10 ENCOUNTER — Other Ambulatory Visit (HOSPITAL_COMMUNITY): Payer: Self-pay

## 2022-08-10 ENCOUNTER — Other Ambulatory Visit: Payer: Self-pay

## 2022-08-10 MED ORDER — METHOCARBAMOL 500 MG PO TABS
500.0000 mg | ORAL_TABLET | Freq: Three times a day (TID) | ORAL | 0 refills | Status: DC
  Filled 2022-08-10: qty 90, 30d supply, fill #0

## 2022-08-11 ENCOUNTER — Other Ambulatory Visit (HOSPITAL_COMMUNITY): Payer: Self-pay

## 2022-09-11 ENCOUNTER — Other Ambulatory Visit (HOSPITAL_COMMUNITY): Payer: Self-pay

## 2022-09-11 ENCOUNTER — Encounter: Payer: Self-pay | Admitting: Medical-Surgical

## 2022-09-11 ENCOUNTER — Other Ambulatory Visit: Payer: Self-pay

## 2022-09-11 ENCOUNTER — Telehealth (INDEPENDENT_AMBULATORY_CARE_PROVIDER_SITE_OTHER): Payer: 59 | Admitting: Medical-Surgical

## 2022-09-11 DIAGNOSIS — L219 Seborrheic dermatitis, unspecified: Secondary | ICD-10-CM

## 2022-09-11 DIAGNOSIS — M170 Bilateral primary osteoarthritis of knee: Secondary | ICD-10-CM | POA: Diagnosis not present

## 2022-09-11 DIAGNOSIS — H1033 Unspecified acute conjunctivitis, bilateral: Secondary | ICD-10-CM | POA: Diagnosis not present

## 2022-09-11 MED ORDER — FLUOCINOLONE ACETONIDE BODY 0.01 % EX OIL: 1.0000 "application " | TOPICAL_OIL | Freq: Every day | CUTANEOUS | 2 refills | Status: DC

## 2022-09-11 MED ORDER — ZEPBOUND 7.5 MG/0.5ML ~~LOC~~ SOAJ
7.5000 mg | SUBCUTANEOUS | 1 refills | Status: DC
  Filled 2022-09-11 – 2022-11-27 (×4): qty 2, 28d supply, fill #0

## 2022-09-11 MED ORDER — POLYMYXIN B-TRIMETHOPRIM 10000-0.1 UNIT/ML-% OP SOLN: 1.0000 [drp] | OPHTHALMIC | 0 refills | Status: DC

## 2022-09-11 MED ORDER — CELECOXIB 100 MG PO CAPS
100.0000 mg | ORAL_CAPSULE | Freq: Every day | ORAL | 1 refills | Status: DC
  Filled 2022-09-11 – 2022-10-30 (×2): qty 90, 90d supply, fill #0
  Filled 2023-01-23: qty 90, 90d supply, fill #1

## 2022-09-11 NOTE — Progress Notes (Signed)
Virtual Visit via Video Note  I connected with Amy Diaz on 09/11/22 at  2:20 PM EST by a video enabled telemedicine application and verified that I am speaking with the correct person using two identifiers.   I discussed the limitations of evaluation and management by telemedicine and the availability of in person appointments. The patient expressed understanding and agreed to proceed.  Patient location: home Provider locations: office  Subjective:    CC: conjunctivitis  HPI: Pleasant 52 year old female presenting today via MyChart video visit for the following:   Left eye: woke up this morning with significant redness to the left sclera. Feels that the eye is very crusty and that there is a film over the eye. Notes that her right eye isn't red yet but it has started watering excessively.   Weight: has been taking Wegovy 2.42m weekly as prescribed, tolerating well without side effects. Notes she never got the Qsymia from the pharmacyback when it was sent because one pharmacy didn't have it and the other never received it. Has heard about a new medication for weight loss and is interested in trying it.   History of seborrheic dermatitis affecting her face and scalp. Used Fluocinolone oil in the past with good results. Has been using the same bottle for several years but recently had a flare of symptoms and has run out. Requesting a refill.   Past medical history, Surgical history, Family history not pertinant except as noted below, Social history, Allergies, and medications have been entered into the medical record, reviewed, and corrections made.   Review of Systems: See HPI for pertinent positives and negatives.   Objective:    General: Speaking clearly in complete sentences without any shortness of breath.  Alert and oriented x3.  Normal judgment. No apparent acute distress.  Impression and Recommendations:    1. Acute conjunctivitis of both eyes, unspecified acute  conjunctivitis type Unclear etiology. Since COVID has been associated with conjunctivitis, recommend COVID testing. Treating empirically with Polytrim eye drops 1 drop each eye four times daily x 7 days.   2. Morbid obesity ([place] Discontinue Wegovy. Start Zepbound 7.567mweekly. Strongly recommend working on dietary modification and regular intentional activity. Holding off on Qsymia.   3. Primary osteoarthritis of both knees Refilling Celebrex.  - celecoxib (CELEBREX) 100 MG capsule; Take 1 capsule (100 mg total) by mouth daily.  Dispense: 90 capsule; Refill: 1  4. Seborrheic dermatitis Refilling Fluocinolone oil as requested.   I discussed the assessment and treatment plan with the patient. The patient was provided an opportunity to ask questions and all were answered. The patient agreed with the plan and demonstrated an understanding of the instructions.   The patient was advised to call back or seek an in-person evaluation if the symptoms worsen or if the condition fails to improve as anticipated.  25 minutes of non-face-to-face time was provided during this encounter.  Return in about 6 weeks (around 10/23/2022) for weight check.  JoClearnce SorrelDNP, APRN, FNP-BC [facility]

## 2022-09-12 ENCOUNTER — Other Ambulatory Visit: Payer: Self-pay

## 2022-09-14 ENCOUNTER — Other Ambulatory Visit (HOSPITAL_COMMUNITY): Payer: Self-pay

## 2022-10-05 ENCOUNTER — Other Ambulatory Visit (HOSPITAL_COMMUNITY): Payer: Self-pay

## 2022-10-13 ENCOUNTER — Other Ambulatory Visit: Payer: Self-pay

## 2022-10-20 ENCOUNTER — Other Ambulatory Visit: Payer: Self-pay | Admitting: Sports Medicine

## 2022-10-20 ENCOUNTER — Other Ambulatory Visit (HOSPITAL_COMMUNITY): Payer: Self-pay

## 2022-10-20 ENCOUNTER — Other Ambulatory Visit: Payer: Self-pay

## 2022-10-23 ENCOUNTER — Other Ambulatory Visit: Payer: Self-pay

## 2022-10-24 ENCOUNTER — Other Ambulatory Visit (HOSPITAL_COMMUNITY): Payer: Self-pay

## 2022-10-27 ENCOUNTER — Other Ambulatory Visit (HOSPITAL_COMMUNITY): Payer: Self-pay

## 2022-10-30 ENCOUNTER — Other Ambulatory Visit (INDEPENDENT_AMBULATORY_CARE_PROVIDER_SITE_OTHER): Payer: Self-pay | Admitting: Medical-Surgical

## 2022-10-30 ENCOUNTER — Other Ambulatory Visit: Payer: Self-pay | Admitting: Medical-Surgical

## 2022-10-30 ENCOUNTER — Other Ambulatory Visit: Payer: Self-pay

## 2022-10-30 ENCOUNTER — Encounter: Payer: Self-pay | Admitting: Medical-Surgical

## 2022-10-30 ENCOUNTER — Other Ambulatory Visit (HOSPITAL_COMMUNITY): Payer: Self-pay

## 2022-10-30 DIAGNOSIS — I10 Essential (primary) hypertension: Secondary | ICD-10-CM

## 2022-10-31 ENCOUNTER — Other Ambulatory Visit: Payer: Self-pay

## 2022-10-31 ENCOUNTER — Other Ambulatory Visit (HOSPITAL_COMMUNITY): Payer: Self-pay

## 2022-10-31 MED ORDER — OMEPRAZOLE 40 MG PO CPDR
40.0000 mg | DELAYED_RELEASE_CAPSULE | Freq: Every day | ORAL | 0 refills | Status: DC
  Filled 2022-10-31: qty 90, 90d supply, fill #0

## 2022-10-31 MED ORDER — METOPROLOL SUCCINATE ER 25 MG PO TB24
25.0000 mg | ORAL_TABLET | Freq: Every day | ORAL | 0 refills | Status: DC
  Filled 2022-10-31: qty 90, 90d supply, fill #0

## 2022-11-01 ENCOUNTER — Other Ambulatory Visit: Payer: Self-pay

## 2022-11-01 ENCOUNTER — Other Ambulatory Visit (HOSPITAL_COMMUNITY): Payer: Self-pay

## 2022-11-02 ENCOUNTER — Other Ambulatory Visit: Payer: Self-pay

## 2022-11-27 ENCOUNTER — Other Ambulatory Visit (HOSPITAL_COMMUNITY): Payer: Self-pay

## 2022-11-27 ENCOUNTER — Other Ambulatory Visit: Payer: Self-pay | Admitting: Medical-Surgical

## 2022-11-27 MED ORDER — CETIRIZINE HCL 10 MG PO TABS
10.0000 mg | ORAL_TABLET | Freq: Every day | ORAL | 1 refills | Status: DC
  Filled 2022-11-27: qty 90, 90d supply, fill #0
  Filled 2023-02-12: qty 90, 90d supply, fill #1

## 2022-11-27 MED ORDER — METHOCARBAMOL 500 MG PO TABS
500.0000 mg | ORAL_TABLET | Freq: Three times a day (TID) | ORAL | 0 refills | Status: DC
  Filled 2022-11-27: qty 90, 30d supply, fill #0

## 2022-11-28 ENCOUNTER — Other Ambulatory Visit: Payer: Self-pay

## 2022-12-06 ENCOUNTER — Other Ambulatory Visit (HOSPITAL_COMMUNITY): Payer: Self-pay

## 2022-12-06 ENCOUNTER — Other Ambulatory Visit: Payer: Self-pay | Admitting: Medical-Surgical

## 2022-12-07 ENCOUNTER — Other Ambulatory Visit (HOSPITAL_COMMUNITY): Payer: Self-pay

## 2022-12-07 ENCOUNTER — Other Ambulatory Visit: Payer: Self-pay

## 2022-12-07 MED ORDER — LOSARTAN POTASSIUM-HCTZ 100-25 MG PO TABS
1.0000 | ORAL_TABLET | Freq: Every day | ORAL | 0 refills | Status: DC
  Filled 2022-12-07: qty 90, 90d supply, fill #0

## 2023-01-03 ENCOUNTER — Other Ambulatory Visit (HOSPITAL_COMMUNITY): Payer: Self-pay

## 2023-01-23 ENCOUNTER — Ambulatory Visit (INDEPENDENT_AMBULATORY_CARE_PROVIDER_SITE_OTHER): Payer: 59 | Admitting: Medical-Surgical

## 2023-01-23 ENCOUNTER — Ambulatory Visit: Payer: 59 | Admitting: Medical-Surgical

## 2023-01-23 ENCOUNTER — Other Ambulatory Visit (HOSPITAL_COMMUNITY): Payer: Self-pay

## 2023-01-23 ENCOUNTER — Other Ambulatory Visit: Payer: Self-pay

## 2023-01-23 DIAGNOSIS — R0683 Snoring: Secondary | ICD-10-CM | POA: Insufficient documentation

## 2023-01-23 DIAGNOSIS — R7303 Prediabetes: Secondary | ICD-10-CM

## 2023-01-23 LAB — POCT GLYCOSYLATED HEMOGLOBIN (HGB A1C): Hemoglobin A1C: 5.9 % — AB (ref 4.0–5.6)

## 2023-01-23 MED ORDER — PHENTERMINE-TOPIRAMATE ER 3.75-23 MG PO CP24
1.0000 | ORAL_CAPSULE | Freq: Every morning | ORAL | 0 refills | Status: DC
  Filled 2023-01-23 – 2023-02-12 (×2): qty 14, 14d supply, fill #0

## 2023-01-23 NOTE — Telephone Encounter (Signed)
To PCP

## 2023-01-23 NOTE — Telephone Encounter (Signed)
Not covered by insurance.

## 2023-01-23 NOTE — Progress Notes (Signed)
        Established patient visit  History, exam, impression, and plan:  1. Morbid obesity [facility]) Pleasant 52 year old female presenting today for follow-up on weight management.  She was previously being treated with Wegovy 2.4 mg weekly, tolerating well but not seen much weight loss.  Now her insurance longer covers this and she has been off of the medication for at least 1 month.  Has been working on dietary modifications and was previously exercising regularly however over the last several months, she has been extremely busy with her husband's recovery from total knee replacement as well as being primary caretaker for her mother who has advanced dementia and is now on palliative care.  Notes that she is sleeping poorly and is not able to take trazodone due to worries about her mother leaving the home with her level of confusion.  Has not had time to devote to regular intentional exercise and often eats on the run.  Is very frustrated with her weight and notes this is the highest weight she has been in her life.  Continues to be interested in medication options that may help.  We have reviewed options extensively in the past.  Previously tried to get Qsymia covered under her insurance however because she was still on Wegovy, this was denied.  Would like to retry this to see if we can get it to go through.  Resending the starter dose of Qsymia today.  Of note, she has had a worsening of her snoring and is often told that she gasps in the middle of the night.  Has sleep study several years ago however with her recent weight gain, concern for OSA.  Ordering sleep study today.  Continue to recommend making significant changes in her daily activity level as well as dietary consumption. - Home sleep test  2. Prediabetes Last hemoglobin A1c in the prediabetic range at 6.2%.  Rechecking today with result of 5.9%.  Still in the prediabetic range however looking better.  Recommend limiting dietary intake of simple  carbohydrates and concentrated sweets. - POCT HgB A1C  3. Loud snoring Sleep study ordered as above. - Home sleep test   Procedures performed this visit: None.  Return in about 2 months (around 03/25/2023) for Follow-up as scheduled.  __________________________________ Thayer Ohm, DNP, APRN, FNP-BC Primary Care and Sports Medicine [facility] [place]

## 2023-01-24 ENCOUNTER — Other Ambulatory Visit (HOSPITAL_BASED_OUTPATIENT_CLINIC_OR_DEPARTMENT_OTHER): Payer: Self-pay

## 2023-01-24 ENCOUNTER — Other Ambulatory Visit (HOSPITAL_COMMUNITY): Payer: Self-pay

## 2023-01-25 ENCOUNTER — Other Ambulatory Visit (HOSPITAL_COMMUNITY): Payer: Self-pay

## 2023-01-30 ENCOUNTER — Other Ambulatory Visit (HOSPITAL_COMMUNITY): Payer: Self-pay

## 2023-02-06 ENCOUNTER — Encounter: Payer: Self-pay | Admitting: Medical-Surgical

## 2023-02-06 ENCOUNTER — Other Ambulatory Visit (INDEPENDENT_AMBULATORY_CARE_PROVIDER_SITE_OTHER): Payer: Self-pay | Admitting: Medical-Surgical

## 2023-02-06 ENCOUNTER — Other Ambulatory Visit: Payer: Self-pay

## 2023-02-06 ENCOUNTER — Other Ambulatory Visit (HOSPITAL_COMMUNITY): Payer: Self-pay

## 2023-02-06 ENCOUNTER — Other Ambulatory Visit: Payer: Self-pay | Admitting: Medical-Surgical

## 2023-02-06 DIAGNOSIS — I10 Essential (primary) hypertension: Secondary | ICD-10-CM

## 2023-02-06 MED ORDER — OMEPRAZOLE 40 MG PO CPDR
40.0000 mg | DELAYED_RELEASE_CAPSULE | Freq: Every day | ORAL | 0 refills | Status: DC
  Filled 2023-02-06: qty 90, 90d supply, fill #0

## 2023-02-06 MED ORDER — METOPROLOL SUCCINATE ER 25 MG PO TB24
25.0000 mg | ORAL_TABLET | Freq: Every day | ORAL | 0 refills | Status: DC
Start: 2023-02-06 — End: 2023-06-26
  Filled 2023-02-06: qty 90, 90d supply, fill #0

## 2023-02-08 ENCOUNTER — Telehealth: Payer: Self-pay

## 2023-02-08 NOTE — Telephone Encounter (Addendum)
Initiated Prior authorization ZOX:WRUEAV 3.75-23MG  er capsules Via: Covermymeds Case/Key:[id number]  Status: approved  as of 02/08/23 Reason: The request has been approved. The authorization is effective from 06/16/2022 to 06/30/2022, as long as the member is enrolled in their current health plan. The request was approved as submitted. This request is approved for 1 capsule per day.Please note there is an additional authorization entered for Qsymia 7.5mg -46mg  capsules with a quantity limit of 1 capsule per day effective 06/02/2022 through 08/31/2022 Notified Pt via: Mychart

## 2023-02-09 ENCOUNTER — Other Ambulatory Visit (HOSPITAL_COMMUNITY): Payer: Self-pay

## 2023-02-12 ENCOUNTER — Other Ambulatory Visit (HOSPITAL_COMMUNITY): Payer: Self-pay

## 2023-02-12 ENCOUNTER — Other Ambulatory Visit: Payer: Self-pay

## 2023-02-13 ENCOUNTER — Other Ambulatory Visit (HOSPITAL_COMMUNITY): Payer: Self-pay

## 2023-02-13 ENCOUNTER — Other Ambulatory Visit: Payer: Self-pay

## 2023-02-24 ENCOUNTER — Other Ambulatory Visit (HOSPITAL_COMMUNITY): Payer: Self-pay

## 2023-02-24 ENCOUNTER — Other Ambulatory Visit: Payer: Self-pay | Admitting: Medical-Surgical

## 2023-02-24 ENCOUNTER — Encounter: Payer: Self-pay | Admitting: Medical-Surgical

## 2023-02-26 MED ORDER — PHENTERMINE-TOPIRAMATE ER 7.5-46 MG PO CP24
1.0000 | ORAL_CAPSULE | Freq: Every morning | ORAL | 0 refills | Status: DC
  Filled 2023-02-26 – 2023-03-01 (×2): qty 30, 30d supply, fill #0
  Filled 2023-04-07: qty 30, 30d supply, fill #1

## 2023-02-27 ENCOUNTER — Other Ambulatory Visit (HOSPITAL_COMMUNITY): Payer: Self-pay

## 2023-02-27 ENCOUNTER — Other Ambulatory Visit: Payer: Self-pay

## 2023-02-27 MED ORDER — METHOCARBAMOL 500 MG PO TABS
500.0000 mg | ORAL_TABLET | Freq: Three times a day (TID) | ORAL | 0 refills | Status: DC
  Filled 2023-02-27 – 2023-02-28 (×2): qty 90, 30d supply, fill #0

## 2023-02-28 ENCOUNTER — Other Ambulatory Visit: Payer: Self-pay

## 2023-02-28 ENCOUNTER — Encounter (HOSPITAL_COMMUNITY): Payer: Self-pay

## 2023-02-28 ENCOUNTER — Other Ambulatory Visit (HOSPITAL_COMMUNITY): Payer: Self-pay

## 2023-03-01 ENCOUNTER — Other Ambulatory Visit (HOSPITAL_COMMUNITY): Payer: Self-pay

## 2023-03-08 ENCOUNTER — Encounter: Payer: Self-pay | Admitting: Medical-Surgical

## 2023-03-23 ENCOUNTER — Other Ambulatory Visit: Payer: Self-pay | Admitting: Medical-Surgical

## 2023-03-26 ENCOUNTER — Ambulatory Visit: Payer: 59 | Admitting: Medical-Surgical

## 2023-03-26 ENCOUNTER — Other Ambulatory Visit (HOSPITAL_COMMUNITY): Payer: Self-pay

## 2023-03-26 ENCOUNTER — Other Ambulatory Visit: Payer: Self-pay

## 2023-03-26 MED ORDER — LOSARTAN POTASSIUM-HCTZ 100-25 MG PO TABS
1.0000 | ORAL_TABLET | Freq: Every day | ORAL | 0 refills | Status: DC
  Filled 2023-03-26: qty 90, 90d supply, fill #0

## 2023-04-09 ENCOUNTER — Other Ambulatory Visit (HOSPITAL_COMMUNITY): Payer: Self-pay

## 2023-04-14 ENCOUNTER — Other Ambulatory Visit (HOSPITAL_COMMUNITY): Payer: Self-pay

## 2023-04-23 DIAGNOSIS — Z0289 Encounter for other administrative examinations: Secondary | ICD-10-CM

## 2023-04-30 ENCOUNTER — Encounter: Payer: Self-pay | Admitting: Family Medicine

## 2023-04-30 ENCOUNTER — Ambulatory Visit (HOSPITAL_COMMUNITY)
Admission: RE | Admit: 2023-04-30 | Discharge: 2023-04-30 | Disposition: A | Discharge status: Home / Self Care | Payer: 59 | Source: Ambulatory Visit | Attending: Family Medicine | Admitting: Family Medicine

## 2023-04-30 ENCOUNTER — Ambulatory Visit (INDEPENDENT_AMBULATORY_CARE_PROVIDER_SITE_OTHER): Payer: 59 | Admitting: Family Medicine

## 2023-04-30 ENCOUNTER — Other Ambulatory Visit: Payer: Self-pay

## 2023-04-30 VITALS — BP 155/83 | HR 72 | Temp 98.2°F | Ht 63.0 in | Wt 252.0 lb

## 2023-04-30 DIAGNOSIS — I10 Essential (primary) hypertension: Secondary | ICD-10-CM

## 2023-04-30 DIAGNOSIS — Z1331 Encounter for screening for depression: Secondary | ICD-10-CM

## 2023-04-30 DIAGNOSIS — R7303 Prediabetes: Secondary | ICD-10-CM | POA: Diagnosis not present

## 2023-04-30 DIAGNOSIS — M5136 Other intervertebral disc degeneration, lumbar region: Secondary | ICD-10-CM | POA: Diagnosis not present

## 2023-04-30 DIAGNOSIS — F32A Depression, unspecified: Secondary | ICD-10-CM | POA: Diagnosis not present

## 2023-04-30 DIAGNOSIS — R5383 Other fatigue: Secondary | ICD-10-CM | POA: Diagnosis not present

## 2023-04-30 DIAGNOSIS — Z6841 Body Mass Index (BMI) 40.0 and over, adult: Secondary | ICD-10-CM

## 2023-04-30 DIAGNOSIS — R632 Polyphagia: Secondary | ICD-10-CM

## 2023-04-30 DIAGNOSIS — E785 Hyperlipidemia, unspecified: Secondary | ICD-10-CM | POA: Insufficient documentation

## 2023-04-30 DIAGNOSIS — E7849 Other hyperlipidemia: Secondary | ICD-10-CM

## 2023-04-30 DIAGNOSIS — R0602 Shortness of breath: Secondary | ICD-10-CM | POA: Diagnosis not present

## 2023-04-30 DIAGNOSIS — M51369 Other intervertebral disc degeneration, lumbar region without mention of lumbar back pain or lower extremity pain: Secondary | ICD-10-CM | POA: Insufficient documentation

## 2023-04-30 NOTE — Progress Notes (Unsigned)
Chief Complaint:   OBESITY Amy Diaz (MR# [id number]) is a 52 y.o. female who presents for evaluation and treatment of obesity and related comorbidities. Current BMI is Body mass index is 44.64 kg/m. Amy Diaz has been struggling with her weight for many years and has been unsuccessful in either losing weight, maintaining weight loss, or reaching her healthy weight goal.  Amy Diaz is currently in the action stage of change and ready to dedicate time achieving and maintaining a healthier weight. Amy Diaz is interested in becoming our patient and working on intensive lifestyle modifications including (but not limited to) diet and exercise for weight loss.  Patient was last seen by Dr. Lawson Radar in April 2023 at 237 pounds.  She has been on Wegovy followed by Qsymia.  She notes over eating.  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  She works days for [employer].  She is married and her mom lives with them (mom has dementia).  Amy Diaz's habits were reviewed today and are as follows: Her family eats meals together, she thinks her family will eat healthier with her, her desired weight loss is 87 lbs, she started gaining excessive weight approximately 52 years old, her heaviest weight ever was 261 pounds, she is a picky eater and doesn't like to eat healthier foods, she has significant food cravings issues, she snacks frequently in the evenings, she skips meals frequently, she is frequently drinking liquids with calories, she frequently makes poor food choices, and she struggles with emotional eating.  Depression Screen Amy Diaz's Food and Mood (modified PHQ-9) score was 6.  Subjective:   1. Other fatigue Amy Diaz admits to daytime somnolence and admits to waking up still tired. Patient has a history of symptoms of daytime fatigue, morning fatigue, and morning headache. Amy Diaz generally gets 4 hours of sleep per night, and states that she has nightime awakenings. Snoring is present. Apneic episodes are  not present. Epworth Sleepiness Score is 12.  EKG-normal sinus rhythm at 65 bpm.  2. SOBOE (shortness of breath on exertion) Amy Diaz notes increasing shortness of breath with exercising and seems to be worsening over time with weight gain. She notes getting out of breath sooner with activity than she used to. This has not gotten worse recently. Amy Diaz denies shortness of breath at rest or orthopnea.  3. Essential hypertension Patient's blood pressure is high on losartan-hydrochlorothiazide 100-25 mg once daily and metoprolol XL 25 mg once daily.  She denies chest pain or dyspnea on exertion.  4. Other hyperlipidemia Patient is on a atorvastatin 10 mg once daily.  She has some myalgias.  Her last FLP on 05/11/2022 showed total cholesterol 265 and LDL 168.  5. DDD (degenerative disc disease), lumbar Patient is with some right side radiculopathy.  She notes back pain and DJD pain in her knees.  She has limited walking.  She is using topical Voltaren, and prescription Robaxin and Celebrex.  6. Polyphagia Patient is on Qsymia 7.5-46 mg once daily by her PCP.  She started 3 months ago.  She started at 261 pounds.  She is not feeling much satiety.  She had used phentermine in the past.  7. Pre-diabetes Patient's last A1c was 5.9 on 01/23/2023.  She did try metformin without weight loss.  She had some diarrhea.  Assessment/Plan:   1. Other fatigue Amy Diaz does feel that her weight is causing her energy to be lower than it should be. Fatigue may be related to obesity, depression or many other  causes. Labs will be ordered, and in the meanwhile, Amy Diaz will focus on self care including making healthy food choices, increasing physical activity and focusing on stress reduction.  - TSH - T4, free - T3 - Folate - Comprehensive metabolic panel - VITAMIN D 25 Hydroxy (Vit-D Deficiency, Fractures) - Vitamin B12 - CBC with Differential/Platelet  2. SOBOE (shortness of breath on exertion) Amy Diaz does feel  that she gets out of breath more easily that she used to when she exercises. Amy Diaz's shortness of breath appears to be obesity related and exercise induced. She has agreed to work on weight loss and gradually increase exercise to treat her exercise induced shortness of breath. Will continue to monitor closely.  3. Essential hypertension Patient is to look for improvements with weight loss.  4. Other hyperlipidemia We will check labs today.  Patient will continue her current medications and we will begin her prescribed diet.  - Lipid panel  5. DDD (degenerative disc disease), lumbar Patient is to look for improvements with weight loss.  6. Polyphagia We will check labs today.  Patient will follow-up with her PCP.  - Insulin, random  7. Pre-diabetes We will check labs today, and we will follow-up at patient's next office visit.  - Insulin, random - Hemoglobin A1c  8. Depression screen [name] had a positive depression screening. Depression is commonly associated with obesity and often results in emotional eating behaviors. We will monitor this closely and work on CBT to help improve the non-hunger eating patterns. Referral to Psychology may be required if no improvement is seen as she continues in our clinic.  9. BMI 40.0-44.9, adult (HCC)  10. Morbid obesity with starting BMI 44.7 Amy Diaz is currently in the action stage of change and her goal is to continue with weight loss efforts. I recommend Amy Diaz begin the structured treatment plan as follows:  She has agreed to the Category 2 Plan.  100-calorie snack list was given.  Exercise goals: All adults should avoid inactivity. Some physical activity is better than none, and adults who participate in any amount of physical activity gain some health benefits.  Patient is to think about options.  Behavioral modification strategies: increasing lean protein intake, increasing water intake, decreasing liquid calories, decreasing eating out,  no skipping meals, keeping healthy foods in the home, better snacking choices, and decreasing junk food.  She was informed of the importance of frequent follow-up visits to maximize her success with intensive lifestyle modifications for her multiple health conditions. She was informed we would discuss her lab results at her next visit unless there is a critical issue that needs to be addressed sooner. Amy Diaz agreed to keep her next visit at the agreed upon time to discuss these results.  Objective:   Blood pressure (!) 155/83, pulse 72, temperature 98.2 F (36.8 C), height 5\' 3"  (1.6 m), weight 252 lb (114.3 kg), SpO2 99%. Body mass index is 44.64 kg/m.  EKG: Normal sinus rhythm, rate 65 BPM.  Indirect Calorimeter completed today shows a VO2 of 263 and a REE of 1814.  Her calculated basal metabolic rate is 1610 thus her basal metabolic rate is worse than expected.  General: Cooperative, alert, well developed, in no acute distress. HEENT: Conjunctivae and lids unremarkable. Cardiovascular: Regular rhythm.  Lungs: Normal work of breathing. Neurologic: No focal deficits.   Lab Results  Component Value Date   CREATININE 1.14 (H) 05/11/2022   BUN 16 05/11/2022   NA 141 05/11/2022   K 4.1 05/11/2022  CL 104 05/11/2022   CO2 28 05/11/2022   Lab Results  Component Value Date   ALT 21 05/11/2022   AST 19 05/11/2022   ALKPHOS 67 11/09/2021   BILITOT 0.3 05/11/2022   Lab Results  Component Value Date   HGBA1C 5.9 (A) 01/23/2023   HGBA1C 6.2 (H) 05/11/2022   HGBA1C 5.9 (H) 11/09/2021   HGBA1C 5.8 (H) 04/11/2021   HGBA1C 5.7 (H) 06/09/2020   Lab Results  Component Value Date   INSULIN 22.5 11/09/2021   INSULIN 19.6 04/11/2021   Lab Results  Component Value Date   TSH 1.230 04/11/2021   Lab Results  Component Value Date   CHOL 265 (H) 05/11/2022   HDL 75 05/11/2022   LDLCALC 168 (H) 05/11/2022   TRIG 99 05/11/2022   CHOLHDL 3.5 05/11/2022   Lab Results  Component Value  Date   WBC 6.6 05/11/2022   HGB 12.4 05/11/2022   HCT 36.1 05/11/2022   MCV 88.3 05/11/2022   PLT 395 05/11/2022   No results found for: "IRON", "TIBC", "FERRITIN"  Attestation Statements:   Reviewed by clinician on day of visit: allergies, medications, problem list, medical history, surgical history, family history, social history, and previous encounter notes.  Time spent on visit including pre-visit chart review and post-visit charting and care was 40 minutes.   Trude Mcburney, am acting as transcriptionist for Seymour Bars, DO.  I have reviewed the above documentation for accuracy and completeness, and I agree with the above. - ***

## 2023-05-01 LAB — CBC WITH DIFFERENTIAL/PLATELET
Basophils Absolute: 0.1 10*3/uL (ref 0.0–0.2)
Basos: 1 %
EOS (ABSOLUTE): 0.4 10*3/uL (ref 0.0–0.4)
Eos: 8 %
Hematocrit: 39 % (ref 34.0–46.6)
Hemoglobin: 12.7 g/dL (ref 11.1–15.9)
Immature Grans (Abs): 0 10*3/uL (ref 0.0–0.1)
Immature Granulocytes: 0 %
Lymphocytes Absolute: 2.4 10*3/uL (ref 0.7–3.1)
Lymphs: 53 %
MCH: 29.4 pg (ref 26.6–33.0)
MCHC: 32.6 g/dL (ref 31.5–35.7)
MCV: 90 fL (ref 79–97)
Monocytes Absolute: 0.3 10*3/uL (ref 0.1–0.9)
Monocytes: 7 %
Neutrophils Absolute: 1.4 10*3/uL (ref 1.4–7.0)
Neutrophils: 31 %
Platelets: 398 10*3/uL (ref 150–450)
RBC: 4.32 x10E6/uL (ref 3.77–5.28)
RDW: 13.6 % (ref 11.7–15.4)
WBC: 4.6 10*3/uL (ref 3.4–10.8)

## 2023-05-01 LAB — COMPREHENSIVE METABOLIC PANEL
ALT: 24 [IU]/L (ref 0–32)
AST: 21 [IU]/L (ref 0–40)
Albumin: 4.3 g/dL (ref 3.8–4.9)
Alkaline Phosphatase: 75 [IU]/L (ref 44–121)
BUN/Creatinine Ratio: 15 (ref 9–23)
BUN: 15 mg/dL (ref 6–24)
Bilirubin Total: 0.2 mg/dL (ref 0.0–1.2)
CO2: 26 mmol/L (ref 20–29)
Calcium: 10.2 mg/dL (ref 8.7–10.2)
Chloride: 105 mmol/L (ref 96–106)
Creatinine, Ser: 1.02 mg/dL — ABNORMAL HIGH (ref 0.57–1.00)
Globulin, Total: 2.7 g/dL (ref 1.5–4.5)
Glucose: 98 mg/dL (ref 70–99)
Potassium: 3.9 mmol/L (ref 3.5–5.2)
Sodium: 142 mmol/L (ref 134–144)
Total Protein: 7 g/dL (ref 6.0–8.5)
eGFR: 66 mL/min/{1.73_m2} (ref >59)

## 2023-05-01 LAB — LIPID PANEL
Chol/HDL Ratio: 2.8 {ratio} (ref 0.0–4.4)
Cholesterol, Total: 196 mg/dL (ref 100–199)
HDL: 71 mg/dL (ref >39)
LDL Chol Calc (NIH): 110 mg/dL — ABNORMAL HIGH (ref 0–99)
Triglycerides: 86 mg/dL (ref 0–149)
VLDL Cholesterol Cal: 15 mg/dL (ref 5–40)

## 2023-05-01 LAB — VITAMIN B12: Vitamin B-12: 629 pg/mL (ref 232–1245)

## 2023-05-01 LAB — FOLATE: Folate: 16.1 ng/mL (ref >3.0)

## 2023-05-01 LAB — TSH: TSH: 1.63 u[IU]/mL (ref 0.450–4.500)

## 2023-05-01 LAB — INSULIN, RANDOM: INSULIN: 33.8 u[IU]/mL — ABNORMAL HIGH (ref 2.6–24.9)

## 2023-05-01 LAB — T4, FREE: Free T4: 1.27 ng/dL (ref 0.82–1.77)

## 2023-05-01 LAB — VITAMIN D 25 HYDROXY (VIT D DEFICIENCY, FRACTURES): Vit D, 25-Hydroxy: 34.6 ng/mL (ref 30.0–100.0)

## 2023-05-01 LAB — T3: T3, Total: 115 ng/dL (ref 71–180)

## 2023-05-01 LAB — HEMOGLOBIN A1C
Est. average glucose Bld gHb Est-mCnc: 131 mg/dL
Hgb A1c MFr Bld: 6.2 % — ABNORMAL HIGH (ref 4.8–5.6)

## 2023-05-09 ENCOUNTER — Ambulatory Visit (INDEPENDENT_AMBULATORY_CARE_PROVIDER_SITE_OTHER): Payer: 59 | Admitting: Medical-Surgical

## 2023-05-09 ENCOUNTER — Encounter: Payer: Self-pay | Admitting: Medical-Surgical

## 2023-05-09 ENCOUNTER — Telehealth: Payer: Self-pay | Admitting: Medical-Surgical

## 2023-05-09 VITALS — BP 161/82 | HR 67 | Resp 20 | Ht 63.0 in | Wt 253.5 lb

## 2023-05-09 DIAGNOSIS — I10 Essential (primary) hypertension: Secondary | ICD-10-CM | POA: Diagnosis not present

## 2023-05-09 DIAGNOSIS — R0683 Snoring: Secondary | ICD-10-CM

## 2023-05-09 DIAGNOSIS — G4700 Insomnia, unspecified: Secondary | ICD-10-CM | POA: Diagnosis not present

## 2023-05-09 DIAGNOSIS — F430 Acute stress reaction: Secondary | ICD-10-CM

## 2023-05-09 MED ORDER — VALSARTAN-HYDROCHLOROTHIAZIDE 320-25 MG PO TABS: 1.0000 | ORAL_TABLET | Freq: Every day | ORAL | 0 refills | Status: DC

## 2023-05-09 MED ORDER — BUSPIRONE HCL 5 MG PO TABS
5.0000 mg | ORAL_TABLET | Freq: Two times a day (BID) | ORAL | 3 refills | Status: DC | PRN
Start: 2023-05-09 — End: 2023-12-21

## 2023-05-09 MED ORDER — TRAZODONE HCL 50 MG PO TABS: 25.0000 mg | ORAL_TABLET | Freq: Every evening | ORAL | 1 refills | Status: DC | PRN

## 2023-05-09 NOTE — Telephone Encounter (Signed)
Sleep study orders, clinical notes, demographics, and copies of insurance cards have been faxed to [facility] at [phone]. Office will contact patient to complete online registration in order to ship home sleep study equipment.

## 2023-05-09 NOTE — Progress Notes (Signed)
M m                                                                                                                                                                                                                                                                                                                                              Established patient visit  History, exam, impression, and plan:  1. Essential hypertension Very pleasant 52 year old female presenting today with a history of hypertension.  She is currently taking losartan-hydrochlorothiazide 100-25 mg daily along with Toprol-XL 25 mg daily.  Has Lasix 20 mg daily as needed however has not taken this in the last couple of months.  Notes that her stress level is very high with taking care of her mother who has dementia.  Also not sleeping well with insomnia concerns as well as loud snoring at night.  Never got the sleep study done that was ordered in June.  Occasionally has some sharp stabbing chest pain over the sternum that lasts only about 10 seconds and is not related to activity.  Not accompanied by shortness of breath, diaphoresis, nausea, vomiting, jaw pain, arm pain, or palpitations.  No significant lower extremity edema.  HRRR, S1/S2 normal.  Lungs CTA.  Respirations even and unlabored.  Blood pressure is elevated in the office today and has been at home/work on multiple occasions despite her current medications.  She took amlodipine in the past and feels that this contributed to lower extremity edema.  For now, we will switch losartan-HCTZ to valsartan-HCTZ 320-25 mg daily.  Continue Toprol-XL 25 mg daily.  Monitor blood pressure at home and return in 2 weeks for nurse visit to recheck blood pressure.  2. Morbid obesity (HCC) She is following with healthy weight and wellness now.  Has about 1 week left on Qsymia and would prefer to discontinue this.  She is not  able to exercise due to her very busy schedule but reports that she is  following the dietary recommendations closely.  Has an upcoming appointment and will discuss further options with them.  3. Insomnia, unspecified type Long history of insomnia that has been worsened since she is caring for her mother.  Previously took trazodone which helped with her sleep however did not have any more of this at home.  Interested in getting restarted.  Refilling trazodone today. - traZODone (DESYREL) 50 MG tablet; Take 0.5-1 tablets (25-50 mg total) by mouth at bedtime as needed for sleep.  Dispense: 90 tablet; Refill: 1  4. Loud snoring Home sleep study noted as above ordered in June.  This has not been completed.  The order is still active so I have routed a message to our referral coordinator to see if we can expedite getting this done.  5. Stress reaction She is still struggling tremendously with being overstressed but has a lot of anxiety with all the things going on.  Her reaction is very understandable however it is now starting to affect other parts of her life.  We are restarting trazodone which may help some to get some adequate sleep.  Discussed other options.  Trialing BuSpar 5-15 mg twice daily as needed.  Discussed counseling.  She felt that she would did not have a lot of time for this but after discussion of the benefits, she is amenable.  Referring to behavioral health to get her in touch with [facility] that works out of our office. - Ambulatory referral to [facility]   Procedures performed this visit: None.  Return in about 2 weeks (around 05/23/2023) for nurse visit for BP check.  __________________________________ Thayer Ohm, DNP, APRN, FNP-BC Primary Care and Sports [facility]

## 2023-05-15 ENCOUNTER — Ambulatory Visit: Payer: 59 | Admitting: Family Medicine

## 2023-05-15 ENCOUNTER — Encounter: Payer: Self-pay | Admitting: Family Medicine

## 2023-05-15 VITALS — BP 124/82 | HR 67 | Temp 98.2°F | Ht 63.0 in | Wt 246.0 lb

## 2023-05-15 DIAGNOSIS — R7303 Prediabetes: Secondary | ICD-10-CM

## 2023-05-15 DIAGNOSIS — E559 Vitamin D deficiency, unspecified: Secondary | ICD-10-CM | POA: Diagnosis not present

## 2023-05-15 DIAGNOSIS — E662 Morbid (severe) obesity with alveolar hypoventilation: Secondary | ICD-10-CM | POA: Diagnosis not present

## 2023-05-15 DIAGNOSIS — Z6841 Body Mass Index (BMI) 40.0 and over, adult: Secondary | ICD-10-CM | POA: Diagnosis not present

## 2023-05-15 DIAGNOSIS — E66813 Obesity, class 3: Secondary | ICD-10-CM | POA: Diagnosis not present

## 2023-05-15 DIAGNOSIS — E785 Hyperlipidemia, unspecified: Secondary | ICD-10-CM | POA: Diagnosis not present

## 2023-05-15 DIAGNOSIS — E88819 Insulin resistance, unspecified: Secondary | ICD-10-CM | POA: Insufficient documentation

## 2023-05-15 MED ORDER — METFORMIN HCL 500 MG PO TABS
500.0000 mg | ORAL_TABLET | Freq: Every day | ORAL | 0 refills | Status: DC
Start: 2023-05-15 — End: 2023-06-06

## 2023-05-15 NOTE — Assessment & Plan Note (Signed)
Reviewed labs with patient Fasting insulin was HIGH and she has signs of IR such has carb/ sugar cravings and hyperphagia.  Reviewed ways to help improve IR such has increasing exercise time, reducing starches and sweets and further weight loss In addition, will use metformin 500 mg once daily with food Repeat labs in 2-3 mos

## 2023-05-15 NOTE — Progress Notes (Signed)
Office: [phone]  /  Fax: [phone]  WEIGHT SUMMARY AND BIOMETRICS  Starting Date: 04/30/23  Starting Weight: 252lb   Weight Lost Since Last Visit: 6lb   Vitals Temp: 98.2 F (36.8 C) BP: 124/82 Pulse Rate: 67 SpO2: 98 %   Body Composition  Body Fat %: 47.9 % Fat Mass (lbs): 118.2 lbs Muscle Mass (lbs): 122 lbs Total Body Water (lbs): 80.6 lbs Visceral Fat Rating : 16     HPI  Chief Complaint: OBESITY  Amy Diaz is here to discuss her progress with her obesity treatment plan. She is on the the Category 2 Plan and states she is following her eating plan approximately 98 % of the time. She states she is exercising 0 minutes 0 times per week.   Interval History:  Since last office visit she is down 6 lb She is down 0 lb of muscle mass and down 5.6 lb of body fat in 2 weeks She is doing well on her meal plan, still a bit hungry at the end of breakfast. Getting in all 3 meals and snack calories Has a good support system Has not started exercise yet Trying to get in more water  Pharmacotherapy: none  PHYSICAL EXAM:  Blood pressure 124/82, pulse 67, temperature 98.2 F (36.8 C), height 5\' 3"  (1.6 m), weight 246 lb (111.6 kg), SpO2 98%. Body mass index is 43.58 kg/m.  General: She is overweight, cooperative, alert, well developed, and in no acute distress. PSYCH: Has normal mood, affect and thought process.   Lungs: Normal breathing effort, no conversational dyspnea.  ASSESSMENT AND PLAN  TREATMENT PLAN FOR OBESITY:  Recommended Dietary Goals  Amy Diaz is currently in the action stage of change. As such, her goal is to continue weight management plan. She has agreed to the Category 2 Plan. - dining out guide given - reviewed adjustments on AVS  Behavioral Intervention  We discussed the following Behavioral Modification Strategies today: increasing lean protein intake to established goals, increasing vegetables, increasing fiber rich foods, avoiding  skipping meals, increasing water intake , work on meal planning and preparation, keeping healthy foods at home, identifying sources and decreasing liquid calories, planning for success, and continue to work on maintaining a reduced calorie state, getting the recommended amount of protein, incorporating whole foods, making healthy choices, staying well hydrated and practicing mindfulness when eating..  Additional resources provided today: NA  Recommended Physical Activity Goals  Amy Diaz has been advised to work up to 150 minutes of moderate intensity aerobic activity a week and strengthening exercises 2-3 times per week for cardiovascular health, weight loss maintenance and preservation of muscle mass.   She has agreed to Think about enjoyable ways to increase daily physical activity and overcoming barriers to exercise and Increase physical activity in their day and reduce sedentary time (increase NEAT).  Pharmacotherapy changes for the treatment of obesity: begin metformin 500 mg once daily with food  ASSOCIATED CONDITIONS ADDRESSED TODAY  Prediabetes Assessment & Plan: Lab Results  Component Value Date   HGBA1C 6.2 (H) 04/30/2023   Reviewed labs with patient She is prediabetic and is at risk for developing type II diabetes with a + fam hx and a BMI of 43 She has started prescribed meal plan, reducing sweets and starches  We discussed a plan to increase exercise frequency Continue prescribed meal plan Begin metformin 500 mg once daily with food  Orders: -     metFORMIN HCl; Take 1 tablet (500 mg total) by mouth daily with  breakfast.  Dispense: 30 tablet; Refill: 0  Class 3 obesity with alveolar hypoventilation, serious comorbidity, and body mass index (BMI) of 40.0 to 44.9 in adult Amy Diaz)  Vitamin D deficiency Assessment & Plan: Last vitamin D Lab Results  Component Value Date   VD25OH 34.6 04/30/2023   Reviewed labs with patient She had been on an RX vitamin D supplement but  ran out She has felt more tired lately We discussed the importance of vitamin D for energy levels, immune health, bone health.  Begin OTC vitamin D3 4,000 international units  daily   Insulin resistance Assessment & Plan: Reviewed labs with patient Fasting insulin was HIGH and she has signs of IR such has carb/ sugar cravings and hyperphagia.  Reviewed ways to help improve IR such has increasing exercise time, reducing starches and sweets and further weight loss In addition, will use metformin 500 mg once daily with food Repeat labs in 2-3 mos  Orders: -     metFORMIN HCl; Take 1 tablet (500 mg total) by mouth daily with breakfast.  Dispense: 30 tablet; Refill: 0  Hyperlipidemia, unspecified hyperlipidemia type Assessment & Plan: Lab Results  Component Value Date   CHOL 196 04/30/2023   HDL 71 04/30/2023   LDLCALC 110 (H) 04/30/2023   TRIG 86 04/30/2023   CHOLHDL 2.8 04/30/2023   Reviewed labs with patient Taking Lipitor 10 mg daily without adverse SE Has recently started on her low saturated fat diet  Repeat FLP in 6 mos If LDL is still > 100, will plan to increase dosage of Lipitor       She was informed of the importance of frequent follow up visits to maximize her success with intensive lifestyle modifications for her multiple health conditions.   ATTESTASTION STATEMENTS:  Reviewed by clinician on day of visit: allergies, medications, problem list, medical history, surgical history, family history, social history, and previous encounter notes pertinent to obesity diagnosis.   I have personally spent 30 minutes total time today in preparation, patient care, nutritional counseling and documentation for this visit, including the following: review of clinical lab tests; review of medical tests/procedures/services.      Amy Brink, DO DABFM, DABOM [facility] [address] [place], [place] [address] [phone]

## 2023-05-15 NOTE — Assessment & Plan Note (Signed)
Lab Results  Component Value Date   CHOL 196 04/30/2023   HDL 71 04/30/2023   LDLCALC 110 (H) 04/30/2023   TRIG 86 04/30/2023   CHOLHDL 2.8 04/30/2023   Reviewed labs with patient Taking Lipitor 10 mg daily without adverse SE Has recently started on her low saturated fat diet  Repeat FLP in 6 mos If LDL is still > 100, will plan to increase dosage of Lipitor

## 2023-05-15 NOTE — Assessment & Plan Note (Signed)
Last vitamin D Lab Results  Component Value Date   VD25OH 34.6 04/30/2023   Reviewed labs with patient She had been on an RX vitamin D supplement but ran out She has felt more tired lately We discussed the importance of vitamin D for energy levels, immune health, bone health.  Begin OTC vitamin D3 4,000 international units  daily

## 2023-05-15 NOTE — Assessment & Plan Note (Signed)
Lab Results  Component Value Date   HGBA1C 6.2 (H) 04/30/2023   Reviewed labs with patient She is prediabetic and is at risk for developing type II diabetes with a + fam hx and a BMI of 43 She has started prescribed meal plan, reducing sweets and starches  We discussed a plan to increase exercise frequency Continue prescribed meal plan Begin metformin 500 mg once daily with food

## 2023-05-15 NOTE — Patient Instructions (Addendum)
At breakfast, you can add a 3rd egg OR a piece of [place] sausage  Mid morning, you can pick a high protein snack: Protein shake + a piece of fruit OR greek yogurt or cottage cheese + fruit (any)  Great job with meals/ meal planning!  At dinner, stay Lower carb: lean protein + veggies (( Can do more veggies than what is on plan ))   At dinner, think about easy protein options: Pre cooked chicken in frozen section [employer] or Bare chicken nuggets/ tenders Tuna salad -- made with plain greek yogurt + 1/2 light mayo Kevin's meals OK to add some low carb tortillas to dinner  Think about tracking steps and ideas for exercise  Work on increasing water intake to 100 oz/ day

## 2023-05-16 ENCOUNTER — Other Ambulatory Visit: Payer: Self-pay | Admitting: Medical-Surgical

## 2023-05-23 ENCOUNTER — Ambulatory Visit (INDEPENDENT_AMBULATORY_CARE_PROVIDER_SITE_OTHER): Payer: 59 | Admitting: Medical-Surgical

## 2023-05-23 VITALS — BP 112/72 | HR 61 | Ht 63.0 in | Wt 252.0 lb

## 2023-05-23 DIAGNOSIS — I10 Essential (primary) hypertension: Secondary | ICD-10-CM | POA: Diagnosis not present

## 2023-05-23 NOTE — Progress Notes (Signed)
Medical screening examination/treatment was performed by qualified clinical staff member and as supervising provider I was immediately available for consultation/collaboration. I have reviewed documentation and agree with assessment and plan. ° °Opie Maclaughlin L. Kreston Ahrendt, DNP, APRN, FNP-BC °[facility] °Primary Care and Sports Medicine ° °

## 2023-05-23 NOTE — Progress Notes (Signed)
Patient is here for blood pressure check.   Previous BP was 161/82  1st BP today: 112/72  Denies chest pain, dizziness, shortness of breath, severe headache, or nosebleeds. Taking medication as prescribed. Denies missed doses  Return in 3 months for labs and BP check.

## 2023-05-31 ENCOUNTER — Other Ambulatory Visit: Payer: Self-pay | Admitting: Medical-Surgical

## 2023-06-01 ENCOUNTER — Other Ambulatory Visit (INDEPENDENT_AMBULATORY_CARE_PROVIDER_SITE_OTHER): Payer: Self-pay | Admitting: Medical-Surgical

## 2023-06-01 ENCOUNTER — Other Ambulatory Visit (HOSPITAL_COMMUNITY): Payer: Self-pay

## 2023-06-01 MED ORDER — TRAZODONE HCL 50 MG PO TABS
25.0000 mg | ORAL_TABLET | Freq: Every evening | ORAL | 1 refills | Status: DC | PRN
  Filled 2023-06-01: qty 90, 90d supply, fill #0

## 2023-06-01 MED ORDER — VALSARTAN-HYDROCHLOROTHIAZIDE 320-25 MG PO TABS
1.0000 | ORAL_TABLET | Freq: Every day | ORAL | 0 refills | Status: DC
  Filled 2023-06-01 – 2023-06-11 (×2): qty 90, 90d supply, fill #0

## 2023-06-01 MED ORDER — BUSPIRONE HCL 5 MG PO TABS
5.0000 mg | ORAL_TABLET | Freq: Two times a day (BID) | ORAL | 3 refills | Status: DC | PRN
  Filled 2023-06-01: qty 90, 15d supply, fill #0

## 2023-06-02 ENCOUNTER — Other Ambulatory Visit (HOSPITAL_COMMUNITY): Payer: Self-pay

## 2023-06-04 ENCOUNTER — Other Ambulatory Visit: Payer: Self-pay

## 2023-06-04 ENCOUNTER — Other Ambulatory Visit (HOSPITAL_COMMUNITY): Payer: Self-pay

## 2023-06-05 ENCOUNTER — Other Ambulatory Visit: Payer: Self-pay

## 2023-06-05 MED ORDER — OMEPRAZOLE 40 MG PO CPDR
40.0000 mg | DELAYED_RELEASE_CAPSULE | Freq: Every day | ORAL | 0 refills | Status: DC
  Filled 2023-06-05: qty 90, 90d supply, fill #0

## 2023-06-06 ENCOUNTER — Other Ambulatory Visit: Payer: Self-pay

## 2023-06-06 ENCOUNTER — Other Ambulatory Visit (HOSPITAL_COMMUNITY): Payer: Self-pay

## 2023-06-06 ENCOUNTER — Ambulatory Visit (INDEPENDENT_AMBULATORY_CARE_PROVIDER_SITE_OTHER): Payer: 59 | Admitting: Family Medicine

## 2023-06-06 ENCOUNTER — Encounter: Payer: Self-pay | Admitting: Family Medicine

## 2023-06-06 VITALS — BP 127/74 | HR 61 | Temp 97.7°F | Ht 63.0 in | Wt 246.0 lb

## 2023-06-06 DIAGNOSIS — E559 Vitamin D deficiency, unspecified: Secondary | ICD-10-CM | POA: Diagnosis not present

## 2023-06-06 DIAGNOSIS — E66813 Obesity, class 3: Secondary | ICD-10-CM

## 2023-06-06 DIAGNOSIS — Z6841 Body Mass Index (BMI) 40.0 and over, adult: Secondary | ICD-10-CM

## 2023-06-06 DIAGNOSIS — E662 Morbid (severe) obesity with alveolar hypoventilation: Secondary | ICD-10-CM

## 2023-06-06 DIAGNOSIS — R7303 Prediabetes: Secondary | ICD-10-CM | POA: Diagnosis not present

## 2023-06-06 DIAGNOSIS — E88819 Insulin resistance, unspecified: Secondary | ICD-10-CM

## 2023-06-06 MED ORDER — METFORMIN HCL 500 MG PO TABS
500.0000 mg | ORAL_TABLET | Freq: Every day | ORAL | 0 refills | Status: DC
  Filled 2023-06-06: qty 30, 30d supply, fill #0

## 2023-06-06 NOTE — Patient Instructions (Signed)
Try out [employer] Try out Lower sugar/ high protein oatmeal You can swap out your bread serving for grits or oatmeal Opt for Zero sugar tea/ juice Bring healthy snacks to work (fresh fruit, hard boiled eggs, string cheese, skinny pop, 100 calorie nut packs)  Plan to : Add in a home workout 2 days/ wk

## 2023-06-06 NOTE — Assessment & Plan Note (Signed)
Last vitamin D Lab Results  Component Value Date   VD25OH 34.6 04/30/2023   She is taking OTC vitamin D 4,000 international units  daily Energy level is stable  Repeat lab in Jan

## 2023-06-06 NOTE — Assessment & Plan Note (Signed)
Lab Results  Component Value Date   HGBA1C 6.2 (H) 04/30/2023   She craves starches and sweets but is mindful about her intake.  She is doing well on Metformin 500 mg once daily with food without adverse SE.  Continue to work on a low sugar diet.

## 2023-06-06 NOTE — Progress Notes (Signed)
Office: [phone]  /  Fax: [phone]  WEIGHT SUMMARY AND BIOMETRICS  Starting Date: 04/30/23  Starting Weight: 252lb   Weight Lost Since Last Visit: 0lb   Vitals Temp: 97.7 F (36.5 C) BP: 127/74 Pulse Rate: 61 SpO2: 100 %   Body Composition  Body Fat %: 47.9 % Fat Mass (lbs): 118.2 lbs Muscle Mass (lbs): 122 lbs Total Body Water (lbs): 83 lbs Visceral Fat Rating : 16    HPI  Chief Complaint: OBESITY  Rakel is here to discuss her progress with her obesity treatment plan. She is on the the Category 2 Plan and states she is following her eating plan approximately 60 % of the time. She states she is exercising 0 minutes 0 times per week.   Interval History:  Since last office visit she is down 0 lb Her fridge went out for 2 weeks and she had to eat out She has been mindful and gets in protein She is trying to keep sweets out of the house She is net down 6 lb in the past 6 weeks She is doing well on metformin 500 mg daily.   She is craving raspberry tea (full sugar Gold Leaf) She is planning to start home workouts soon She is not tracking steps but she is active at work  Pharmacotherapy: metformin 500 mg once daily for PDM  PHYSICAL EXAM:  Blood pressure 127/74, pulse 61, temperature 97.7 F (36.5 C), height 5\' 3"  (1.6 m), weight 246 lb (111.6 kg), SpO2 100%. Body mass index is 43.58 kg/m.  General: She is overweight, cooperative, alert, well developed, and in no acute distress. PSYCH: Has normal mood, affect and thought process.   Lungs: Normal breathing effort, no conversational dyspnea.  ASSESSMENT AND PLAN  TREATMENT PLAN FOR OBESITY:  Recommended Dietary Goals  Madeleyn is currently in the action stage of change. As such, her goal is to continue weight management plan. She has agreed to the Category 2 Plan. - counseled on food/ drink options to keep her in a calorie deficit and reduce her intake of excess sugar as reviewed together on  AVS  Behavioral Intervention  We discussed the following Behavioral Modification Strategies today: increasing lean protein intake to established goals, increasing vegetables, increasing lower glycemic fruits, avoiding skipping meals, increasing water intake , work on meal planning and preparation, reading food labels , identifying sources and decreasing liquid calories, avoiding temptations and identifying enticing environmental cues, planning for success, better snacking choices, and continue to work on maintaining a reduced calorie state, getting the recommended amount of protein, incorporating whole foods, making healthy choices, staying well hydrated and practicing mindfulness when eating..  Additional resources provided today: NA  Recommended Physical Activity Goals  Lorriann has been advised to work up to 150 minutes of moderate intensity aerobic activity a week and strengthening exercises 2-3 times per week for cardiovascular health, weight loss maintenance and preservation of muscle mass.   She has agreed to Think about enjoyable ways to increase daily physical activity and overcoming barriers to exercise and Increase physical activity in their day and reduce sedentary time (increase NEAT).  Pharmacotherapy changes for the treatment of obesity: none  ASSOCIATED CONDITIONS ADDRESSED TODAY  Prediabetes Assessment & Plan: Lab Results  Component Value Date   HGBA1C 6.2 (H) 04/30/2023   She craves starches and sweets but is mindful about her intake.  She is doing well on Metformin 500 mg once daily with food without adverse SE.  Continue to work on  a low sugar diet.  Orders: -     metFORMIN HCl; Take 1 tablet (500 mg total) by mouth daily with breakfast.  Dispense: 30 tablet; Refill: 0  Class 3 obesity with alveolar hypoventilation and body mass index (BMI) of 40.0 to 44.9 in adult, unspecified whether serious comorbidity present (HCC)  Insulin resistance Assessment & Plan: Doing  well on metformin 500 mg for IR and PDM with hyperinsulinemia. She is working on reducing her intake of added sugar and starches and has room for improvement with exercise.  Consider increasing metformin dose. Repeat labs in Jan  Orders: -     metFORMIN HCl; Take 1 tablet (500 mg total) by mouth daily with breakfast.  Dispense: 30 tablet; Refill: 0  Vitamin D deficiency Assessment & Plan: Last vitamin D Lab Results  Component Value Date   VD25OH 34.6 04/30/2023   She is taking OTC vitamin D 4,000 international units  daily Energy level is stable  Repeat lab in Jan        She was informed of the importance of frequent follow up visits to maximize her success with intensive lifestyle modifications for her multiple health conditions.   ATTESTASTION STATEMENTS:  Reviewed by clinician on day of visit: allergies, medications, problem list, medical history, surgical history, family history, social history, and previous encounter notes pertinent to obesity diagnosis.   I have personally spent 30 minutes total time today in preparation, patient care, nutritional counseling and documentation for this visit, including the following: review of clinical lab tests; review of medical tests/procedures/services.      Glennis Brink, DO DABFM, DABOM [facility] [address] [place], [place] [address] [phone]

## 2023-06-06 NOTE — Assessment & Plan Note (Signed)
Doing well on metformin 500 mg for IR and PDM with hyperinsulinemia. She is working on reducing her intake of added sugar and starches and has room for improvement with exercise.  Consider increasing metformin dose. Repeat labs in Jan

## 2023-06-08 ENCOUNTER — Other Ambulatory Visit (HOSPITAL_COMMUNITY): Payer: Self-pay

## 2023-06-11 ENCOUNTER — Other Ambulatory Visit: Payer: Self-pay | Admitting: Medical-Surgical

## 2023-06-11 DIAGNOSIS — M170 Bilateral primary osteoarthritis of knee: Secondary | ICD-10-CM

## 2023-06-12 ENCOUNTER — Other Ambulatory Visit: Payer: Self-pay

## 2023-06-13 ENCOUNTER — Other Ambulatory Visit (HOSPITAL_COMMUNITY): Payer: Self-pay

## 2023-06-13 MED ORDER — METHOCARBAMOL 500 MG PO TABS
500.0000 mg | ORAL_TABLET | Freq: Three times a day (TID) | ORAL | 0 refills | Status: DC
  Filled 2023-06-13: qty 90, 30d supply, fill #0

## 2023-06-13 MED ORDER — CELECOXIB 100 MG PO CAPS
100.0000 mg | ORAL_CAPSULE | Freq: Every day | ORAL | 0 refills | Status: DC
  Filled 2023-06-13: qty 90, 90d supply, fill #0

## 2023-06-14 ENCOUNTER — Other Ambulatory Visit: Payer: Self-pay

## 2023-06-19 ENCOUNTER — Encounter: Payer: Self-pay | Admitting: Medical-Surgical

## 2023-06-20 ENCOUNTER — Telehealth (INDEPENDENT_AMBULATORY_CARE_PROVIDER_SITE_OTHER): Payer: 59 | Admitting: Medical-Surgical

## 2023-06-20 ENCOUNTER — Encounter: Payer: Self-pay | Admitting: Medical-Surgical

## 2023-06-20 DIAGNOSIS — G4733 Obstructive sleep apnea (adult) (pediatric): Secondary | ICD-10-CM | POA: Diagnosis not present

## 2023-06-20 MED ORDER — AMBULATORY NON FORMULARY MEDICATION: 0 refills | Status: AC

## 2023-06-20 NOTE — Progress Notes (Signed)
Virtual Visit via Video Note  I connected with Amy Diaz on 06/20/23 at 11:10 AM EST by a video enabled telemedicine application and verified that I am speaking with the correct person using two identifiers.   I discussed the limitations of evaluation and management by telemedicine and the availability of in person appointments. The patient expressed understanding and agreed to proceed.  Patient location: home Provider locations: office  Subjective:    CC: Discuss sleep study results  HPI: Very pleasant 52 year old female presenting today via MyChart video visit to discuss sleep study results at my request.  Prior history of loud snoring with morbid obesity and daytime fatigue.  Sleep study results reviewed showing mild-moderate obstructive sleep apnea with hypoxia (sats as low as 75%).  Past medical history, Surgical history, Family history not pertinant except as noted below, Social history, Allergies, and medications have been entered into the medical record, reviewed, and corrections made.   Review of Systems: See HPI for pertinent positives and negatives.   Objective:    General: Speaking clearly in complete sentences without any shortness of breath.  Alert and oriented x3.  Normal judgment. No apparent acute distress.  Impression and Recommendations:    1. OSA (obstructive sleep apnea) Reviewed sleep study results.  Discussed various options for treatment including continued efforts at weight loss, regular intentional exercise, avoidance of alcohol and other sedatives, oral appliances, elevated head of the bed, and CPAP.  Concern for hypoxia into the 70s during apneic episodes.  Advised to continue conservative measures as noted above but we will also go ahead with CPAP initiation.  Printed prescription will be sent off and she should let me know if she has not heard anything in the next couple of weeks regarding the CPAP machine.  I discussed the assessment and treatment plan  with the patient. The patient was provided an opportunity to ask questions and all were answered. The patient agreed with the plan and demonstrated an understanding of the instructions.   The patient was advised to call back or seek an in-person evaluation if the symptoms worsen or if the condition fails to improve as anticipated.  Return for Follow-up appointment as scheduled in January.  Thayer Ohm, DNP, APRN, FNP-BC [facility] and [facility] Medicine

## 2023-06-26 ENCOUNTER — Other Ambulatory Visit: Payer: Self-pay | Admitting: Medical-Surgical

## 2023-06-26 DIAGNOSIS — I10 Essential (primary) hypertension: Secondary | ICD-10-CM

## 2023-07-02 ENCOUNTER — Encounter: Payer: Self-pay | Admitting: Family Medicine

## 2023-07-02 ENCOUNTER — Other Ambulatory Visit (HOSPITAL_COMMUNITY): Payer: Self-pay

## 2023-07-02 ENCOUNTER — Ambulatory Visit (INDEPENDENT_AMBULATORY_CARE_PROVIDER_SITE_OTHER): Payer: 59 | Admitting: Family Medicine

## 2023-07-02 VITALS — BP 139/80 | HR 68 | Temp 98.0°F | Ht 63.0 in | Wt 249.0 lb

## 2023-07-02 DIAGNOSIS — I10 Essential (primary) hypertension: Secondary | ICD-10-CM | POA: Diagnosis not present

## 2023-07-02 DIAGNOSIS — G4733 Obstructive sleep apnea (adult) (pediatric): Secondary | ICD-10-CM | POA: Diagnosis not present

## 2023-07-02 DIAGNOSIS — R7303 Prediabetes: Secondary | ICD-10-CM | POA: Diagnosis not present

## 2023-07-02 DIAGNOSIS — E559 Vitamin D deficiency, unspecified: Secondary | ICD-10-CM

## 2023-07-02 DIAGNOSIS — Z6841 Body Mass Index (BMI) 40.0 and over, adult: Secondary | ICD-10-CM | POA: Diagnosis not present

## 2023-07-02 DIAGNOSIS — E88819 Insulin resistance, unspecified: Secondary | ICD-10-CM

## 2023-07-02 DIAGNOSIS — E66813 Obesity, class 3: Secondary | ICD-10-CM | POA: Diagnosis not present

## 2023-07-02 MED ORDER — METFORMIN HCL 500 MG PO TABS
500.0000 mg | ORAL_TABLET | Freq: Every day | ORAL | 0 refills | Status: DC
  Filled 2023-07-02: qty 30, 30d supply, fill #0

## 2023-07-02 MED ORDER — LOMAIRA 8 MG PO TABS: ORAL_TABLET | ORAL | 0 refills | Status: DC

## 2023-07-02 NOTE — Assessment & Plan Note (Addendum)
Lab Results  Component Value Date   HGBA1C 6.2 (H) 04/30/2023   She is tolerating metformin 500 mg once daily with food without adverse SE She has been consuming more starches and sweets lately Exercise is unchanged  Continue to work on prescribed diet Repeat labs in 1 mo

## 2023-07-02 NOTE — Progress Notes (Signed)
Office: [phone]  /  Fax: [phone]  WEIGHT SUMMARY AND BIOMETRICS  Starting Date: 04/30/23  Starting Weight: 252lb   Weight Lost Since Last Visit: 0lb   Vitals Temp: 98 F (36.7 C) BP: 139/80 Pulse Rate: 68 SpO2: 98 %   Body Composition  Body Fat %: 50.1 % Fat Mass (lbs): 125 lbs Muscle Mass (lbs): 118.4 lbs Total Body Water (lbs): 85.8 lbs Visceral Fat Rating : 17    HPI  Chief Complaint: OBESITY  Amy Diaz is here to discuss her progress with her obesity treatment plan. She is on the the Category 2 Plan and states she is following her eating plan approximately 40 % of the time. She states she is exercising 0 minutes 0 times per week.   Interval History:  Since last office visit she is up 3 lb Sugar cravings have been worse lately She has a net weight loss of 3 lb in the past 2 mos She was recently diagnosed with OSA, awaiting a CPAP She is craving more sweets late at night and first thing in the morning She has not ramped up exercise  Pharmacotherapy: metformin 500 mg daily for PDM, IR  PHYSICAL EXAM:  Blood pressure 139/80, pulse 68, temperature 98 F (36.7 C), height 5\' 3"  (1.6 m), weight 249 lb (112.9 kg), SpO2 98%. Body mass index is 44.11 kg/m.  General: She is overweight, cooperative, alert, well developed, and in no acute distress. PSYCH: Has normal mood, affect and thought process.   Lungs: Normal breathing effort, no conversational dyspnea.   ASSESSMENT AND PLAN  TREATMENT PLAN FOR OBESITY:  Recommended Dietary Goals  Aleine is currently in the action stage of change. As such, her goal is to continue weight management plan. She has agreed to the Category 2 Plan.  Behavioral Intervention  We discussed the following Behavioral Modification Strategies today: increasing lean protein intake to established goals, increasing vegetables, increasing lower glycemic fruits, increasing fiber rich foods, avoiding skipping meals, increasing water  intake , work on meal planning and preparation, work on managing stress, creating time for self-care and relaxation, continue to work on implementation of reduced calorie nutritional plan, planning for success, better snacking choices, and continue to work on maintaining a reduced calorie state, getting the recommended amount of protein, incorporating whole foods, making healthy choices, staying well hydrated and practicing mindfulness when eating..  Additional resources provided today: NA  Recommended Physical Activity Goals  Kailyn has been advised to work up to 150 minutes of moderate intensity aerobic activity a week and strengthening exercises 2-3 times per week for cardiovascular health, weight loss maintenance and preservation of muscle mass.   She has agreed to Think about enjoyable ways to increase daily physical activity and overcoming barriers to exercise and Increase physical activity in their day and reduce sedentary time (increase NEAT).  Pharmacotherapy changes for the treatment of obesity: begin Lomaira 8 mg bid PDMP reviewed Informed consent signed  ASSOCIATED CONDITIONS ADDRESSED TODAY  Prediabetes Assessment & Plan: Lab Results  Component Value Date   HGBA1C 6.2 (H) 04/30/2023   She is tolerating metformin 500 mg once daily with food without adverse SE She has been consuming more starches and sweets lately Exercise is unchanged  Continue to work on prescribed diet Repeat labs in 1 mo  Orders: -     metFORMIN HCl; Take 1 tablet (500 mg total) by mouth daily with breakfast.  Dispense: 30 tablet; Refill: 0  Class 3 severe obesity due to excess calories  with body mass index (BMI) of 40.0 to 44.9 in adult, unspecified whether serious comorbidity present (HCC) -     Lomaira; 1 tab po 30 min before breakfast and 1 tab po at 3 pm  Dispense: 60 tablet; Refill: 0  Insulin resistance -     metFORMIN HCl; Take 1 tablet (500 mg total) by mouth daily with breakfast.  Dispense:  30 tablet; Refill: 0  Essential hypertension Assessment & Plan: She did forget her BP meds yesterday but is on metoprolol succinate 25 mg daily and valsartan / hydrochlorothiazide 320/25 mg daily.  Denies HA or chest pain.  Continue current BP meds Watch for worsening BP while on Lomaira Continue active plan for weight reduction   Vitamin D deficiency Assessment & Plan: Last vitamin D Lab Results  Component Value Date   VD25OH 34.6 04/30/2023   She is taking OTC vitamin D 4,000 international units  daily Energy level is stable  Repeat lab late Jan   OSA on CPAP      She was informed of the importance of frequent follow up visits to maximize her success with intensive lifestyle modifications for her multiple health conditions.   ATTESTASTION STATEMENTS:  Reviewed by clinician on day of visit: allergies, medications, problem list, medical history, surgical history, family history, social history, and previous encounter notes pertinent to obesity diagnosis.   I have personally spent 30 minutes total time today in preparation, patient care, nutritional counseling and documentation for this visit, including the following: review of clinical lab tests; review of medical tests/procedures/services.      Glennis Brink, DO DABFM, DABOM [facility] [address] [place], [place] [address] [phone]

## 2023-07-02 NOTE — Assessment & Plan Note (Signed)
Last vitamin D Lab Results  Component Value Date   VD25OH 34.6 04/30/2023   She is taking OTC vitamin D 4,000 international units  daily Energy level is stable  Repeat lab late Jan

## 2023-07-02 NOTE — Assessment & Plan Note (Signed)
She did forget her BP meds yesterday but is on metoprolol succinate 25 mg daily and valsartan / hydrochlorothiazide 320/25 mg daily.  Denies HA or chest pain.  Continue current BP meds Watch for worsening BP while on Lomaira Continue active plan for weight reduction

## 2023-07-03 ENCOUNTER — Other Ambulatory Visit: Payer: Self-pay

## 2023-07-03 MED ORDER — CETIRIZINE HCL 10 MG PO TABS
10.0000 mg | ORAL_TABLET | Freq: Every day | ORAL | 1 refills | Status: DC
  Filled 2023-07-03: qty 90, 90d supply, fill #0
  Filled 2023-10-03: qty 90, 90d supply, fill #1

## 2023-07-03 MED ORDER — METOPROLOL SUCCINATE ER 25 MG PO TB24
25.0000 mg | ORAL_TABLET | Freq: Every day | ORAL | 0 refills | Status: DC
  Filled 2023-07-03: qty 90, 90d supply, fill #0

## 2023-07-03 MED ORDER — ASPIRIN 81 MG PO TBEC
81.0000 mg | DELAYED_RELEASE_TABLET | Freq: Every day | ORAL | 3 refills | Status: DC
  Filled 2023-07-03: qty 90, 90d supply, fill #0
  Filled 2023-09-14: qty 90, 90d supply, fill #1
  Filled 2023-09-16 – 2023-12-12 (×2): qty 90, 90d supply, fill #2
  Filled 2024-02-24 – 2024-04-28 (×2): qty 90, 90d supply, fill #3

## 2023-07-05 ENCOUNTER — Telehealth: Payer: Self-pay | Admitting: Medical-Surgical

## 2023-07-05 NOTE — Telephone Encounter (Signed)
CPAP orders,Sleep study report, clinical notes, demographics, and copies of insurance cards have been faxed to [facility] at [phone]. Office will contact patient to complete registration in order to ship home sleep study equipment.

## 2023-07-05 NOTE — Telephone Encounter (Signed)
-----   Message from [facility] Panya C sent at 06/21/2023  1:39 PM EST ----- Regarding: CPAP  ----- Message ----- From: Christen Butter, NP Sent: 06/20/2023   1:17 PM EST To: [facility] Clinical Pool  Please print the prescription for the CPAP machine and send it off to [facility] along with the office note, sleep study report, and insurance information if appropriate.

## 2023-07-06 ENCOUNTER — Encounter: Payer: Self-pay | Admitting: Medical-Surgical

## 2023-07-06 ENCOUNTER — Other Ambulatory Visit (HOSPITAL_COMMUNITY): Payer: Self-pay

## 2023-07-17 NOTE — Telephone Encounter (Signed)
FL2 form was faxed 07/17/2023

## 2023-07-20 DIAGNOSIS — G4733 Obstructive sleep apnea (adult) (pediatric): Secondary | ICD-10-CM | POA: Diagnosis not present

## 2023-07-26 ENCOUNTER — Encounter: Payer: Self-pay | Admitting: Family Medicine

## 2023-07-26 ENCOUNTER — Other Ambulatory Visit: Payer: Self-pay

## 2023-07-26 ENCOUNTER — Other Ambulatory Visit (HOSPITAL_COMMUNITY): Payer: Self-pay

## 2023-07-26 ENCOUNTER — Ambulatory Visit (INDEPENDENT_AMBULATORY_CARE_PROVIDER_SITE_OTHER): Payer: 59 | Admitting: Family Medicine

## 2023-07-26 VITALS — BP 125/81 | HR 64 | Temp 98.1°F | Ht 63.0 in | Wt 246.0 lb

## 2023-07-26 DIAGNOSIS — R7303 Prediabetes: Secondary | ICD-10-CM

## 2023-07-26 DIAGNOSIS — Z6841 Body Mass Index (BMI) 40.0 and over, adult: Secondary | ICD-10-CM

## 2023-07-26 DIAGNOSIS — E66813 Obesity, class 3: Secondary | ICD-10-CM | POA: Diagnosis not present

## 2023-07-26 DIAGNOSIS — E88819 Insulin resistance, unspecified: Secondary | ICD-10-CM

## 2023-07-26 DIAGNOSIS — R5383 Other fatigue: Secondary | ICD-10-CM | POA: Diagnosis not present

## 2023-07-26 DIAGNOSIS — G4733 Obstructive sleep apnea (adult) (pediatric): Secondary | ICD-10-CM | POA: Diagnosis not present

## 2023-07-26 DIAGNOSIS — I10 Essential (primary) hypertension: Secondary | ICD-10-CM

## 2023-07-26 MED ORDER — LOMAIRA 8 MG PO TABS
8.0000 mg | ORAL_TABLET | Freq: Two times a day (BID) | ORAL | 0 refills | Status: DC
  Filled 2023-07-26: qty 60, fill #0
  Filled 2023-08-16 (×3): qty 60, 30d supply, fill #0

## 2023-07-26 MED ORDER — METFORMIN HCL 500 MG PO TABS
500.0000 mg | ORAL_TABLET | Freq: Every day | ORAL | 0 refills | Status: DC
  Filled 2023-07-26 – 2023-08-16 (×3): qty 30, 30d supply, fill #0

## 2023-07-26 NOTE — Progress Notes (Signed)
Office: [phone]  /  Fax: [phone]  WEIGHT SUMMARY AND BIOMETRICS  Starting Date: 04/30/23  Starting Weight: 252lb   Weight Lost Since Last Visit: 3lb   Vitals Temp: 98.1 F (36.7 C) BP: 125/81 Pulse Rate: 64 SpO2: 99 %   Body Composition  Body Fat %: 49.1 % Fat Mass (lbs): 120.8 lbs Muscle Mass (lbs): 119 lbs Total Body Water (lbs): 84.6 lbs Visceral Fat Rating : 16   HPI  Chief Complaint: OBESITY  Amy Diaz is here to discuss her progress with her obesity treatment plan. She is on the the Category 2 Plan and states she is following her eating plan approximately 40 % of the time. She states she is exercising 0 minutes 0 times per week.  Interval History:  Since last office visit she is down 3 lb She has been stressed out.  Her mom is in the hospital and she is eating on the run She is averaging one meal per day She has a net weight loss of 6 lb in the past 12 weeks  She did gain 0.6 lb of muscle mass and lost 4.2 lb of body fat since last visit She is taking Lomaira only once a day in the morning and doesn't feel much different as far as her appetite She is snacking more at night but is usually skipping lunch and is lacking fruits and veggies She has an occasional regular soda  Pharmacotherapy: Lomaira 8 mg (taking once daily most days) without adverse SE  PHYSICAL EXAM:  Blood pressure 125/81, pulse 64, temperature 98.1 F (36.7 C), height 5\' 3"  (1.6 m), weight 246 lb (111.6 kg), SpO2 99%. Body mass index is 43.58 kg/m.  General: She is overweight, cooperative, alert, well developed, and in no acute distress. PSYCH: Has normal mood, affect and thought process.   Lungs: Normal breathing effort, no conversational dyspnea.   ASSESSMENT AND PLAN  TREATMENT PLAN FOR OBESITY:  Recommended Dietary Goals  Amy Diaz is currently in the action stage of change. As such, her goal is to continue weight management plan. She has agreed to the Category 2  Plan.  Behavioral Intervention  We discussed the following Behavioral Modification Strategies today: increasing lean protein intake to established goals, increasing vegetables, increasing fiber rich foods, increasing water intake , work on meal planning and preparation, keeping healthy foods at home, identifying sources and decreasing liquid calories, work on managing stress, creating time for self-care and relaxation, avoiding temptations and identifying enticing environmental cues, planning for success, and continue to work on maintaining a reduced calorie state, getting the recommended amount of protein, incorporating whole foods, making healthy choices, staying well hydrated and practicing mindfulness when eating..  Additional resources provided today: NA  Recommended Physical Activity Goals  Amy Diaz has been advised to work up to 150 minutes of moderate intensity aerobic activity a week and strengthening exercises 2-3 times per week for cardiovascular health, weight loss maintenance and preservation of muscle mass.   She has agreed to Think about enjoyable ways to increase daily physical activity and overcoming barriers to exercise and Increase physical activity in their day and reduce sedentary time (increase NEAT).  Pharmacotherapy changes for the treatment of obesity: none  ASSOCIATED CONDITIONS ADDRESSED TODAY  Prediabetes Assessment & Plan: Lab Results  Component Value Date   HGBA1C 6.2 (H) 04/30/2023   She is doing well on metformin 500 mg once daily with food without adverse SE She is starting to see weight loss  She has yet to  increase physical activity  Update labs today Continue cat 2 meal plan Think about exercise plan to start next month  Orders: -     metFORMIN HCl; Take 1 tablet (500 mg total) by mouth daily with breakfast.  Dispense: 30 tablet; Refill: 0 -     Basic metabolic panel  Insulin resistance -     metFORMIN HCl; Take 1 tablet (500 mg total) by mouth  daily with breakfast.  Dispense: 30 tablet; Refill: 0 -     Insulin, random  Class 3 severe obesity due to excess calories with body mass index (BMI) of 40.0 to 44.9 in adult, unspecified whether serious comorbidity present (HCC) -     Lomaira; 1 tab po 30 min before breakfast and 1 tab po at 3 pm  Dispense: 60 tablet; Refill: 0  Other fatigue -     Vitamin B12 -     Basic metabolic panel  OSA (obstructive sleep apnea) Assessment & Plan: She plans to start CPAP soon  Look for improved energy levels and weight reduction once she gets started with CPAP nightly Aim for 7-8 hrs of sleep at night   Essential hypertension Assessment & Plan: BP is at goal on Diovan HCT 320/ 25 mg daily, Toprol XL 25 mg daily without adverse SE She was having Headaches on the days that she did not take her BP meds Lomaira has not increased BP readings  Continue all prescribed anti hypertensive meds Continue active plan for weight loss       She was informed of the importance of frequent follow up visits to maximize her success with intensive lifestyle modifications for her multiple health conditions.   ATTESTASTION STATEMENTS:  Reviewed by clinician on day of visit: allergies, medications, problem list, medical history, surgical history, family history, social history, and previous encounter notes pertinent to obesity diagnosis.   I have personally spent 30 minutes total time today in preparation, patient care, nutritional counseling and documentation for this visit, including the following: review of clinical lab tests; review of medical tests/procedures/services.      Glennis Brink, DO DABFM, DABOM [facility] [address] [place], [place] [address] [phone]

## 2023-07-26 NOTE — Assessment & Plan Note (Signed)
BP is at goal on Diovan HCT 320/ 25 mg daily, Toprol XL 25 mg daily without adverse SE She was having Headaches on the days that she did not take her BP meds Lomaira has not increased BP readings  Continue all prescribed anti hypertensive meds Continue active plan for weight loss

## 2023-07-26 NOTE — Assessment & Plan Note (Signed)
She plans to start CPAP soon  Look for improved energy levels and weight reduction once she gets started with CPAP nightly Aim for 7-8 hrs of sleep at night

## 2023-07-26 NOTE — Assessment & Plan Note (Signed)
Lab Results  Component Value Date   HGBA1C 6.2 (H) 04/30/2023   She is doing well on metformin 500 mg once daily with food without adverse SE She is starting to see weight loss  She has yet to increase physical activity  Update labs today Continue cat 2 meal plan Think about exercise plan to start next month

## 2023-07-27 LAB — BASIC METABOLIC PANEL
BUN/Creatinine Ratio: 14 (ref 9–23)
BUN: 15 mg/dL (ref 6–24)
CO2: 25 mmol/L (ref 20–29)
Calcium: 10 mg/dL (ref 8.7–10.2)
Chloride: 103 mmol/L (ref 96–106)
Creatinine, Ser: 1.05 mg/dL — ABNORMAL HIGH (ref 0.57–1.00)
Glucose: 96 mg/dL (ref 70–99)
Potassium: 4.3 mmol/L (ref 3.5–5.2)
Sodium: 142 mmol/L (ref 134–144)
eGFR: 64 mL/min/{1.73_m2} (ref >59)

## 2023-07-27 LAB — VITAMIN B12: Vitamin B-12: 738 pg/mL (ref 232–1245)

## 2023-07-27 LAB — INSULIN, RANDOM: INSULIN: 39.8 u[IU]/mL — ABNORMAL HIGH (ref 2.6–24.9)

## 2023-08-13 ENCOUNTER — Telehealth: Payer: 59 | Admitting: Physician Assistant

## 2023-08-13 DIAGNOSIS — L02211 Cutaneous abscess of abdominal wall: Secondary | ICD-10-CM | POA: Diagnosis not present

## 2023-08-13 MED ORDER — DOXYCYCLINE HYCLATE 100 MG PO TABS: 100.0000 mg | ORAL_TABLET | Freq: Two times a day (BID) | ORAL | 0 refills | Status: DC

## 2023-08-13 NOTE — Patient Instructions (Signed)
 Amy Diaz, thank you for joining Delon CHRISTELLA Dickinson, PA-C for today's virtual visit.  While this provider is not your primary care provider (PCP), if your PCP is located in our provider database this encounter information will be shared with them immediately following your visit.   A [facility] MyChart account gives you access to today's visit and all your visits, tests, and labs performed at [facility]  click here if you don't have a [facility] MyChart account or go to [email or web address]  Consent: (Patient) Amy Diaz provided verbal consent for this virtual visit at the beginning of the encounter.  Current Medications:  Current Outpatient Medications:    doxycycline  (VIBRA -TABS) 100 MG tablet, Take 1 tablet (100 mg total) by mouth 2 (two) times daily., Disp: 20 tablet, Rfl: 0   AMBULATORY NON FORMULARY MEDICATION, Continuous positive airway pressure (CPAP) machine set on AutoPAP (4-20 cmH2O), with all supplemental supplies as needed., Disp: 1 each, Rfl: 0   aspirin  EC 81 MG tablet, Take 1 tablet (81 mg total) by mouth daily. Swallow whole., Disp: 90 tablet, Rfl: 3   atorvastatin  (LIPITOR) 10 MG tablet, Take 1 tablet (10 mg total) by mouth daily., Disp: 90 tablet, Rfl: 3   busPIRone  (BUSPAR ) 5 MG tablet, Take 1-3 tablets (5-15 mg total) by mouth 2 (two) times daily as needed., Disp: 90 tablet, Rfl: 3   busPIRone  (BUSPAR ) 5 MG tablet, Take 1-3 tablets (5-15 mg total) by mouth 2 (two) times daily as needed., Disp: 90 tablet, Rfl: 3   celecoxib  (CELEBREX ) 100 MG capsule, Take 1 capsule (100 mg total) by mouth daily., Disp: 90 capsule, Rfl: 0   cetirizine  (ZYRTEC ) 10 MG tablet, Take 1 tablet (10 mg total) by mouth daily., Disp: 90 tablet, Rfl: 1   cholecalciferol (VITAMIN D3) 25 MCG (1000 UNIT) tablet, Take 1,000 Units by mouth daily., Disp: , Rfl:    famotidine  (PEPCID ) 40 MG tablet, Take 1 tablet (40 mg total) by mouth at bedtime., Disp: 90 tablet, Rfl: 1    Fluocinolone  Acetonide Body 0.01 % OIL, Apply 1 application  topically daily., Disp: 118.28 mL, Rfl: 2   furosemide  (LASIX ) 20 MG tablet, Take 1 tablet (20 mg total) by mouth daily as needed., Disp: 30 tablet, Rfl: 1   Lidocaine -Menthol (NERVIVE ROLL-ON EX), , Disp: , Rfl:    metFORMIN  (GLUCOPHAGE ) 500 MG tablet, Take 1 tablet (500 mg total) by mouth daily with breakfast., Disp: 30 tablet, Rfl: 0   methocarbamol  (ROBAXIN ) 500 MG tablet, Take 1 tablet (500 mg total) by mouth 3 (three) times daily., Disp: 90 tablet, Rfl: 0   metoprolol  succinate (TOPROL -XL) 25 MG 24 hr tablet, Take 1 tablet (25 mg total) by mouth daily., Disp: 90 tablet, Rfl: 0   Multiple Vitamin (MULTIVITAMIN) tablet, Take 1 tablet by mouth daily., Disp: , Rfl:    omeprazole  (PRILOSEC) 40 MG capsule, Take 1 capsule (40 mg total) by mouth daily before supper., Disp: 90 capsule, Rfl: 0   Phentermine  HCl (LOMAIRA ) 8 MG TABS, Take 1 tablet (8 mg total) by mouth 2 (two) times daily (30 minutes before breakfast and at 3pm), Disp: 60 tablet, Rfl: 0   traZODone  (DESYREL ) 50 MG tablet, Take 0.5-1 tablets (25-50 mg total) by mouth at bedtime as needed for sleep., Disp: 90 tablet, Rfl: 1   traZODone  (DESYREL ) 50 MG tablet, Take 0.5-1 tablets (25-50 mg total) by mouth at bedtime as needed for sleep., Disp: 90 tablet, Rfl: 1   valsartan -hydrochlorothiazide  (DIOVAN -HCT) 320-25 MG tablet, TAKE  1 TABLET BY MOUTH EVERY DAY, Disp: 90 tablet, Rfl: 0   valsartan -hydrochlorothiazide  (DIOVAN -HCT) 320-25 MG tablet, Take 1 tablet by mouth daily., Disp: 90 tablet, Rfl: 0   Medications ordered in this encounter:  Meds ordered this encounter  Medications   doxycycline  (VIBRA -TABS) 100 MG tablet    Sig: Take 1 tablet (100 mg total) by mouth 2 (two) times daily.    Dispense:  20 tablet    Refill:  0    Supervising Provider:   LAMPTEY, PHILIP O [[id number]]     *If you need refills on other medications prior to your next appointment, please contact your  pharmacy*  Follow-Up: Call back or seek an in-person evaluation if the symptoms worsen or if the condition fails to improve as anticipated.  [facility] [phone]  Other Instructions  Skin Abscess  A skin abscess is an infected area on or under your skin. It contains pus and other material. An abscess may also be called a furuncle, carbuncle, or boil. It is often the result of an infection caused by bacteria. An abscess can occur in or on almost any part of your body. Sometimes, an abscess may break open (rupture) on its own. In most cases, it will keep getting worse unless it is treated. An abscess can cause pain and make you feel ill. An untreated abscess can cause infection to spread to other parts of your body or your bloodstream. The abscess may need to be drained. You may also need to take antibiotics. What are the causes? An abscess occurs when germs, like bacteria, pass through your skin and cause an infection. This may be caused by: A scrape or cut on your skin. A puncture wound through your skin, such as a needle injection or insect bite. Blocked oil or sweat glands. Blocked and infected hair follicles. A fluid-filled sac that forms beneath your skin (sebaceous cyst) and becomes infected. What increases the risk? You may be more likely to develop an abscess if: You have problems with blood circulation, or you have a weak body defense system (immune system). You have diabetes. You have dry and irritated skin. You get injections often or use IV drugs. You have a foreign body in a wound, such as a splinter. You smoke or use tobacco products. What are the signs or symptoms? Symptoms of this condition include: A painful, firm bump under the skin. A bump with pus at the top. This may break through the skin and drain. Other symptoms include: Redness and swelling around the abscess. Warmth or tenderness. Swelling of the lymph nodes (glands) near the abscess. A  sore on the skin. How is this diagnosed? This condition may be diagnosed based on a physical exam and your medical history. You may also have tests done, such as: A test of a sample of pus. This may be done to find what is causing the infection. Blood tests. Imaging tests, such as an ultrasound, CT scan, or MRI. How is this treated? A small abscess that drains on its own may not need to be treated. Treatment for larger abscesses may include: Moist heat or a heat pack applied to the area a few times a day. Incision and drainage. This is a procedure to drain the abscess. Antibiotics. For a severe abscess, you may first get antibiotics through an IV and then change to antibiotics by mouth. Follow these instructions at home: Medicines Take over-the-counter and prescription medicines only as told by your provider. If  you were prescribed antibiotics, take them as told by your provider. Do not stop using the antibiotic even if you start to feel better. Abscess care  If you have an abscess that has not drained, apply heat to the affected area. Use the heat source that your provider recommends, such as a moist heat pack or a heating pad. Place a towel between your skin and the heat source. Leave the heat on for 20-30 minutes at a time. If your skin turns bright red, remove the heat right away to prevent burns. The risk of burns is higher if you cannot feel pain, heat, or cold. Follow instructions from your provider about how to take care of your abscess. Make sure you: Cover the abscess with a bandage (dressing). Wash your hands with soap and water  for at least 20 seconds before and after you change the dressing or gauze. If soap and water  are not available, use hand sanitizer. Change your dressing or gauze as told by your provider. Check your abscess every day for signs of an infection that is getting worse. Check for: More redness, swelling, pain, or tenderness. More fluid or  blood. Warmth. More pus or a worse smell. General instructions To avoid spreading the infection: Do not share personal care items, towels, or hot tubs with others. Avoid making skin contact with other people. Be careful when getting rid of used dressings, wound packing, or any drainage from the abscess. Do not use any products that contain nicotine or tobacco. These products include cigarettes, chewing tobacco, and vaping devices, such as e-cigarettes. If you need help quitting, ask your provider. Do not use any creams, ointments, or liquids unless you have been told to by your provider. Contact a health care provider if: You see redness that spreads quickly or red streaks on your skin spreading away from the abscess. You have any signs of worse infection at the abscess. You vomit every time you eat or drink. You have a fever, chills, or muscle aches. The cyst or abscess returns. Get help right away if: You have severe pain. You make less pee (urine) than normal. This information is not intended to replace advice given to you by your health care provider. Make sure you discuss any questions you have with your health care provider. Document Revised: 03/01/2022 Document Reviewed: 03/01/2022 Elsevier Patient Education  2024 [employer]vier [employer]    If you have been instructed to have an in-person evaluation today at a local Urgent Care facility, please use the link below. It will take you to a list of all of our available [facility], including address, phone number and hours of operation. Please do not delay care.  [facility]  If you or a family member do not have a primary care provider, use the link below to schedule a visit and establish care. When you choose a [facility] primary care physician or advanced practice provider, you gain a long-term partner in health. Find a Primary Care Provider  Learn more about [facility]'s in-office and virtual care  options: [facility] - Get Care Now

## 2023-08-13 NOTE — Progress Notes (Signed)
 Virtual Visit Consent   Amy Diaz, you are scheduled for a virtual visit with a [place] provider today. Just as with appointments in the office, your consent must be obtained to participate. Your consent will be active for this visit and any virtual visit you may have with one of our providers in the next 365 days. If you have a MyChart account, a copy of this consent can be sent to you electronically.  As this is a virtual visit, video technology does not allow for your provider to perform a traditional examination. This may limit your provider's ability to fully assess your condition. If your provider identifies any concerns that need to be evaluated in person or the need to arrange testing (such as labs, EKG, etc.), we will make arrangements to do so. Although advances in technology are sophisticated, we cannot ensure that it will always work on either your end or our end. If the connection with a video visit is poor, the visit may have to be switched to a telephone visit. With either a video or telephone visit, we are not always able to ensure that we have a secure connection.  By engaging in this virtual visit, you consent to the provision of healthcare and authorize for your insurance to be billed (if applicable) for the services provided during this visit. Depending on your insurance coverage, you may receive a charge related to this service.  I need to obtain your verbal consent now. Are you willing to proceed with your visit today? Amy Diaz has provided verbal consent on 08/13/2023 for a virtual visit (video or telephone). Delon CHRISTELLA Dickinson, PA-C  Date: 08/13/2023 11:29 AM  Virtual Visit via Video Note   I, Delon CHRISTELLA Dickinson, connected with  Amy Diaz  ([id number], 08/21/1970) on 08/13/23 at 11:30 AM EST by a video-enabled telemedicine application and verified that I am speaking with the correct person using two identifiers.  Location: Patient: Virtual Visit Location Patient:  Home Provider: Virtual Visit Location Provider: Home Office   I discussed the limitations of evaluation and management by telemedicine and the availability of in person appointments. The patient expressed understanding and agreed to proceed.    History of Present Illness: Amy Diaz is a 53 y.o. who identifies as a female who was assigned female at birth, and is being seen today for possible abscess under the right breast.    In 2018 she did have a surgical resection of a cyst at [facility] in the same area due to recurrent abscesses. She has not had any issue until now.  Last week started to notice increased shooting pain. She looked and realized there was a small abscess over the area the previous cyst had been removed. This is located on the RUQ abdominal wall just below the right breast. She started applying boil-eaze and on Friday it started to spontaneously drain.   Now there is a small open wound with surrounding indurated skin. There is a purulent, foul smelling discharge.   She has been cleaning with soap and water . She is keeping a bandage over the area.  Denies fevers, chills, nausea, vomiting.  She is scheduled to see her PCP for routine follow up on 08/23/23.   Problems:  Patient Active Problem List   Diagnosis Date Noted   OSA (obstructive sleep apnea) 07/02/2023   Insulin  resistance 05/15/2023   Vitamin D  deficiency 05/15/2023   Other fatigue 04/30/2023   SOBOE (shortness of breath on exertion) 04/30/2023   Hyperlipidemia 04/30/2023  DDD (degenerative disc disease), lumbar 04/30/2023   Polyphagia 04/30/2023   Depression screen 04/30/2023   BMI 40.0-44.9, adult (HCC) 04/30/2023   Loud snoring 01/23/2023   Person encountering health services to consult on behalf of another person 05/11/2022   Bilateral hand pain 05/02/2022   Primary osteoarthritis of both knees 02/20/2022   Prediabetes 11/03/2019   Morbid obesity (HCC) 11/03/2019   Insomnia 11/03/2019   Fall  11/03/2019   Essential hypertension 11/03/2019    Allergies:  Allergies  Allergen Reactions   Lisinopril Cough and Other (See Comments)    cough   Sumatriptan Other (See Comments)    Causes chest tightness Chest tightness   Medications:  Current Outpatient Medications:    AMBULATORY NON FORMULARY MEDICATION, Continuous positive airway pressure (CPAP) machine set on AutoPAP (4-20 cmH2O), with all supplemental supplies as needed., Disp: 1 each, Rfl: 0   aspirin  EC 81 MG tablet, Take 1 tablet (81 mg total) by mouth daily. Swallow whole., Disp: 90 tablet, Rfl: 3   atorvastatin  (LIPITOR) 10 MG tablet, Take 1 tablet (10 mg total) by mouth daily., Disp: 90 tablet, Rfl: 3   busPIRone  (BUSPAR ) 5 MG tablet, Take 1-3 tablets (5-15 mg total) by mouth 2 (two) times daily as needed., Disp: 90 tablet, Rfl: 3   busPIRone  (BUSPAR ) 5 MG tablet, Take 1-3 tablets (5-15 mg total) by mouth 2 (two) times daily as needed., Disp: 90 tablet, Rfl: 3   celecoxib  (CELEBREX ) 100 MG capsule, Take 1 capsule (100 mg total) by mouth daily., Disp: 90 capsule, Rfl: 0   cetirizine  (ZYRTEC ) 10 MG tablet, Take 1 tablet (10 mg total) by mouth daily., Disp: 90 tablet, Rfl: 1   cholecalciferol (VITAMIN D3) 25 MCG (1000 UNIT) tablet, Take 1,000 Units by mouth daily., Disp: , Rfl:    famotidine  (PEPCID ) 40 MG tablet, Take 1 tablet (40 mg total) by mouth at bedtime., Disp: 90 tablet, Rfl: 1   Fluocinolone  Acetonide Body 0.01 % OIL, Apply 1 application  topically daily., Disp: 118.28 mL, Rfl: 2   furosemide  (LASIX ) 20 MG tablet, Take 1 tablet (20 mg total) by mouth daily as needed., Disp: 30 tablet, Rfl: 1   Lidocaine -Menthol (NERVIVE ROLL-ON EX), , Disp: , Rfl:    metFORMIN  (GLUCOPHAGE ) 500 MG tablet, Take 1 tablet (500 mg total) by mouth daily with breakfast., Disp: 30 tablet, Rfl: 0   methocarbamol  (ROBAXIN ) 500 MG tablet, Take 1 tablet (500 mg total) by mouth 3 (three) times daily., Disp: 90 tablet, Rfl: 0   metoprolol  succinate  (TOPROL -XL) 25 MG 24 hr tablet, Take 1 tablet (25 mg total) by mouth daily., Disp: 90 tablet, Rfl: 0   Multiple Vitamin (MULTIVITAMIN) tablet, Take 1 tablet by mouth daily., Disp: , Rfl:    omeprazole  (PRILOSEC) 40 MG capsule, Take 1 capsule (40 mg total) by mouth daily before supper., Disp: 90 capsule, Rfl: 0   Phentermine  HCl (LOMAIRA ) 8 MG TABS, Take 1 tablet (8 mg total) by mouth 2 (two) times daily (30 minutes before breakfast and at 3pm), Disp: 60 tablet, Rfl: 0   traZODone  (DESYREL ) 50 MG tablet, Take 0.5-1 tablets (25-50 mg total) by mouth at bedtime as needed for sleep., Disp: 90 tablet, Rfl: 1   traZODone  (DESYREL ) 50 MG tablet, Take 0.5-1 tablets (25-50 mg total) by mouth at bedtime as needed for sleep., Disp: 90 tablet, Rfl: 1   valsartan -hydrochlorothiazide  (DIOVAN -HCT) 320-25 MG tablet, TAKE 1 TABLET BY MOUTH EVERY DAY, Disp: 90 tablet, Rfl: 0   valsartan -hydrochlorothiazide  (  DIOVAN -HCT) 320-25 MG tablet, Take 1 tablet by mouth daily., Disp: 90 tablet, Rfl: 0  Observations/Objective: Patient is well-developed, well-nourished in no acute distress.  Resting comfortably at home.  Head is normocephalic, atraumatic.  No labored breathing.  Speech is clear and coherent with logical content.  Patient is alert and oriented at baseline.    Assessment and Plan: There are no diagnoses linked to this encounter. - Doxycycline  prescribed - Keep wound clean and dry - Wash with soap and water  twice daily - Cover to avoid irritation from clothes - Tylenol  as needed for pain - Follow up with PCP at scheduled visit or seek in person evaluation at local UC if symptoms worsen before scheduled appointment  Follow Up Instructions: I discussed the assessment and treatment plan with the patient. The patient was provided an opportunity to ask questions and all were answered. The patient agreed with the plan and demonstrated an understanding of the instructions.  A copy of instructions were sent to  the patient via MyChart unless otherwise noted below.    The patient was advised to call back or seek an in-person evaluation if the symptoms worsen or if the condition fails to improve as anticipated.    Delon CHRISTELLA Dickinson, PA-C

## 2023-08-16 ENCOUNTER — Other Ambulatory Visit (HOSPITAL_COMMUNITY): Payer: Self-pay

## 2023-08-16 ENCOUNTER — Other Ambulatory Visit: Payer: Self-pay | Admitting: Medical-Surgical

## 2023-08-16 ENCOUNTER — Other Ambulatory Visit: Payer: Self-pay

## 2023-08-17 ENCOUNTER — Other Ambulatory Visit (HOSPITAL_COMMUNITY): Payer: Self-pay

## 2023-08-17 ENCOUNTER — Other Ambulatory Visit: Payer: Self-pay

## 2023-08-17 MED ORDER — ATORVASTATIN CALCIUM 10 MG PO TABS
10.0000 mg | ORAL_TABLET | Freq: Every day | ORAL | 3 refills | Status: DC
  Filled 2023-08-17: qty 90, 90d supply, fill #0
  Filled 2023-09-16 – 2023-12-12 (×2): qty 90, 90d supply, fill #1
  Filled 2024-02-24: qty 90, 90d supply, fill #2
  Filled 2024-07-12 – 2024-07-14 (×2): qty 90, 90d supply, fill #3

## 2023-08-17 MED ORDER — METHOCARBAMOL 500 MG PO TABS
500.0000 mg | ORAL_TABLET | Freq: Three times a day (TID) | ORAL | 0 refills | Status: DC
  Filled 2023-08-17: qty 90, 30d supply, fill #0

## 2023-08-20 DIAGNOSIS — G4733 Obstructive sleep apnea (adult) (pediatric): Secondary | ICD-10-CM | POA: Diagnosis not present

## 2023-08-21 ENCOUNTER — Other Ambulatory Visit (HOSPITAL_COMMUNITY): Payer: Self-pay

## 2023-08-21 ENCOUNTER — Other Ambulatory Visit: Payer: Self-pay

## 2023-08-23 ENCOUNTER — Ambulatory Visit: Payer: 59 | Admitting: Medical-Surgical

## 2023-08-23 NOTE — Progress Notes (Deleted)
        Established patient visit  History, exam, impression, and plan:  No problem-specific Assessment & Plan notes found for this encounter.  ROS  Physical Exam   Procedures performed this visit: None.  No follow-ups on file.  __________________________________ Thayer Ohm, DNP, APRN, FNP-BC Primary Care and Sports Medicine [facility]

## 2023-08-24 ENCOUNTER — Ambulatory Visit (INDEPENDENT_AMBULATORY_CARE_PROVIDER_SITE_OTHER): Payer: 59 | Admitting: Medical-Surgical

## 2023-08-24 VITALS — BP 162/78 | HR 75 | Ht 63.0 in | Wt 253.0 lb

## 2023-08-24 DIAGNOSIS — L02211 Cutaneous abscess of abdominal wall: Secondary | ICD-10-CM | POA: Diagnosis not present

## 2023-08-24 DIAGNOSIS — G4733 Obstructive sleep apnea (adult) (pediatric): Secondary | ICD-10-CM

## 2023-08-24 DIAGNOSIS — E66813 Obesity, class 3: Secondary | ICD-10-CM

## 2023-08-24 DIAGNOSIS — Z6841 Body Mass Index (BMI) 40.0 and over, adult: Secondary | ICD-10-CM

## 2023-08-24 MED ORDER — ZEPBOUND 2.5 MG/0.5ML ~~LOC~~ SOAJ
2.5000 mg | SUBCUTANEOUS | 0 refills | Status: DC
  Filled 2023-08-24 – 2023-09-27 (×2): qty 2, 28d supply, fill #0

## 2023-08-24 NOTE — Progress Notes (Unsigned)
        Established patient visit  History, exam, impression, and plan:  1. Viral URI with cough (Primary) Pleasant 53 year old female presenting today with complaints of 2 days of sinus congestion with nonproductive cough, post tussive nausea, ear pressure, headache and generalized malaise. Feels like she can't take a deep breath.  Has been using Atrovent nasal spray which is helpful. Taking Ibuprofen 400mg  every 4 hours as needed. Lives in an assisted living facility with her husband who had the flu last week. POCT flu and covid negative today. Refilling Atrovent. Adding tessalon perls for daytime cough. Start promethazine-DM for night-time cough management. Getting chest x-ray today. Adding Tamiflu due to exposure and current symptoms.  - POCT Influenza A/B - POC COVID-19 - ipratropium (ATROVENT) 0.03 % nasal spray; Place 2 sprays into both nostrils every 12 (twelve) hours.  Dispense: 30 mL; Refill: 0 - benzonatate (TESSALON) 100 MG capsule; Take 1 capsule (100 mg total) by mouth 3 (three) times daily as needed for up to 7 days for cough.  Dispense: 21 capsule; Refill: 0 - promethazine-dextromethorphan (PROMETHAZINE-DM) 6.25-15 MG/5ML syrup; Take 5 mLs by mouth 4 (four) times daily as needed for cough.  Dispense: 118 mL; Refill: 0 - DG Chest 2 View; Future  Physical Exam Vitals reviewed.  Constitutional:      General: She is not in acute distress.    Appearance: Normal appearance. She is not ill-appearing.  HENT:     Head: Normocephalic and atraumatic.     Right Ear: Ear canal and external ear normal. There is no impacted cerumen.     Left Ear: Ear canal and external ear normal. There is no impacted cerumen.     Nose: Congestion and rhinorrhea present.     Mouth/Throat:     Mouth: Mucous membranes are moist.     Pharynx: Oropharyngeal exudate (clear) and posterior oropharyngeal erythema (mild) present.  Eyes:     General: No scleral icterus.       Right eye: No discharge.        Left  eye: No discharge.     Extraocular Movements: Extraocular movements intact.     Conjunctiva/sclera: Conjunctivae normal.     Pupils: Pupils are equal, round, and reactive to light.  Cardiovascular:     Rate and Rhythm: Normal rate and regular rhythm.     Pulses: Normal pulses.     Heart sounds: Normal heart sounds. No murmur heard.    No friction rub. No gallop.  Pulmonary:     Effort: Pulmonary effort is normal. No respiratory distress.     Breath sounds: Normal breath sounds. No wheezing.  Musculoskeletal:     Cervical back: Neck supple.  Lymphadenopathy:     Cervical: No cervical adenopathy.  Skin:    General: Skin is warm and dry.  Neurological:     Mental Status: She is alert and oriented to person, place, and time.  Psychiatric:        Mood and Affect: Mood normal.        Behavior: Behavior normal.        Thought Content: Thought content normal.        Judgment: Judgment normal.   Procedures performed this visit: None.  Return if symptoms worsen or fail to improve.  __________________________________ Thayer Ohm, DNP, APRN, FNP-BC Primary Care and Sports Medicine [facility] [place]

## 2023-08-25 ENCOUNTER — Encounter: Payer: Self-pay | Admitting: Medical-Surgical

## 2023-08-25 ENCOUNTER — Other Ambulatory Visit (HOSPITAL_COMMUNITY): Payer: Self-pay

## 2023-08-28 ENCOUNTER — Encounter: Payer: Self-pay | Admitting: Family Medicine

## 2023-08-28 ENCOUNTER — Other Ambulatory Visit (HOSPITAL_COMMUNITY): Payer: Self-pay

## 2023-08-28 ENCOUNTER — Other Ambulatory Visit: Payer: Self-pay

## 2023-08-28 ENCOUNTER — Ambulatory Visit (INDEPENDENT_AMBULATORY_CARE_PROVIDER_SITE_OTHER): Admitting: Family Medicine

## 2023-08-28 VITALS — BP 128/80 | HR 67 | Temp 98.0°F | Ht 63.0 in | Wt 245.0 lb

## 2023-08-28 DIAGNOSIS — Z6841 Body Mass Index (BMI) 40.0 and over, adult: Secondary | ICD-10-CM | POA: Diagnosis not present

## 2023-08-28 DIAGNOSIS — R7303 Prediabetes: Secondary | ICD-10-CM

## 2023-08-28 DIAGNOSIS — G4733 Obstructive sleep apnea (adult) (pediatric): Secondary | ICD-10-CM

## 2023-08-28 DIAGNOSIS — E88819 Insulin resistance, unspecified: Secondary | ICD-10-CM

## 2023-08-28 DIAGNOSIS — E66813 Obesity, class 3: Secondary | ICD-10-CM

## 2023-08-28 MED ORDER — LOMAIRA 8 MG PO TABS: 8.0000 mg | ORAL_TABLET | Freq: Two times a day (BID) | ORAL | 0 refills | Status: DC

## 2023-08-28 NOTE — Progress Notes (Signed)
Office: [phone]  /  Fax: [phone]  WEIGHT SUMMARY AND BIOMETRICS  Starting Date: 04/30/23  Starting Weight: 252lb   Weight Lost Since Last Visit: 1lb   Vitals Temp: 98 F (36.7 C) BP: 128/80 Pulse Rate: 67 SpO2: 98 %   Body Composition  Body Fat %: 48.3 % Fat Mass (lbs): 118.8 lbs Muscle Mass (lbs): 120.6 lbs Total Body Water (lbs): 82.8 lbs Visceral Fat Rating : 16    HPI  Chief Complaint: OBESITY  Amy Diaz is here to discuss her progress with her obesity treatment plan. She is on the the Category 2 Plan and states she is following her eating plan approximately 50 % of the time. She states she is exercising 60 minutes 7 times per week.   Interval History:  Since last office visit she is down 1 lb This gives her a net weight loss of 4 lb in 3 mos She is starting to gain muscle and lose body fat She has not yet started [place] yet Her PCP did write for her Zepbound RX (with OSA indication -- has not yet been approved) She is getting used to her CPAP at night She has been using a vibration plate at home for 20 min /day She has a treadmill now, doing 30 min of walking most days of the week  Pharmacotherapy: Lomaira 8 mg bid (has not started yet) + metformin 500 mg daily  PHYSICAL EXAM:  Blood pressure 128/80, pulse 67, temperature 98 F (36.7 C), height 5\' 3"  (1.6 m), weight 245 lb (111.1 kg), SpO2 98%. Body mass index is 43.4 kg/m.  General: She is overweight, cooperative, alert, well developed, and in no acute distress. PSYCH: Has normal mood, affect and thought process.   Lungs: Normal breathing effort, no conversational dyspnea.   ASSESSMENT AND PLAN  TREATMENT PLAN FOR OBESITY:  Recommended Dietary Goals  Amy Diaz is currently in the action stage of change. As such, her goal is to continue weight management plan. She has agreed to the Category 2 Plan and keeping a food journal and adhering to recommended goals of 1400 calories and 100 g of   protein. Can use Chat GPT for additional food plan ideas  Behavioral Intervention  We discussed the following Behavioral Modification Strategies today: increasing lean protein intake to established goals, increasing vegetables, increasing fiber rich foods, increasing water intake , work on meal planning and preparation, reading food labels , keeping healthy foods at home, identifying sources and decreasing liquid calories, work on managing stress, creating time for self-care and relaxation, continue to work on implementation of reduced calorie nutritional plan, continue to practice mindfulness when eating, and continue to work on maintaining a reduced calorie state, getting the recommended amount of protein, incorporating whole foods, making healthy choices, staying well hydrated and practicing mindfulness when eating.. Allow up to 2 servings of fresh or frozen fruit daily (has been having >2)  Additional resources provided today: NA  Recommended Physical Activity Goals  Amy Diaz has been advised to work up to 150 minutes of moderate intensity aerobic activity a week and strengthening exercises 2-3 times per week for cardiovascular health, weight loss maintenance and preservation of muscle mass.   She has agreed to Think about enjoyable ways to increase daily physical activity and overcoming barriers to exercise and Increase physical activity in their day and reduce sedentary time (increase NEAT). Continue walking 30 min 5 days/ wk  Pharmacotherapy changes for the treatment of obesity: move Lomaira 8 mg bid to alternative  pharmacy  ASSOCIATED CONDITIONS ADDRESSED TODAY  Prediabetes Assessment & Plan: Lab Results  Component Value Date   HGBA1C 6.2 (H) 04/30/2023   Doing well on metformin 500 mg once daily Labs done last visit show a normal B12 and chemistry panel Denies adverse SE from metformin Working on regular exercise and reducing sugar intake  Continue metformin 500 mg daily   Consider increasing dose next visit Continue active plan for weight reduction   Class 3 severe obesity due to excess calories with body mass index (BMI) of 40.0 to 44.9 in adult, unspecified whether serious comorbidity present (HCC) -     Lomaira; Take 1 tablet (8 mg total) by mouth 2 (two) times daily (30 minutes before breakfast and at 3pm)  Dispense: 60 tablet; Refill: 0  Insulin resistance Assessment & Plan: Stable Fasting insulin remains high with lifestyle changes (has some room for improvements) Has been more consistent with walking and exercise   Continue metformin with room to increase to bid next visit    OSA on CPAP Assessment & Plan: Adjusting to use of CPAP nightly averaging 4-5 hrs of sleep with it  She is actively working on weight loss Her PCP is working on a PA for [employer] with OSA indication  Continue active plan for a reduced kcal HPLC diet, regular exercise and weight loss Aim for increasing sleep time to 7-8 hrs with CPAP       She was informed of the importance of frequent follow up visits to maximize her success with intensive lifestyle modifications for her multiple health conditions.   ATTESTASTION STATEMENTS:  Reviewed by clinician on day of visit: allergies, medications, problem list, medical history, surgical history, family history, social history, and previous encounter notes pertinent to obesity diagnosis.   I have personally spent 30 minutes total time today in preparation, patient care, nutritional counseling and documentation for this visit, including the following: review of clinical lab tests; review of medical tests/procedures/services.      Glennis Brink, DO DABFM, DABOM [facility] [address] [place], [place] [address] [phone]

## 2023-08-28 NOTE — Assessment & Plan Note (Signed)
Stable Fasting insulin remains high with lifestyle changes (has some room for improvements) Has been more consistent with walking and exercise   Continue metformin with room to increase to bid next visit

## 2023-08-28 NOTE — Assessment & Plan Note (Signed)
Lab Results  Component Value Date   HGBA1C 6.2 (H) 04/30/2023   Doing well on metformin 500 mg once daily Labs done last visit show a normal B12 and chemistry panel Denies adverse SE from metformin Working on regular exercise and reducing sugar intake  Continue metformin 500 mg daily  Consider increasing dose next visit Continue active plan for weight reduction

## 2023-08-28 NOTE — Patient Instructions (Addendum)
LET ME KNOW IF YOU GET APPROVAL FOR ZEPBOUND  IN THE MEANTIME, FILL RX FOR LOMAIRA 8 MG TWICE DAILY AT [facility] USING THE [facility] ONLINE COUPON  CONTINUE METFORMIN 500 MG ONCE DAILY  CONTINUE USE OF CPAP NIGHTLY   Great job with workouts!!  Try Chat GPT for meal plan ideas  Allow 2 fresh or frozen fruit serving per day

## 2023-08-28 NOTE — Assessment & Plan Note (Signed)
Adjusting to use of CPAP nightly averaging 4-5 hrs of sleep with it  She is actively working on weight loss Her PCP is working on a PA for [employer] with OSA indication  Continue active plan for a reduced kcal HPLC diet, regular exercise and weight loss Aim for increasing sleep time to 7-8 hrs with CPAP

## 2023-08-29 ENCOUNTER — Other Ambulatory Visit: Payer: Self-pay

## 2023-08-29 DIAGNOSIS — L723 Sebaceous cyst: Secondary | ICD-10-CM | POA: Diagnosis not present

## 2023-09-03 NOTE — Patient Instructions (Addendum)
 SURGICAL WAITING ROOM VISITATION  Patients having surgery or a procedure may have no more than 2 support people in the waiting area - these visitors may rotate.    Children under the age of 33 must have an adult with them who is not the patient.  Due to an increase in RSV and influenza rates and associated hospitalizations, children ages 9 and under may not visit patients in [facility] hospitals.  Visitors with respiratory illnesses are discouraged from visiting and should remain at home.  If the patient needs to stay at the hospital during part of their recovery, the visitor guidelines for inpatient rooms apply. Pre-op nurse will coordinate an appropriate time for 1 support person to accompany patient in pre-op.  This support person may not rotate.    Please refer to the [facility] website for the visitor guidelines for Inpatients (after your surgery is over and you are in a regular room).       Your procedure is scheduled on: 09/25/23   Report to [facility] Main Entrance    Report to admitting at  12:45 PM   Call this number if you have problems the morning of surgery [phone]   Do not eat food or drink liquids :After Midnight.       Oral Hygiene is also important to reduce your risk of infection.                                    Remember - BRUSH YOUR TEETH THE MORNING OF SURGERY WITH YOUR REGULAR TOOTHPASTE   Stop all vitamins and herbal supplements 7 days before surgery.   Take these medicines the morning of surgery with A SIP OF WATER : Atorvastatin , buspar , cetirizine (Zyrtec ), famotidine (pepcid ), metoprolol , omeprazole (prilosec)             Do not take lasix  or diovan  the morning of surgery.  DO NOT TAKE ANY ORAL DIABETIC MEDICATIONS DAY OF YOUR SURGERY Hold Metformin  the morning of surgery. Hold tirzepatide  (zepbound ) for 7 days prior to surgery  Bring CPAP mask and tubing day of surgery.                              You may not have any metal on your  body including hair pins, jewelry, and body piercing             Do not wear make-up, lotions, powders, perfumes/cologne, or deodorant  Do not wear nail polish including gel and S&S, artificial/acrylic nails, or any other type of covering on natural nails including finger and toenails. If you have artificial nails, gel coating, etc. that needs to be removed by a nail salon please have this removed prior to surgery or surgery may need to be canceled/ delayed if the surgeon/ anesthesia feels like they are unable to be safely monitored.   Do not shave  48 hours prior to surgery.     Do not bring valuables to the hospital. [facility] IS NOT             RESPONSIBLE   FOR VALUABLES.   Contacts, glasses, dentures or bridgework may not be worn into surgery.   DO NOT BRING YOUR HOME MEDICATIONS TO THE HOSPITAL. PHARMACY WILL DISPENSE MEDICATIONS LISTED ON YOUR MEDICATION LIST TO YOU DURING YOUR ADMISSION IN THE HOSPITAL!    Patients discharged on the day  of surgery will not be allowed to drive home.  Someone NEEDS to stay with you for the first 24 hours after anesthesia.   Special Instructions: Bring a copy of your healthcare power of attorney and living will documents the day of surgery if you haven't scanned them before.              Please read over the following fact sheets you were given: IF YOU HAVE QUESTIONS ABOUT YOUR PRE-OP INSTRUCTIONS PLEASE CALL [phone] [place]   If you received a COVID test during your pre-op visit  it is requested that you wear a mask when out in public, stay away from anyone that may not be feeling well and notify your surgeon if you develop symptoms. If you test positive for Covid or have been in contact with anyone that has tested positive in the last 10 days please notify you surgeon.    [place] - Preparing for Surgery Before surgery, you can play an important role.  Because skin is not sterile, your skin needs to be as free of germs as possible.  You  can reduce the number of germs on your skin by washing with CHG (chlorahexidine gluconate) soap before surgery.  CHG is an antiseptic cleaner which kills germs and bonds with the skin to continue killing germs even after washing. Please DO NOT use if you have an allergy to CHG or antibacterial soaps.  If your skin becomes reddened/irritated stop using the CHG and inform your nurse when you arrive at Short Stay. Do not shave (including legs and underarms) for at least 48 hours prior to the first CHG shower.  You may shave your face/neck.  Please follow these instructions carefully:  1.  Shower with CHG Soap the night before surgery and the  morning of surgery.  2.  If you choose to wash your hair, wash your hair first as usual with your normal  shampoo.  3.  After you shampoo, rinse your hair and body thoroughly to remove the shampoo.                             4.  Use CHG as you would any other liquid soap.  You can apply chg directly to the skin and wash.  Gently with a scrungie or clean washcloth.  5.  Apply the CHG Soap to your body ONLY FROM THE NECK DOWN.   Do   not use on face/ open                           Wound or open sores. Avoid contact with eyes, ears mouth and   genitals (private parts).                       Wash face,  Genitals (private parts) with your normal soap.             6.  Wash thoroughly, paying special attention to the area where your    surgery  will be performed.  7.  Thoroughly rinse your body with warm water  from the neck down.  8.  DO NOT shower/wash with your normal soap after using and rinsing off the CHG Soap.                9.  Pat yourself dry with a clean towel.  10.  Wear clean pajamas.            11.  Place clean sheets on your bed the night of your first shower and do not  sleep with pets. Day of Surgery : Do not apply any lotions/deodorants the morning of surgery.  Please wear clean clothes to the hospital/surgery center.  FAILURE TO FOLLOW  THESE INSTRUCTIONS MAY RESULT IN THE CANCELLATION OF YOUR SURGERY  PATIENT SIGNATURE_________________________________  NURSE SIGNATURE__________________________________  ________________________________________________________________________

## 2023-09-03 NOTE — Progress Notes (Signed)
Please send pre op orders for PST appointment 09/06/23.

## 2023-09-03 NOTE — Progress Notes (Addendum)
 COVID Vaccine received:  []  No [x]  Yes Date of any COVID positive Test in last 90 days: no PCP - Zada Palin NP Cardiologist - n/a  Chest x-ray -  EKG -  04/30/23 Epic Stress Test -  ECHO -  Cardiac Cath -   Bowel Prep - [x]  No  []   Yes ______  Pacemaker / ICD device [x]  No []  Yes   Spinal Cord Stimulator:[x]  No []  Yes       History of Sleep Apnea? []  No [x]  Yes   CPAP used?- []  No [x]  Yes    Does the patient monitor blood sugar?          [x]  No []  Yes  []  N/A  Patient has: [x]  NO Hx DM   []  Pre-DM                 []  DM1  []   DM2 Does patient have a [employer] or [employer]? []  No []  Yes   Fasting Blood Sugar Ranges-  Checks Blood Sugar _____ times a day  GLP1 agonist / usual dose - no GLP1 instructions:  SGLT-2 inhibitors / usual dose - no SGLT-2 instructions:   Blood Thinner / Instructions:no Aspirin  Instructions:81mg  ASA daily .Will call office to see when to stop  Comments:   Activity level: Patient is able  to climb a flight of stairs without difficulty; [x]  No CP  [x]  No SOB,    Patient canperform ADLs without assistance.   Anesthesia review:   Patient denies shortness of breath, fever, cough and chest pain at PAT appointment.  Patient verbalized understanding and agreement to the Pre-Surgical Instructions that were given to them at this PAT appointment. Patient was also educated of the need to review these PAT instructions again prior to his/her surgery.I reviewed the appropriate phone numbers to call if they have any and questions or concerns.

## 2023-09-06 ENCOUNTER — Other Ambulatory Visit: Payer: Self-pay

## 2023-09-06 ENCOUNTER — Encounter (HOSPITAL_COMMUNITY)
Admission: RE | Admit: 2023-09-06 | Discharge: 2023-09-06 | Disposition: A | Discharge status: Home / Self Care | Payer: 59 | Source: Ambulatory Visit | Attending: Surgery | Admitting: Surgery

## 2023-09-06 ENCOUNTER — Encounter (HOSPITAL_COMMUNITY): Payer: Self-pay

## 2023-09-06 VITALS — BP 144/77 | HR 58 | Temp 98.3°F | Resp 16 | Ht 63.0 in | Wt 243.0 lb

## 2023-09-06 DIAGNOSIS — Z01812 Encounter for preprocedural laboratory examination: Secondary | ICD-10-CM | POA: Insufficient documentation

## 2023-09-06 DIAGNOSIS — I10 Essential (primary) hypertension: Secondary | ICD-10-CM | POA: Diagnosis not present

## 2023-09-06 HISTORY — DX: Cardiac arrhythmia, unspecified: I49.9

## 2023-09-06 LAB — CBC
HCT: 37.4 % (ref 36.0–46.0)
Hemoglobin: 12.4 g/dL (ref 12.0–15.0)
MCH: 30.2 pg (ref 26.0–34.0)
MCHC: 33.2 g/dL (ref 30.0–36.0)
MCV: 91.2 fL (ref 80.0–100.0)
Platelets: 373 10*3/uL (ref 150–400)
RBC: 4.1 MIL/uL (ref 3.87–5.11)
RDW: 13.6 % (ref 11.5–15.5)
WBC: 5.4 10*3/uL (ref 4.0–10.5)
nRBC: 0 % (ref 0.0–0.2)

## 2023-09-06 LAB — BASIC METABOLIC PANEL
Anion gap: 10 (ref 5–15)
BUN: 14 mg/dL (ref 6–20)
CO2: 27 mmol/L (ref 22–32)
Calcium: 10 mg/dL (ref 8.9–10.3)
Chloride: 105 mmol/L (ref 98–111)
Creatinine, Ser: 0.91 mg/dL (ref 0.44–1.00)
GFR, Estimated: >60 mL/min (ref >60)
Glucose, Bld: 90 mg/dL (ref 70–99)
Potassium: 4 mmol/L (ref 3.5–5.1)
Sodium: 142 mmol/L (ref 135–145)

## 2023-09-07 ENCOUNTER — Encounter: Payer: Self-pay | Admitting: Medical-Surgical

## 2023-09-10 NOTE — Telephone Encounter (Signed)
 Forwarding to joy Cleora Daft, NP and vanicia hernandez

## 2023-09-14 ENCOUNTER — Other Ambulatory Visit: Payer: Self-pay | Admitting: Medical-Surgical

## 2023-09-14 ENCOUNTER — Other Ambulatory Visit: Payer: Self-pay

## 2023-09-14 ENCOUNTER — Other Ambulatory Visit: Payer: Self-pay | Admitting: Family Medicine

## 2023-09-14 ENCOUNTER — Other Ambulatory Visit (INDEPENDENT_AMBULATORY_CARE_PROVIDER_SITE_OTHER): Payer: Self-pay | Admitting: Medical-Surgical

## 2023-09-14 ENCOUNTER — Other Ambulatory Visit (HOSPITAL_COMMUNITY): Payer: Self-pay

## 2023-09-14 DIAGNOSIS — M170 Bilateral primary osteoarthritis of knee: Secondary | ICD-10-CM

## 2023-09-14 DIAGNOSIS — E88819 Insulin resistance, unspecified: Secondary | ICD-10-CM

## 2023-09-14 DIAGNOSIS — R7303 Prediabetes: Secondary | ICD-10-CM

## 2023-09-16 ENCOUNTER — Other Ambulatory Visit: Payer: Self-pay | Admitting: Medical-Surgical

## 2023-09-16 DIAGNOSIS — I10 Essential (primary) hypertension: Secondary | ICD-10-CM

## 2023-09-17 ENCOUNTER — Other Ambulatory Visit: Payer: Self-pay

## 2023-09-17 ENCOUNTER — Other Ambulatory Visit (HOSPITAL_COMMUNITY): Payer: Self-pay

## 2023-09-17 MED ORDER — VALSARTAN-HYDROCHLOROTHIAZIDE 320-25 MG PO TABS
1.0000 | ORAL_TABLET | Freq: Every day | ORAL | 1 refills | Status: DC
  Filled 2023-09-17: qty 90, 90d supply, fill #0
  Filled 2023-12-12: qty 90, 90d supply, fill #1

## 2023-09-17 MED ORDER — CELECOXIB 100 MG PO CAPS
100.0000 mg | ORAL_CAPSULE | Freq: Every day | ORAL | 0 refills | Status: DC
  Filled 2023-09-17: qty 90, 90d supply, fill #0

## 2023-09-17 MED ORDER — OMEPRAZOLE 40 MG PO CPDR
40.0000 mg | DELAYED_RELEASE_CAPSULE | Freq: Every day | ORAL | 0 refills | Status: DC
  Filled 2023-09-17: qty 90, 90d supply, fill #0

## 2023-09-18 ENCOUNTER — Other Ambulatory Visit (HOSPITAL_COMMUNITY): Payer: Self-pay

## 2023-09-20 DIAGNOSIS — G4733 Obstructive sleep apnea (adult) (pediatric): Secondary | ICD-10-CM | POA: Diagnosis not present

## 2023-09-25 ENCOUNTER — Ambulatory Visit (HOSPITAL_COMMUNITY): Payer: 59 | Admitting: Anesthesiology

## 2023-09-25 ENCOUNTER — Ambulatory Visit (HOSPITAL_COMMUNITY)
Admission: RE | Admit: 2023-09-25 | Discharge: 2023-09-25 | Disposition: A | Discharge status: Home / Self Care | Payer: 59 | Source: Ambulatory Visit | Attending: Surgery | Admitting: Surgery

## 2023-09-25 ENCOUNTER — Encounter (HOSPITAL_COMMUNITY): Payer: Self-pay | Admitting: Surgery

## 2023-09-25 ENCOUNTER — Other Ambulatory Visit (HOSPITAL_COMMUNITY): Payer: Self-pay

## 2023-09-25 ENCOUNTER — Encounter (HOSPITAL_COMMUNITY)
Admission: RE | Disposition: A | Discharge status: Home / Self Care | Payer: Self-pay | Source: Ambulatory Visit | Attending: Surgery

## 2023-09-25 ENCOUNTER — Other Ambulatory Visit: Payer: Self-pay

## 2023-09-25 DIAGNOSIS — L723 Sebaceous cyst: Secondary | ICD-10-CM

## 2023-09-25 DIAGNOSIS — G4730 Sleep apnea, unspecified: Secondary | ICD-10-CM | POA: Diagnosis not present

## 2023-09-25 DIAGNOSIS — Z79899 Other long term (current) drug therapy: Secondary | ICD-10-CM | POA: Insufficient documentation

## 2023-09-25 DIAGNOSIS — E785 Hyperlipidemia, unspecified: Secondary | ICD-10-CM

## 2023-09-25 DIAGNOSIS — I10 Essential (primary) hypertension: Secondary | ICD-10-CM

## 2023-09-25 DIAGNOSIS — M1990 Unspecified osteoarthritis, unspecified site: Secondary | ICD-10-CM | POA: Diagnosis not present

## 2023-09-25 DIAGNOSIS — E66813 Obesity, class 3: Secondary | ICD-10-CM | POA: Insufficient documentation

## 2023-09-25 DIAGNOSIS — Z6841 Body Mass Index (BMI) 40.0 and over, adult: Secondary | ICD-10-CM | POA: Insufficient documentation

## 2023-09-25 DIAGNOSIS — K219 Gastro-esophageal reflux disease without esophagitis: Secondary | ICD-10-CM | POA: Insufficient documentation

## 2023-09-25 DIAGNOSIS — G4733 Obstructive sleep apnea (adult) (pediatric): Secondary | ICD-10-CM | POA: Diagnosis not present

## 2023-09-25 HISTORY — PX: MASS EXCISION: SHX2000

## 2023-09-25 LAB — GLUCOSE, CAPILLARY: Glucose-Capillary: 69 mg/dL — ABNORMAL LOW (ref 70–99)

## 2023-09-25 SURGERY — EXCISION MASS
Anesthesia: Monitor Anesthesia Care | Site: Chest | Laterality: Right

## 2023-09-25 MED ORDER — KETAMINE HCL 50 MG/5ML IJ SOSY
PREFILLED_SYRINGE | INTRAMUSCULAR | Status: AC
  Filled 2023-09-25: qty 5

## 2023-09-25 MED ORDER — ONDANSETRON HCL 4 MG/2ML IJ SOLN
INTRAMUSCULAR | Status: DC | PRN
  Administered 2023-09-25: 4 mg via INTRAVENOUS

## 2023-09-25 MED ORDER — FENTANYL CITRATE (PF) 100 MCG/2ML IJ SOLN
INTRAMUSCULAR | Status: AC
  Filled 2023-09-25: qty 2

## 2023-09-25 MED ORDER — PROPOFOL 1000 MG/100ML IV EMUL
INTRAVENOUS | Status: AC
  Filled 2023-09-25: qty 100

## 2023-09-25 MED ORDER — LACTATED RINGERS IV SOLN: INTRAVENOUS | Status: DC

## 2023-09-25 MED ORDER — BUPIVACAINE-EPINEPHRINE (PF) 0.25% -1:200000 IJ SOLN
INTRAMUSCULAR | Status: AC
  Filled 2023-09-25: qty 30

## 2023-09-25 MED ORDER — CHLORHEXIDINE GLUCONATE CLOTH 2 % EX PADS: 6.0000 | MEDICATED_PAD | Freq: Once | CUTANEOUS | Status: DC

## 2023-09-25 MED ORDER — LIDOCAINE HCL (CARDIAC) PF 100 MG/5ML IV SOSY
PREFILLED_SYRINGE | INTRAVENOUS | Status: DC | PRN
  Administered 2023-09-25: 100 mg via INTRATRACHEAL

## 2023-09-25 MED ORDER — KETAMINE HCL 50 MG/5ML IJ SOSY
PREFILLED_SYRINGE | INTRAMUSCULAR | Status: DC | PRN
  Administered 2023-09-25: 20 mg via INTRAVENOUS
  Administered 2023-09-25: 30 mg via INTRAVENOUS

## 2023-09-25 MED ORDER — MIDAZOLAM HCL 2 MG/2ML IJ SOLN
INTRAMUSCULAR | Status: AC
  Filled 2023-09-25: qty 2

## 2023-09-25 MED ORDER — BUPIVACAINE-EPINEPHRINE 0.25% -1:200000 IJ SOLN
INTRAMUSCULAR | Status: DC | PRN
  Administered 2023-09-25: 20 mL

## 2023-09-25 MED ORDER — OXYCODONE-ACETAMINOPHEN 5-325 MG PO TABS
1.0000 | ORAL_TABLET | ORAL | 0 refills | Status: DC | PRN
  Filled 2023-09-25 (×2): qty 10, 2d supply, fill #0

## 2023-09-25 MED ORDER — DEXAMETHASONE SODIUM PHOSPHATE 10 MG/ML IJ SOLN
INTRAMUSCULAR | Status: AC
Start: 2023-09-25 — End: ?
  Filled 2023-09-25: qty 1

## 2023-09-25 MED ORDER — CEFAZOLIN SODIUM-DEXTROSE 2-4 GM/100ML-% IV SOLN
2.0000 g | INTRAVENOUS | Status: AC
  Administered 2023-09-25: 3 mg via INTRAVENOUS
  Filled 2023-09-25: qty 100

## 2023-09-25 MED ORDER — SODIUM CHLORIDE 0.9 % IV SOLN: INTRAVENOUS | Status: DC | PRN

## 2023-09-25 MED ORDER — GLYCOPYRROLATE 0.2 MG/ML IJ SOLN
INTRAMUSCULAR | Status: AC
  Filled 2023-09-25: qty 1

## 2023-09-25 MED ORDER — PROPOFOL 500 MG/50ML IV EMUL
INTRAVENOUS | Status: DC | PRN
  Administered 2023-09-25: 80 ug/kg/min via INTRAVENOUS

## 2023-09-25 MED ORDER — ACETAMINOPHEN 500 MG PO TABS
1000.0000 mg | ORAL_TABLET | ORAL | Status: AC
  Administered 2023-09-25: 1000 mg via ORAL
  Filled 2023-09-25: qty 2

## 2023-09-25 MED ORDER — FENTANYL CITRATE PF 50 MCG/ML IJ SOSY: 25.0000 ug | PREFILLED_SYRINGE | INTRAMUSCULAR | Status: DC | PRN

## 2023-09-25 MED ORDER — MIDAZOLAM HCL 2 MG/2ML IJ SOLN
INTRAMUSCULAR | Status: DC | PRN
  Administered 2023-09-25: 2 mg via INTRAVENOUS

## 2023-09-25 MED ORDER — GLYCOPYRROLATE 0.2 MG/ML IJ SOLN
INTRAMUSCULAR | Status: DC | PRN
  Administered 2023-09-25 (×2): .2 mg via INTRAVENOUS

## 2023-09-25 MED ORDER — 0.9 % SODIUM CHLORIDE (POUR BTL) OPTIME
TOPICAL | Status: DC | PRN
  Administered 2023-09-25: 1000 mL

## 2023-09-25 MED ORDER — DEXAMETHASONE SODIUM PHOSPHATE 10 MG/ML IJ SOLN
INTRAMUSCULAR | Status: DC | PRN
  Administered 2023-09-25: 8 mg via INTRAVENOUS

## 2023-09-25 MED ORDER — ORAL CARE MOUTH RINSE: 15.0000 mL | Freq: Once | OROMUCOSAL | Status: AC

## 2023-09-25 MED ORDER — CHLORHEXIDINE GLUCONATE 0.12 % MT SOLN
15.0000 mL | Freq: Once | OROMUCOSAL | Status: AC
  Administered 2023-09-25: 15 mL via OROMUCOSAL

## 2023-09-25 MED ORDER — LIDOCAINE HCL (PF) 2 % IJ SOLN
INTRAMUSCULAR | Status: AC
  Filled 2023-09-25: qty 5

## 2023-09-25 MED ORDER — PHENYLEPHRINE 80 MCG/ML (10ML) SYRINGE FOR IV PUSH (FOR BLOOD PRESSURE SUPPORT)
PREFILLED_SYRINGE | INTRAVENOUS | Status: AC
  Filled 2023-09-25: qty 10

## 2023-09-25 MED ORDER — DEXMEDETOMIDINE HCL IN NACL 80 MCG/20ML IV SOLN
INTRAVENOUS | Status: AC
  Filled 2023-09-25: qty 20

## 2023-09-25 MED ORDER — PROPOFOL 10 MG/ML IV BOLUS
INTRAVENOUS | Status: DC | PRN
  Administered 2023-09-25 (×2): 50 mg via INTRAVENOUS

## 2023-09-25 MED ORDER — AMISULPRIDE (ANTIEMETIC) 5 MG/2ML IV SOLN: 10.0000 mg | Freq: Once | INTRAVENOUS | Status: DC | PRN

## 2023-09-25 MED ORDER — ONDANSETRON HCL 4 MG/2ML IJ SOLN
INTRAMUSCULAR | Status: AC
  Filled 2023-09-25: qty 2

## 2023-09-25 SURGICAL SUPPLY — 24 items
BAG COUNTER SPONGE SURGICOUNT (BAG) IMPLANT
BLADE SURG 15 STRL LF DISP TIS (BLADE) ×1 IMPLANT
CHLORAPREP W/TINT 26 (MISCELLANEOUS) ×1 IMPLANT
COVER SURGICAL LIGHT HANDLE (MISCELLANEOUS) ×1 IMPLANT
DERMABOND ADVANCED .7 DNX12 (GAUZE/BANDAGES/DRESSINGS) ×1 IMPLANT
DRAPE LAPAROSCOPIC ABDOMINAL (DRAPES) ×1 IMPLANT
ELECT REM PT RETURN 15FT ADLT (MISCELLANEOUS) ×1 IMPLANT
GAUZE 4X4 16PLY ~~LOC~~+RFID DBL (SPONGE) IMPLANT
GAUZE SPONGE 4X4 12PLY STRL (GAUZE/BANDAGES/DRESSINGS) ×1 IMPLANT
GLOVE BIO SURGEON STRL SZ7.5 (GLOVE) ×1 IMPLANT
GLOVE INDICATOR 8.0 STRL GRN (GLOVE) ×1 IMPLANT
GOWN SPEC L4 XLG W/TWL (GOWN DISPOSABLE) ×1 IMPLANT
GOWN STRL REUS W/ TWL XL LVL3 (GOWN DISPOSABLE) ×1 IMPLANT
KIT BASIN OR (CUSTOM PROCEDURE TRAY) ×1 IMPLANT
KIT TURNOVER KIT A (KITS) IMPLANT
NS IRRIG 1000ML POUR BTL (IV SOLUTION) ×1 IMPLANT
PACK BASIC VI WITH GOWN DISP (CUSTOM PROCEDURE TRAY) ×1 IMPLANT
PENCIL SMOKE EVACUATOR (MISCELLANEOUS) ×1 IMPLANT
SPIKE FLUID TRANSFER (MISCELLANEOUS) ×1 IMPLANT
SPONGE T-LAP 18X18 ~~LOC~~+RFID (SPONGE) IMPLANT
SUT MNCRL AB 4-0 PS2 18 (SUTURE) ×1 IMPLANT
SUT VIC AB 3-0 SH 27XBRD (SUTURE) ×1 IMPLANT
SYR BULB IRRIG 60ML STRL (SYRINGE) ×1 IMPLANT
SYR CONTROL 10ML LL (SYRINGE) ×1 IMPLANT

## 2023-09-25 NOTE — Op Note (Signed)
   Patient: Amy Diaz (12-27-1970, [id number])  Date of Surgery: 09/25/2023  Preoperative Diagnosis: RIGHT CHEST SEBACEOUS CYST   Postoperative Diagnosis: RIGHT CHEST SEBACEOUS CYST   Surgical Procedure: EXCISION RIGHT CHEST SEBACEOUS CYST:    Operative Team Members:  Surgeons and Role:    * Akia Desroches, Hyman Hopes, MD - Primary   Anesthesiologist: Marcene Duos, MD CRNA: Micki Riley, CRNA   Anesthesia: Monitor Anesthesia Care   Fluids:  No intake/output data recorded.  Complications: None  Drains:  none   Specimen: None  Disposition:  PACU - hemodynamically stable.  Plan of Care: Discharge to home after PACU    Indications for Procedure: [name] is a 53 y.o. female who presented with a recurrent sebaceous cyst.  I recommended open excision of sebaceous cyst.  The procedure itself as well as its risks, benefits and alternatives were discussed.  The risks discussed included but were not limited to the risk of infection, bleeding, damage to nearby structures, and wound infection.  After a full discussion and all questions answered the patient granted consent to proceed.  Findings: 2cm x 2cm x 3 cm specimen including sebaceous cyst, previous scar, and surrounding margin of normal tissue.   Description of Procedure:   On the day stable the patient taken operating suite.  Anesthesia was induced.  Antibiotics were in part the case start.  A timeout was completed verifying the correct patient, procedure, position, and equipment of the case.  I made an elliptical incision around the previous scar and sebaceous cyst on the right chest wall.  I removed the cyst, the scar, and a margin of normal subcutaneous tissue.  This was not passed off to pathology as a specimen as it was clearly a sebaceous cyst.  The wound was irrigated.  Hemostasis was ensured using electrocautery.  The wound was closed using 2-0 Vicryl for the deep dermal layer and 4-0 Monocryl for the skin.   Dermabond was applied.  All sponge needle counts were correct at the end of this case.  At the end of the case we reviewed the infection status of the case. Patient: Private Patient Elective Case Case: Elective Infection Present At Time Of Surgery (PATOS):  Sebaceous cyst   Ivar Drape, MD General, Bariatric, & Minimally Invasive Surgery [facility], [place]

## 2023-09-25 NOTE — H&P (Signed)
 Admitting Physician: Hyman Hopes Dela Sweeny  Service: General Surgery  CC: Sebaceous cyst  Subjective   HPI: Amy Diaz is an 53 y.o. female who is here for sebaceous cyst excision  Past Medical History:  Diagnosis Date   Abnormal mammogram    Allergy    on zyrtec- seasonal   Arthritis    back   Back pain    Back pain    Dysrhythmia    Edema    Enthesopathy    GERD (gastroesophageal reflux disease)    Glaucoma    no drops so far early onset per pt   H/O degenerative disc disease    Heartburn    High cholesterol    Hypertension    Joint pain    Prediabetes    Sleep apnea    no OSA per pt per 2 sleep studies   Swelling     Past Surgical History:  Procedure Laterality Date   CARPAL TUNNEL RELEASE Right    CESAREAN SECTION     x1   CYST REMOVAL TRUNK     ECTOPIC PREGNANCY SURGERY     TOTAL ABDOMINAL HYSTERECTOMY     WISDOM TOOTH EXTRACTION      Family History  Problem Relation Age of Onset   Hyperlipidemia Mother    Thyroid disease Mother    Hypertension Mother    Hyperlipidemia Father    Cancer Father    Hypertension Father    Prostate cancer Father    Anxiety disorder Father    Colon polyps Maternal Aunt    Colon polyps Maternal Aunt    Colon polyps Cousin    Colon polyps Cousin    Colon cancer Neg Hx    Esophageal cancer Neg Hx    Rectal cancer Neg Hx    Stomach cancer Neg Hx     Social:  reports that she has never smoked. She has never used smokeless tobacco. She reports that she does not currently use alcohol. She reports that she does not use drugs.  Allergies:  Allergies  Allergen Reactions   Lisinopril Cough   Sumatriptan Other (See Comments)    chest tightness    Medications: Current Outpatient Medications  Medication Instructions   AMBULATORY NON FORMULARY MEDICATION Continuous positive airway pressure (CPAP) machine set on AutoPAP (4-20 cmH2O), with all supplemental supplies as needed.   Aspirin Low Dose 81 mg, Oral, Daily,  Swallow whole.   atorvastatin (LIPITOR) 10 mg, Oral, Daily   Brimonidine Tartrate (LUMIFY) 0.025 % SOLN 1 drop, Every morning   busPIRone (BUSPAR) 5-15 mg, Oral, 2 times daily PRN   celecoxib (CELEBREX) 100 mg, Oral, Daily   cetirizine (ZYRTEC) 10 mg, Oral, Daily   doxycycline (VIBRA-TABS) 100 mg, Oral, 2 times daily   famotidine (PEPCID) 40 mg, Oral, Daily at bedtime   Fluocinolone Acetonide Body 0.01 % OIL 1 application , Apply externally, Daily   furosemide (LASIX) 20 mg, Oral, Daily PRN   metFORMIN (GLUCOPHAGE) 500 mg, Oral, Daily with breakfast   methocarbamol (ROBAXIN) 500 mg, Oral, 3 times daily   metoprolol succinate (TOPROL-XL) 25 mg, Oral, Daily   Multiple Vitamin (MULTIVITAMIN) tablet 1 tablet, Oral, Every morning   omeprazole (PRILOSEC) 40 mg, Oral, Daily before supper   Phentermine HCl (LOMAIRA) 8 MG TABS Take 1 tablet (8 mg total) by mouth 2 (two) times daily (30 minutes before breakfast and at 3pm)   Propylene Glycol (SYSTANE COMPLETE) 0.6 % SOLN 1 drop, Daily at bedtime   traZODone (DESYREL)  25-50 mg, Oral, At bedtime PRN   valsartan-hydrochlorothiazide (DIOVAN-HCT) 320-25 MG tablet 1 tablet, Oral, Daily   VITAMIN D PO 4,000 Units, Oral, Every morning   Zepbound 2.5 mg, Subcutaneous, Weekly    ROS - all of the below systems have been reviewed with the patient and positives are indicated with bold text General: chills, fever or night sweats Eyes: blurry vision or double vision ENT: epistaxis or sore throat Allergy/Immunology: itchy/watery eyes or nasal congestion Hematologic/Lymphatic: bleeding problems, blood clots or swollen lymph nodes Endocrine: temperature intolerance or unexpected weight changes Breast: new or changing breast lumps or nipple discharge Resp: cough, shortness of breath, or wheezing CV: chest pain or dyspnea on exertion GI: as per HPI GU: dysuria, trouble voiding, or hematuria MSK: joint pain or joint stiffness Neuro: TIA or stroke  symptoms Derm: pruritus and skin lesion changes Psych: anxiety and depression  Objective   PE Blood pressure (!) 174/78, pulse 63, temperature 98.5 F (36.9 C), temperature source Oral, resp. rate 16, height 5\' 3"  (1.6 m), weight 110.2 kg, SpO2 100%. Constitutional: NAD; conversant; no deformities Eyes: Moist conjunctiva; no lid lag; anicteric; PERRL Neck: Trachea midline; no thyromegaly Lungs: Normal respiratory effort; no tactile fremitus CV: RRR; no palpable thrills; no pitting edema GI: Abd right chest wall sebaceous cyst; no palpable hepatosplenomegaly MSK: Normal range of motion of extremities; no clubbing/cyanosis Psychiatric: Appropriate affect; alert and oriented x3 Lymphatic: No palpable cervical or axillary lymphadenopathy  Results for orders placed or performed during the hospital encounter of 09/25/23 (from the past 24 hours)  Glucose, capillary     Status: Abnormal   Collection Time: 09/25/23  1:13 PM  Result Value Ref Range   Glucose-Capillary 69 (L) 70 - 99 mg/dL   Comment 1 Notify RN     Imaging Orders  No imaging studies ordered today     Assessment and Plan   Amy Diaz has a sebaceous cyst on the right chest wall. I recommended excision of 2cm x 2cm sebaceous cyst.  This cyst is in the site of the previous excision so I will plan to excise the scar. We discussed the procedure itself as well as its risk, benefits, and alternatives. After full discussion all questions answered the patient granted consent to proceed.     Quentin Ore, MD  [facility] Use [email or web address] to contact on call provider

## 2023-09-25 NOTE — Anesthesia Postprocedure Evaluation (Signed)
 Anesthesia Post Note  Patient: Amy Diaz  Procedure(s) Performed: EXCISION RIGHT CHEST SEBACEOUS CYST (Right: Chest)     Patient location during evaluation: PACU Anesthesia Type: MAC Level of consciousness: awake and alert Pain management: pain level controlled Vital Signs Assessment: post-procedure vital signs reviewed and stable Respiratory status: spontaneous breathing, nonlabored ventilation, respiratory function stable and patient connected to nasal cannula oxygen Cardiovascular status: stable and blood pressure returned to baseline Postop Assessment: no apparent nausea or vomiting Anesthetic complications: no  No notable events documented.  Last Vitals:  Vitals:   09/25/23 1530 09/25/23 1626  BP: 118/76 129/87  Pulse: 69 66  Resp: 14 18  Temp:  36.4 C  SpO2: 96% 99%    Last Pain:  Vitals:   09/25/23 1626  TempSrc: Oral  PainSc: 3                  Kennieth Rad

## 2023-09-25 NOTE — Anesthesia Preprocedure Evaluation (Signed)
 Anesthesia Evaluation  Patient identified by MRN, date of birth, ID band Patient awake    Reviewed: Allergy & Precautions, NPO status , Patient's Chart, lab work & pertinent test results  Airway Mallampati: IV  TM Distance: >3 FB Neck ROM: Full    Dental  (+) Dental Advisory Given   Pulmonary sleep apnea and Continuous Positive Airway Pressure Ventilation    breath sounds clear to auscultation       Cardiovascular hypertension, Pt. on medications  Rhythm:Regular Rate:Normal     Neuro/Psych negative neurological ROS     GI/Hepatic Neg liver ROS,GERD  ,,  Endo/Other    Class 3 obesity  Renal/GU negative Renal ROS     Musculoskeletal  (+) Arthritis ,    Abdominal   Peds  Hematology negative hematology ROS (+)   Anesthesia Other Findings   Reproductive/Obstetrics                             Anesthesia Physical Anesthesia Plan  ASA: 3  Anesthesia Plan: MAC   Post-op Pain Management: Tylenol PO (pre-op)* and Toradol IV (intra-op)*   Induction:   PONV Risk Score and Plan: 2 and Propofol infusion and Ondansetron  Airway Management Planned: Natural Airway and Simple Face Mask  Additional Equipment:   Intra-op Plan:   Post-operative Plan:   Informed Consent: I have reviewed the patients History and Physical, chart, labs and discussed the procedure including the risks, benefits and alternatives for the proposed anesthesia with the patient or authorized representative who has indicated his/her understanding and acceptance.       Plan Discussed with: CRNA  Anesthesia Plan Comments:         Anesthesia Quick Evaluation

## 2023-09-25 NOTE — Transfer of Care (Signed)
 Immediate Anesthesia Transfer of Care Note  Patient: Amy Diaz  Procedure(s) Performed: EXCISION RIGHT CHEST SEBACEOUS CYST (Right: Chest)  Patient Location: PACU  Anesthesia Type:MAC  Level of Consciousness: awake, alert , and oriented  Airway & Oxygen Therapy: Patient Spontanous Breathing  Post-op Assessment: Report given to RN  Post vital signs: stable  Last Vitals:  Vitals Value Taken Time  BP 126/81 09/25/23 1500  Temp    Pulse 74 09/25/23 1501  Resp 17 09/25/23 1500  SpO2 100 % 09/25/23 1501  Vitals shown include unfiled device data.  Last Pain:  Vitals:   09/25/23 1320  TempSrc:   PainSc: 0-No pain         Complications: No notable events documented.

## 2023-09-25 NOTE — Progress Notes (Signed)
 Pt's blood sugar 69. Dr. Sampson Goon aware and no orders to be given  at this time.

## 2023-09-26 ENCOUNTER — Ambulatory Visit: Payer: 59 | Admitting: Family Medicine

## 2023-09-26 ENCOUNTER — Encounter (HOSPITAL_COMMUNITY): Payer: Self-pay | Admitting: Surgery

## 2023-09-27 ENCOUNTER — Other Ambulatory Visit (HOSPITAL_COMMUNITY): Payer: Self-pay

## 2023-10-03 ENCOUNTER — Encounter: Payer: Self-pay | Admitting: Family Medicine

## 2023-10-03 ENCOUNTER — Other Ambulatory Visit (HOSPITAL_COMMUNITY): Payer: Self-pay

## 2023-10-03 ENCOUNTER — Ambulatory Visit (INDEPENDENT_AMBULATORY_CARE_PROVIDER_SITE_OTHER): Payer: 59 | Admitting: Family Medicine

## 2023-10-03 ENCOUNTER — Other Ambulatory Visit: Payer: Self-pay | Admitting: Medical-Surgical

## 2023-10-03 ENCOUNTER — Other Ambulatory Visit: Payer: Self-pay

## 2023-10-03 VITALS — BP 128/84 | HR 79 | Ht 63.0 in | Wt 240.0 lb

## 2023-10-03 DIAGNOSIS — E88819 Insulin resistance, unspecified: Secondary | ICD-10-CM

## 2023-10-03 DIAGNOSIS — E66813 Obesity, class 3: Secondary | ICD-10-CM | POA: Diagnosis not present

## 2023-10-03 DIAGNOSIS — E559 Vitamin D deficiency, unspecified: Secondary | ICD-10-CM

## 2023-10-03 DIAGNOSIS — Z6841 Body Mass Index (BMI) 40.0 and over, adult: Secondary | ICD-10-CM

## 2023-10-03 DIAGNOSIS — R7303 Prediabetes: Secondary | ICD-10-CM | POA: Diagnosis not present

## 2023-10-03 DIAGNOSIS — I10 Essential (primary) hypertension: Secondary | ICD-10-CM

## 2023-10-03 MED ORDER — PHENTERMINE HCL 15 MG PO CAPS
15.0000 mg | ORAL_CAPSULE | ORAL | 0 refills | Status: DC
  Filled 2023-10-03: qty 30, 30d supply, fill #0

## 2023-10-03 MED ORDER — METFORMIN HCL 500 MG PO TABS
500.0000 mg | ORAL_TABLET | Freq: Every day | ORAL | 0 refills | Status: DC
  Filled 2023-10-03: qty 30, 30d supply, fill #0

## 2023-10-03 NOTE — Progress Notes (Signed)
 Office: [phone]  /  Fax: [phone]  WEIGHT SUMMARY AND BIOMETRICS  Starting Date: 04/30/23  Starting Weight: 252lb   Weight Lost Since Last Visit: 5lb   Vitals BP: 128/84 Pulse Rate: 79 SpO2: 99 %   Body Composition  Body Fat %: 48.4 % Fat Mass (lbs): 116.4 lbs Muscle Mass (lbs): 118 lbs Total Body Water (lbs): 80.8 lbs Visceral Fat Rating : 15   HPI  Chief Complaint: OBESITY  Amy Diaz is here to discuss her progress with her obesity treatment plan. She is on the the Category 2 Plan and states she is following her eating plan approximately 50 % of the time. She states she is walking more.   Interval History:  Since last office visit she is down 5 lb She has had a cough/ respiratory infection since her sebaceous cyst removal She is taking Lomaira 8 mg bid  She has not had much of an appetite due to illness She has been taking metformin 500 mg once a day She is taking OTC cold medicine She is wearing her CPAP every night and is sleeping better She has a net weight loss of 12 lb in the past 5 mos of medically supervised weight management This is a 4.7% TBW loss   Pharmacotherapy: Lomaira 8 mg bid   PHYSICAL EXAM:  Blood pressure 128/84, pulse 79, height 5\' 3"  (1.6 m), weight 240 lb (108.9 kg), SpO2 99%. Body mass index is 42.51 kg/m.  General: She is overweight, wearing a mask cooperative, alert, well developed, and in no acute distress. PSYCH: Has normal mood, affect and thought process.   Lungs: Normal breathing effort, no conversational dyspnea.   ASSESSMENT AND PLAN  TREATMENT PLAN FOR OBESITY:  Recommended Dietary Goals  Amy Diaz is currently in the action stage of change. As such, her goal is to continue weight management plan. She has agreed to keeping a food journal and adhering to recommended goals of 1500 calories and 100 g of  protein and practicing portion control and making smarter food choices, such as increasing vegetables and decreasing  simple carbohydrates.  Behavioral Intervention  We discussed the following Behavioral Modification Strategies today: increasing lean protein intake to established goals, decreasing simple carbohydrates , increasing fiber rich foods, increasing water intake , work on tracking and journaling calories using tracking application, keeping healthy foods at home, decreasing eating out or consumption of processed foods, and making healthy choices when eating convenient foods, practice mindfulness eating and understand the difference between hunger signals and cravings, work on managing stress, creating time for self-care and relaxation, avoiding temptations and identifying enticing environmental cues, and continue to work on maintaining a reduced calorie state, getting the recommended amount of protein, incorporating whole foods, making healthy choices, staying well hydrated and practicing mindfulness when eating..  Additional resources provided today: NA  Recommended Physical Activity Goals  Amy Diaz has been advised to work up to 150 minutes of moderate intensity aerobic activity a week and strengthening exercises 2-3 times per week for cardiovascular health, weight loss maintenance and preservation of muscle mass.   She has agreed to Think about enjoyable ways to increase daily physical activity and overcoming barriers to exercise and Increase physical activity in their day and reduce sedentary time (increase NEAT). Plan to add in outdoor walking once cough resolves  Pharmacotherapy changes for the treatment of obesity: d/c Lomaira Begin Phentermine 15 mg 30 min before breakfast daily  ASSOCIATED CONDITIONS ADDRESSED TODAY  Prediabetes Lab Results  Component Value Date  HGBA1C 6.2 (H) 04/30/2023   She is doing well on metformin for PDM and IR She denies adverse SE She has room for improvement with dietary adherence and exercise She is actively working on weight loss -     metFORMIN HCl; Take 1  tablet (500 mg total) by mouth daily with breakfast.  Dispense: 30 tablet; Refill: 0  Class 3 severe obesity due to excess calories with body mass index (BMI) of 40.0 to 44.9 in adult, unspecified whether serious comorbidity present (HCC) -     Phentermine HCl; Take 1 capsule (15 mg total) by mouth every morning.  Dispense: 30 capsule; Refill: 0  Insulin resistance Plan to repeat fasting insulin in 1-2 mos Increase outdoor walking time to 30 min 3 x a week before next visit 09/06/23 BMP reviewed; WNL -     metFORMIN HCl; Take 1 tablet (500 mg total) by mouth daily with breakfast.  Dispense: 30 tablet; Refill: 0  Vitamin D deficiency Last vitamin D Lab Results  Component Value Date   VD25OH 34.6 04/30/2023   She is doing well on vitamin D OTC 4,000 international units  daily         She was informed of the importance of frequent follow up visits to maximize her success with intensive lifestyle modifications for her multiple health conditions.   ATTESTASTION STATEMENTS:  Reviewed by clinician on day of visit: allergies, medications, problem list, medical history, surgical history, family history, social history, and previous encounter notes pertinent to obesity diagnosis.   I have personally spent 30 minutes total time today in preparation, patient care, nutritional counseling and documentation for this visit, including the following: review of clinical lab tests; review of medical tests/procedures/services.      Glennis Brink, DO DABFM, DABOM [facility] [address] [place], [place] [address] [phone]

## 2023-10-04 ENCOUNTER — Other Ambulatory Visit: Payer: Self-pay

## 2023-10-04 ENCOUNTER — Other Ambulatory Visit (HOSPITAL_COMMUNITY): Payer: Self-pay

## 2023-10-04 MED ORDER — METOPROLOL SUCCINATE ER 25 MG PO TB24
25.0000 mg | ORAL_TABLET | Freq: Every day | ORAL | 0 refills | Status: DC
  Filled 2023-10-04: qty 90, 90d supply, fill #0

## 2023-10-10 ENCOUNTER — Other Ambulatory Visit (HOSPITAL_COMMUNITY): Payer: Self-pay

## 2023-10-18 DIAGNOSIS — G4733 Obstructive sleep apnea (adult) (pediatric): Secondary | ICD-10-CM | POA: Diagnosis not present

## 2023-10-19 ENCOUNTER — Ambulatory Visit: Payer: 59 | Admitting: Medical-Surgical

## 2023-10-25 ENCOUNTER — Other Ambulatory Visit (HOSPITAL_COMMUNITY): Payer: Self-pay

## 2023-10-25 ENCOUNTER — Ambulatory Visit (INDEPENDENT_AMBULATORY_CARE_PROVIDER_SITE_OTHER)

## 2023-10-25 ENCOUNTER — Encounter: Payer: Self-pay | Admitting: Medical-Surgical

## 2023-10-25 ENCOUNTER — Ambulatory Visit (INDEPENDENT_AMBULATORY_CARE_PROVIDER_SITE_OTHER): Admitting: Medical-Surgical

## 2023-10-25 ENCOUNTER — Other Ambulatory Visit: Payer: Self-pay

## 2023-10-25 VITALS — BP 129/84 | HR 80 | Resp 20 | Ht 63.0 in | Wt 247.3 lb

## 2023-10-25 DIAGNOSIS — R0602 Shortness of breath: Secondary | ICD-10-CM | POA: Diagnosis not present

## 2023-10-25 DIAGNOSIS — G4733 Obstructive sleep apnea (adult) (pediatric): Secondary | ICD-10-CM

## 2023-10-25 DIAGNOSIS — R059 Cough, unspecified: Secondary | ICD-10-CM

## 2023-10-25 DIAGNOSIS — R0989 Other specified symptoms and signs involving the circulatory and respiratory systems: Secondary | ICD-10-CM

## 2023-10-25 LAB — POCT INFLUENZA A/B
Influenza A, POC: NEGATIVE
Influenza B, POC: NEGATIVE

## 2023-10-25 LAB — POC COVID19 BINAXNOW: SARS Coronavirus 2 Ag: NEGATIVE

## 2023-10-25 MED ORDER — NEOMYCIN-POLYMYXIN-HC 1 % OT SOLN: 3.0000 [drp] | Freq: Four times a day (QID) | OTIC | 0 refills | Status: AC

## 2023-10-25 MED ORDER — HYDROXYZINE HCL 10 MG PO TABS
10.0000 mg | ORAL_TABLET | Freq: Three times a day (TID) | ORAL | 1 refills | Status: DC | PRN
  Filled 2023-10-25: qty 180, 30d supply, fill #0
  Filled 2024-02-24: qty 180, 30d supply, fill #1

## 2023-10-25 MED ORDER — HYDROCOD POLI-CHLORPHE POLI ER 10-8 MG/5ML PO SUER: 5.0000 mL | Freq: Two times a day (BID) | ORAL | 0 refills | Status: DC | PRN

## 2023-10-25 MED ORDER — AIRSUPRA 90-80 MCG/ACT IN AERO: 2.0000 | INHALATION_SPRAY | Freq: Four times a day (QID) | RESPIRATORY_TRACT | 11 refills | Status: DC | PRN

## 2023-10-25 NOTE — Telephone Encounter (Signed)
 Ear drops sent. Still working through the morning stuff in between patients. Sorry for the delay.

## 2023-10-25 NOTE — Progress Notes (Unsigned)
        Established patient visit  History, exam, impression, and plan:  1. OSA on CPAP (Primary) Pleasant 52 year old female presenting today to follow-up on her recent diagnosis of sleep apnea and starting a CPAP.  She notes that she has been doing very well using her CPAP and averages 5 to 7 hours nightly.  On workdays she only gets about 4 to 5 hours but this is because of having to wake up early.  She wears her CPAP every night.  Notes that she no longer has daily headaches and feels very rested.  She did have surgery recently on 2/25 for sebaceous cyst removal and woke up coughing after the general anesthesia.  This has made it difficult to wear her CPAP as she feels like she cannot breathe at times.  Once her URI symptoms resolved, plans to return to using her CPAP nightly.  Compliance report requested.  2. Respiratory symptoms As noted above, after having her surgery and general anesthesia, she woke up with a cough.  This has persisted and worsened.  She was exposed postsurgery to her husband who had an RSV like illness that was followed by pneumonia.  She developed an itchy throat with increased salivation, a metallic taste in her mouth, sinus congestion, chest congestion, itchy ears, and dyspnea on exertion about 4-5 days ago.  Her cough has become productive with thick, yellow mucus.  She has been taking Zyrtec and Pepcid with minimal benefit.  POCT flu, COVID, and strep all negative today.  She does have some chest congestion with diminished lung sounds throughout.  Getting chest x-ray today.  Adding Tussionex for cough management.  If symptoms still present in a couple of days, we may have to consider antibiotics for secondary bacterial infection.  Patient verbalized understanding and is agreeable. - POCT Influenza A/B - POC COVID-19 - POCT rapid strep A - DG Chest 2 View; Future  Procedures performed this visit: None.  Return in about 6 months (around 04/26/2024) for chronic disease  follow up.  __________________________________ Thayer Ohm, DNP, APRN, FNP-BC Primary Care and Sports Medicine [facility]

## 2023-10-25 NOTE — Telephone Encounter (Signed)
 See other MyChart message

## 2023-10-26 ENCOUNTER — Encounter: Payer: Self-pay | Admitting: Medical-Surgical

## 2023-11-01 ENCOUNTER — Other Ambulatory Visit: Payer: Self-pay

## 2023-11-01 ENCOUNTER — Ambulatory Visit: Admitting: Family Medicine

## 2023-11-01 ENCOUNTER — Other Ambulatory Visit (HOSPITAL_COMMUNITY): Payer: Self-pay

## 2023-11-01 ENCOUNTER — Encounter: Payer: Self-pay | Admitting: Family Medicine

## 2023-11-01 VITALS — BP 138/84 | HR 72 | Temp 98.2°F | Ht 63.0 in | Wt 241.0 lb

## 2023-11-01 DIAGNOSIS — I10 Essential (primary) hypertension: Secondary | ICD-10-CM | POA: Diagnosis not present

## 2023-11-01 DIAGNOSIS — E88819 Insulin resistance, unspecified: Secondary | ICD-10-CM | POA: Diagnosis not present

## 2023-11-01 DIAGNOSIS — E66813 Obesity, class 3: Secondary | ICD-10-CM | POA: Diagnosis not present

## 2023-11-01 DIAGNOSIS — Z6841 Body Mass Index (BMI) 40.0 and over, adult: Secondary | ICD-10-CM

## 2023-11-01 DIAGNOSIS — R7303 Prediabetes: Secondary | ICD-10-CM | POA: Diagnosis not present

## 2023-11-01 MED ORDER — METFORMIN HCL 500 MG PO TABS
500.0000 mg | ORAL_TABLET | Freq: Every day | ORAL | 0 refills | Status: DC
  Filled 2023-11-01: qty 30, 30d supply, fill #0

## 2023-11-01 NOTE — Progress Notes (Signed)
 Office: [phone]  /  Fax: [phone]  WEIGHT SUMMARY AND BIOMETRICS  Starting Date: 04/30/23  Starting Weight: 252lb   Weight Lost Since Last Visit: 0lb   Vitals Temp: 98.2 F (36.8 C) BP: 138/84 Pulse Rate: 72 SpO2: 99 %   Body Composition  Body Fat %: 47.9 % Fat Mass (lbs): 115.8 lbs Muscle Mass (lbs): 119.4 lbs Total Body Water (lbs): 81 lbs Visceral Fat Rating : 15   HPI  Chief Complaint: OBESITY  Amy Diaz is here to discuss her progress with her obesity treatment plan. She is on the the Category 2 Plan and states she is following her eating plan approximately 60 % of the time. She states she is exercising 30 minutes 4 times per week.  Interval History:  Since last office visit she is up 1 lb She did increase walking time on treadmill to 30 min 4 x a week She changed to Phentermine 15 mg in the mornings last visit She feels like her food choices are good (not logging) She is prone to skipping meals at work She is not drinking enough water Doing well on metformin 500 mg daily without adverse SE She is growing frustrated by her lack of weight loss.  Restarted HWW in Sept and has a net loss of 11 lb since then Weight graph over time:    Pharmacotherapy: phentermine 15 mg qAM  PHYSICAL EXAM:  Blood pressure 138/84, pulse 72, temperature 98.2 F (36.8 C), height 5\' 3"  (1.6 m), weight 241 lb (109.3 kg), SpO2 99%. Body mass index is 42.69 kg/m.  General: She is overweight, cooperative, alert, well developed, and in no acute distress. PSYCH: Has normal mood, affect and thought process.   Lungs: Normal breathing effort, no conversational dyspnea.   ASSESSMENT AND PLAN  TREATMENT PLAN FOR OBESITY:  Recommended Dietary Goals  Amy Diaz is currently in the action stage of change. As such, her goal is to continue weight management plan. She has agreed to the Category 3 Plan and keeping a food journal and adhering to recommended goals of 1400 calories and 90  g of  protein.  Behavioral Intervention  We discussed the following Behavioral Modification Strategies today: increasing lean protein intake to established goals, increasing fiber rich foods, avoiding skipping meals, increasing water intake , work on meal planning and preparation, work on Counselling psychologist calories using tracking application, keeping healthy foods at home, practice mindfulness eating and understand the difference between hunger signals and cravings, work on managing stress, creating time for self-care and relaxation, and continue to work on maintaining a reduced calorie state, getting the recommended amount of protein, incorporating whole foods, making healthy choices, staying well hydrated and practicing mindfulness when eating..  Additional resources provided today: NA  Recommended Physical Activity Goals  Amy Diaz has been advised to work up to 150 minutes of moderate intensity aerobic activity a week and strengthening exercises 2-3 times per week for cardiovascular health, weight loss maintenance and preservation of muscle mass.   She has agreed to Increase the intensity, frequency or duration of aerobic exercises    Pharmacotherapy changes for the treatment of obesity: discontinue Phentermine due to lack of weight loss  ASSOCIATED CONDITIONS ADDRESSED TODAY  Prediabetes Lab Results  Component Value Date   HGBA1C 6.2 (H) 04/30/2023  She is doing well on metformin 500 mg daily with food without GI upset Cravings have improved some She is working on weight reduction, reducing starches and sugar She has ramped up her walking time  Repeat labs today.  -     metFORMIN HCl; Take 1 tablet (500 mg total) by mouth daily with breakfast.  Dispense: 30 tablet; Refill: 0 -     Hemoglobin A1c -     Basic metabolic panel with GFR  Class 3 severe obesity due to excess calories with body mass index (BMI) of 40.0 to 44.9 in adult, unspecified whether serious comorbidity present  (HCC) We reviewed her overall progress with weight loss over time She has been >205 lb for the past 10 years Her goal weight is ~170 and she has never been able to get that low even while on Wegovy in the past.  She has no insurance coverage for GLP-1RA or GLP-1/ GIP RA agents.  She has failed to see weight loss on phentermine.  She has been actively working on prescribed dietary plan with regular exercise since late Sept and has lost 11 lb total.  Discussed the need for lean protein and fiber at mealtime instead of skipping meals at work.  We discussed the role of bariatric surgery as a tool to aid her in her weight loss journey.  She is interested in VSG and we discussed attending an online bariatric surgery seminar with [facility]. -     Ambulatory referral to General Surgery  Insulin resistance Working on lifestyle changes and doing well on metformin Repeat lab today -     metFORMIN HCl; Take 1 tablet (500 mg total) by mouth daily with breakfast.  Dispense: 30 tablet; Refill: 0 -     Insulin, random  Essential hypertension BP at goal on Toprol XL 25 mg once daily and Diovan HCTZ 320/25 mg once daily She has seen no change in her blood pressure with weight reduction Avoid higher doses of phentermine given her history of hypertension  Continue current antihypertensive medications per PCP     She was informed of the importance of frequent follow up visits to maximize her success with intensive lifestyle modifications for her multiple health conditions.   ATTESTASTION STATEMENTS:  Reviewed by clinician on day of visit: allergies, medications, problem list, medical history, surgical history, family history, social history, and previous encounter notes pertinent to obesity diagnosis.   I have personally spent 32 minutes total time today in preparation, patient care, nutritional counseling and documentation for this visit, including the following: review of clinical lab  tests; review of medical tests/procedures/services.      Glennis Brink, DO DABFM, DABOM [facility] [address] [place], [place] [address] [phone]

## 2023-11-02 LAB — BASIC METABOLIC PANEL WITH GFR
BUN/Creatinine Ratio: 14 (ref 9–23)
BUN: 14 mg/dL (ref 6–24)
CO2: 23 mmol/L (ref 20–29)
Calcium: 10.2 mg/dL (ref 8.7–10.2)
Chloride: 102 mmol/L (ref 96–106)
Creatinine, Ser: 0.98 mg/dL (ref 0.57–1.00)
Glucose: 87 mg/dL (ref 70–99)
Potassium: 4.4 mmol/L (ref 3.5–5.2)
Sodium: 141 mmol/L (ref 134–144)
eGFR: 69 mL/min/{1.73_m2} (ref >59)

## 2023-11-02 LAB — HEMOGLOBIN A1C
Est. average glucose Bld gHb Est-mCnc: 126 mg/dL
Hgb A1c MFr Bld: 6 % — ABNORMAL HIGH (ref 4.8–5.6)

## 2023-11-02 LAB — INSULIN, RANDOM: INSULIN: 33.3 u[IU]/mL — ABNORMAL HIGH (ref 2.6–24.9)

## 2023-11-05 ENCOUNTER — Other Ambulatory Visit: Payer: Self-pay | Admitting: Family Medicine

## 2023-11-05 DIAGNOSIS — E66813 Obesity, class 3: Secondary | ICD-10-CM

## 2023-11-05 MED ORDER — TIRZEPATIDE-WEIGHT MANAGEMENT 2.5 MG/0.5ML ~~LOC~~ SOLN: 2.5000 mg | SUBCUTANEOUS | 0 refills | Status: DC

## 2023-11-18 DIAGNOSIS — G4733 Obstructive sleep apnea (adult) (pediatric): Secondary | ICD-10-CM | POA: Diagnosis not present

## 2023-11-29 ENCOUNTER — Other Ambulatory Visit: Payer: Self-pay | Admitting: Family Medicine

## 2023-12-03 ENCOUNTER — Encounter: Payer: Self-pay | Admitting: Family Medicine

## 2023-12-03 ENCOUNTER — Ambulatory Visit (INDEPENDENT_AMBULATORY_CARE_PROVIDER_SITE_OTHER): Admitting: Family Medicine

## 2023-12-03 VITALS — BP 92/68 | HR 73 | Temp 98.0°F | Ht 63.0 in | Wt 238.0 lb

## 2023-12-03 DIAGNOSIS — G4733 Obstructive sleep apnea (adult) (pediatric): Secondary | ICD-10-CM | POA: Diagnosis not present

## 2023-12-03 DIAGNOSIS — E88819 Insulin resistance, unspecified: Secondary | ICD-10-CM

## 2023-12-03 DIAGNOSIS — E66813 Obesity, class 3: Secondary | ICD-10-CM | POA: Diagnosis not present

## 2023-12-03 DIAGNOSIS — Z6841 Body Mass Index (BMI) 40.0 and over, adult: Secondary | ICD-10-CM | POA: Diagnosis not present

## 2023-12-03 DIAGNOSIS — R7303 Prediabetes: Secondary | ICD-10-CM

## 2023-12-03 MED ORDER — TIRZEPATIDE-WEIGHT MANAGEMENT 5 MG/0.5ML ~~LOC~~ SOLN: 5.0000 mg | SUBCUTANEOUS | 0 refills | Status: DC

## 2023-12-03 NOTE — Progress Notes (Signed)
 Office: [phone]  /  Fax: [phone]  WEIGHT SUMMARY AND BIOMETRICS  Starting Date: 04/30/23  Starting Weight: 252lb   Weight Lost Since Last Visit: 3lb   Vitals Temp: 98 F (36.7 C) BP: 92/68 Pulse Rate: 73 SpO2: 100 %   Body Composition  Body Fat %: 47.8 % Fat Mass (lbs): 113.8 lbs Muscle Mass (lbs): 118 lbs Total Body Water (lbs): 80.4 lbs Visceral Fat Rating : 15   HPI  Chief Complaint: OBESITY  Sanaai is here to discuss her progress with her obesity treatment plan. She is on the keeping a food journal and adhering to recommended goals of 1400 calories and 90 protein and states she is following her eating plan approximately 70-75 % of the time. She states she is exercising 40 minutes 4 times per week.  Interval History:  Since last office visit she is down 3 lb She did start Zepbound  cash pay on 2.5 mg weekly She has seen improvements in appetite and cravings She denies abdominal pain, nausea or heartburn She did do surgical seminar for bariatric surgery with [facility]  Plans to attend surgery consult with her husband She has been walking on the treadmill 4 days/ wk  Pharmacotherapy: Zepbound  2.5 mg weekly  PHYSICAL EXAM:  Blood pressure 92/68, pulse 73, temperature 98 F (36.7 C), height 5\' 3"  (1.6 m), weight 238 lb (108 kg), SpO2 100%. Body mass index is 42.16 kg/m.  General: She is overweight, cooperative, alert, well developed, and in no acute distress. PSYCH: Has normal mood, affect and thought process.   Lungs: Normal breathing effort, no conversational dyspnea.  ASSESSMENT AND PLAN  TREATMENT PLAN FOR OBESITY:  Recommended Dietary Goals  Marzella is currently in the action stage of change. As such, her goal is to continue weight management plan. She has agreed to the Category 3 Plan. Copy of meal plan given  Behavioral Intervention  We discussed the following Behavioral Modification Strategies today: increasing lean protein intake to  established goals, increasing fiber rich foods, work on meal planning and preparation, keeping healthy foods at home, identifying sources and decreasing liquid calories, practice mindfulness eating and understand the difference between hunger signals and cravings, work on managing stress, creating time for self-care and relaxation, avoiding temptations and identifying enticing environmental cues, continue to practice mindfulness when eating, planning for success, and continue to work on maintaining a reduced calorie state, getting the recommended amount of protein, incorporating whole foods, making healthy choices, staying well hydrated and practicing mindfulness when eating..  Additional resources provided today: NA  Recommended Physical Activity Goals  Dariella has been advised to work up to 150 minutes of moderate intensity aerobic activity a week and strengthening exercises 2-3 times per week for cardiovascular health, weight loss maintenance and preservation of muscle mass.   She has agreed to Increase the intensity, frequency or duration of strengthening exercises  and Increase the intensity, frequency or duration of aerobic exercises    Pharmacotherapy changes for the treatment of obesity: increase Zepbound  to 5 mg weekly thru [employer] [facility]  ASSOCIATED CONDITIONS ADDRESSED TODAY  Prediabetes Lab Results  Component Value Date   HGBA1C 6.0 (H) 11/01/2023   She is doing well with A1c reduction  Reviewed lab from last visit She has been taking metformin  500 mg daily and now Zepbound  x 3 weeks for obesity management She is working on prescribed diet and weight reduction  D/c metformin  for now Continue Zepbound  + current lifestyle changes  Insulin  resistance Tolerated metformin  well  but will hold metformin  while on Zepbound  Continue to limit added sugar, starches while increasing walking time and weight training to 4-5 days/ wk  Class 3 severe obesity due to excess calories with serious  comorbidity and body mass index (BMI) of 40.0 to 44.9 in adult -     Tirzepatide -Weight Management; Inject 5 mg into the skin once a week.  Dispense: 2 mL; Refill: 0 Short term will finish out Zebpound 2.5 mg x 4 weeks total then increase to 5 mg weekly (cash pay).  Long term, she plans to meet with surgeon at [facility] to discuss bariatric surgery options.  Husband is supportive.  She has already completed seminar and is working on lifestyle changes  OSA on CPAP She has started CPAP nightly for OSA Aim for 7-8 hrs of high quality sleep at night with CPAP    She was informed of the importance of frequent follow up visits to maximize her success with intensive lifestyle modifications for her multiple health conditions.   ATTESTASTION STATEMENTS:  Reviewed by clinician on day of visit: allergies, medications, problem list, medical history, surgical history, family history, social history, and previous encounter notes pertinent to obesity diagnosis.   I have personally spent 30 minutes total time today in preparation, patient care, nutritional counseling and education,  and documentation for this visit, including the following: review of most recent clinical lab tests, prescribing medications/ refilling medications, reviewing medical assistant documentation, review and interpretation of bioimpedence results.     Micky Albee, D.O. DABFM, DABOM [facility] [address] [place], [place] [address] [phone]

## 2023-12-12 ENCOUNTER — Other Ambulatory Visit: Payer: Self-pay | Admitting: Medical-Surgical

## 2023-12-12 DIAGNOSIS — M170 Bilateral primary osteoarthritis of knee: Secondary | ICD-10-CM

## 2023-12-13 ENCOUNTER — Other Ambulatory Visit (HOSPITAL_COMMUNITY): Payer: Self-pay

## 2023-12-13 ENCOUNTER — Other Ambulatory Visit: Payer: Self-pay

## 2023-12-13 MED ORDER — CELECOXIB 100 MG PO CAPS
100.0000 mg | ORAL_CAPSULE | Freq: Every day | ORAL | 0 refills | Status: DC
  Filled 2023-12-13: qty 90, 90d supply, fill #0

## 2023-12-18 DIAGNOSIS — G4733 Obstructive sleep apnea (adult) (pediatric): Secondary | ICD-10-CM | POA: Diagnosis not present

## 2023-12-19 ENCOUNTER — Telehealth: Admitting: Family Medicine

## 2023-12-19 DIAGNOSIS — R053 Chronic cough: Secondary | ICD-10-CM | POA: Diagnosis not present

## 2023-12-19 MED ORDER — PREDNISONE 10 MG (21) PO TBPK: ORAL_TABLET | ORAL | 0 refills | Status: DC

## 2023-12-19 MED ORDER — PROMETHAZINE-DM 6.25-15 MG/5ML PO SYRP: 5.0000 mL | ORAL_SOLUTION | Freq: Four times a day (QID) | ORAL | 0 refills | Status: DC | PRN

## 2023-12-19 NOTE — Patient Instructions (Addendum)
 Amy Diaz, thank you for joining [name], NP for today's virtual visit.  While this provider is not your primary care provider (PCP), if your PCP is located in our provider database this encounter information will be shared with them immediately following your visit.   A [facility] MyChart account gives you access to today's visit and all your visits, tests, and labs performed at [facility] " click here if you don't have a [facility] MyChart account or go to [email or web address]  Consent: (Patient) Tecia Cinnamon provided verbal consent for this virtual visit at the beginning of the encounter.  Current Medications:  Current Outpatient Medications:    predniSONE  (STERAPRED UNI-PAK 21 TAB) 10 MG (21) TBPK tablet, Take as directed, Disp: 21 tablet, Rfl: 0   promethazine-dextromethorphan (PROMETHAZINE-DM) 6.25-15 MG/5ML syrup, Take 5 mLs by mouth 4 (four) times daily as needed for cough., Disp: 118 mL, Rfl: 0   Albuterol-Budesonide (AIRSUPRA ) 90-80 MCG/ACT AERO, Inhale 2 puffs into the lungs every 6 (six) hours as needed., Disp: 1 g, Rfl: 11   AMBULATORY NON FORMULARY MEDICATION, Continuous positive airway pressure (CPAP) machine set on AutoPAP (4-20 cmH2O), with all supplemental supplies as needed., Disp: 1 each, Rfl: 0   aspirin  EC 81 MG tablet, Take 1 tablet (81 mg total) by mouth daily. Swallow whole., Disp: 90 tablet, Rfl: 3   atorvastatin  (LIPITOR) 10 MG tablet, Take 1 tablet (10 mg total) by mouth daily., Disp: 90 tablet, Rfl: 3   Brimonidine Tartrate (LUMIFY) 0.025 % SOLN, Place 1 drop into both eyes in the morning., Disp: , Rfl:    busPIRone  (BUSPAR ) 5 MG tablet, Take 1-3 tablets (5-15 mg total) by mouth 2 (two) times daily as needed. (Patient taking differently: Take 5 mg by mouth in the morning.), Disp: 90 tablet, Rfl: 3   celecoxib  (CELEBREX ) 100 MG capsule, Take 1 capsule (100 mg total) by mouth daily., Disp: 90 capsule, Rfl: 0   cetirizine  (ZYRTEC ) 10 MG  tablet, Take 1 tablet (10 mg total) by mouth daily., Disp: 90 tablet, Rfl: 1   famotidine  (PEPCID ) 40 MG tablet, Take 1 tablet (40 mg total) by mouth at bedtime., Disp: 90 tablet, Rfl: 1   Fluocinolone  Acetonide Body 0.01 % OIL, Apply 1 application  topically daily., Disp: 118.28 mL, Rfl: 2   furosemide  (LASIX ) 20 MG tablet, Take 1 tablet (20 mg total) by mouth daily as needed., Disp: 30 tablet, Rfl: 1   hydrOXYzine  (ATARAX ) 10 MG tablet, Take 1-2 tablets (10-20 mg total) by mouth 3 (three) times daily as needed., Disp: 180 tablet, Rfl: 1   methocarbamol  (ROBAXIN ) 500 MG tablet, Take 1 tablet (500 mg total) by mouth 3 (three) times daily. (Patient taking differently: Take 500 mg by mouth in the morning.), Disp: 90 tablet, Rfl: 0   metoprolol  succinate (TOPROL -XL) 25 MG 24 hr tablet, Take 1 tablet (25 mg total) by mouth daily., Disp: 90 tablet, Rfl: 0   Multiple Vitamin (MULTIVITAMIN) tablet, Take 1 tablet by mouth in the morning., Disp: , Rfl:    omeprazole  (PRILOSEC) 40 MG capsule, Take 1 capsule (40 mg total) by mouth daily before supper., Disp: 90 capsule, Rfl: 0   Propylene Glycol (SYSTANE COMPLETE) 0.6 % SOLN, Place 1 drop into both eyes at bedtime., Disp: , Rfl:    tirzepatide  5 MG/0.5ML injection vial, Inject 5 mg into the skin once a week., Disp: 2 mL, Rfl: 0   traZODone  (DESYREL ) 50 MG tablet, Take 0.5-1 tablets (25-50 mg total) by  mouth at bedtime as needed for sleep., Disp: 90 tablet, Rfl: 1   valsartan -hydrochlorothiazide  (DIOVAN -HCT) 320-25 MG tablet, Take 1 tablet by mouth daily., Disp: 90 tablet, Rfl: 1   VITAMIN D  PO, Take 4,000 Units by mouth in the morning., Disp: , Rfl:    Medications ordered in this encounter:  Meds ordered this encounter  Medications   predniSONE  (STERAPRED UNI-PAK 21 TAB) 10 MG (21) TBPK tablet    Sig: Take as directed    Dispense:  21 tablet    Refill:  0    Supervising Provider:   LAMPTEY, PHILIP O [[id number]]   promethazine-dextromethorphan  (PROMETHAZINE-DM) 6.25-15 MG/5ML syrup    Sig: Take 5 mLs by mouth 4 (four) times daily as needed for cough.    Dispense:  118 mL    Refill:  0    Supervising Provider:   Corine Dice [[id number]]     *If you need refills on other medications prior to your next appointment, please contact your pharmacy*  Follow-Up: Call back or seek an in-person evaluation if the symptoms worsen or if the condition fails to improve as anticipated.  [facility] [phone]  Other Instructions  - Take meds as prescribed - Rest voice - Use a cool mist humidifier especially during the winter months when heat dries out the air. - Use saline nose sprays frequently to help soothe nasal passages if they are drying out. - Stay hydrated by drinking plenty of fluids - Keep thermostat turn down low to prevent drying out which can cause a dry cough.    If you have been instructed to have an in-person evaluation today at a local Urgent Care facility, please use the link below. It will take you to a list of all of our available [facility], including address, phone number and hours of operation. Please do not delay care.  [facility]  If you or a family member do not have a primary care provider, use the link below to schedule a visit and establish care. When you choose a [facility] primary care physician or advanced practice provider, you gain a long-term partner in health. Find a Primary Care Provider  Learn more about [facility] in-office and virtual care options: [facility] Get Care Now

## 2023-12-19 NOTE — Progress Notes (Signed)
 Virtual Visit Consent   Amy Diaz, you are scheduled for a virtual visit with a [facility] provider today. Just as with appointments in the office, your consent must be obtained to participate. Your consent will be active for this visit and any virtual visit you may have with one of our providers in the next 365 days. If you have a MyChart account, a copy of this consent can be sent to you electronically.  As this is a virtual visit, video technology does not allow for your provider to perform a traditional examination. This may limit your provider's ability to fully assess your condition. If your provider identifies any concerns that need to be evaluated in person or the need to arrange testing (such as labs, EKG, etc.), we will make arrangements to do so. Although advances in technology are sophisticated, we cannot ensure that it will always work on either your end or our end. If the connection with a video visit is poor, the visit may have to be switched to a telephone visit. With either a video or telephone visit, we are not always able to ensure that we have a secure connection.  By engaging in this virtual visit, you consent to the provision of healthcare and authorize for your insurance to be billed (if applicable) for the services provided during this visit. Depending on your insurance coverage, you may receive a charge related to this service.  I need to obtain your verbal consent now. Are you willing to proceed with your visit today? Amy Diaz has provided verbal consent on 12/19/2023 for a virtual visit (video or telephone). [name], NP  Date: 12/19/2023 10:36 AM   Virtual Visit via Video Note   I, [name], connected with  Amy Diaz  ([id number], 06-Feb-1971, 53) on 12/19/23 at 10:30 AM EDT by a video-enabled telemedicine application and verified that I am speaking with the correct person using two identifiers.  Location: Patient: Virtual Visit Location Patient:  Home Provider: Virtual Visit Location Provider: Home Office   I discussed the limitations of evaluation and management by telemedicine and the availability of in person appointments. The patient expressed understanding and agreed to proceed.    History of Present Illness: Amy Diaz is a 53 y.o. who identifies as a female who was assigned female at birth, and is being seen today for Chronic cough  Onset was 3 months on going- thought it was allergies- ears are itching, throat is itching Associated symptoms are runny nose, congestion Modifying factors are mucinex, robitussin, corticin, Pepcid , atarax , and zyrtec , inhaler (nothing is helping much)  Denies chest pain, shortness of breath, fevers, chills    Problems:  Patient Active Problem List   Diagnosis Date Noted   Cutaneous abscess of abdominal wall 08/24/2023   OSA on CPAP 07/02/2023   Insulin  resistance 05/15/2023   Vitamin D  deficiency 05/15/2023   Other fatigue 04/30/2023   SOBOE (shortness of breath on exertion) 04/30/2023   Hyperlipidemia 04/30/2023   DDD (degenerative disc disease), lumbar 04/30/2023   Polyphagia 04/30/2023   Depression screen 04/30/2023   BMI 40.0-44.9, adult (HCC) 04/30/2023   Loud snoring 01/23/2023   Person encountering health services to consult on behalf of another person 05/11/2022   Bilateral hand pain 05/02/2022   Primary osteoarthritis of both knees 02/20/2022   Prediabetes 11/03/2019   Morbid obesity (HCC) 11/03/2019   Insomnia 11/03/2019   Fall 11/03/2019   Essential hypertension 11/03/2019    Allergies:  Allergies  Allergen Reactions  Lisinopril Cough   Sumatriptan Other (See Comments)    chest tightness   Medications:  Current Outpatient Medications:    Albuterol-Budesonide (AIRSUPRA ) 90-80 MCG/ACT AERO, Inhale 2 puffs into the lungs every 6 (six) hours as needed., Disp: 1 g, Rfl: 11   AMBULATORY NON FORMULARY MEDICATION, Continuous positive airway pressure (CPAP) machine set  on AutoPAP (4-20 cmH2O), with all supplemental supplies as needed., Disp: 1 each, Rfl: 0   aspirin  EC 81 MG tablet, Take 1 tablet (81 mg total) by mouth daily. Swallow whole., Disp: 90 tablet, Rfl: 3   atorvastatin  (LIPITOR) 10 MG tablet, Take 1 tablet (10 mg total) by mouth daily., Disp: 90 tablet, Rfl: 3   Brimonidine Tartrate (LUMIFY) 0.025 % SOLN, Place 1 drop into both eyes in the morning., Disp: , Rfl:    busPIRone  (BUSPAR ) 5 MG tablet, Take 1-3 tablets (5-15 mg total) by mouth 2 (two) times daily as needed. (Patient taking differently: Take 5 mg by mouth in the morning.), Disp: 90 tablet, Rfl: 3   celecoxib  (CELEBREX ) 100 MG capsule, Take 1 capsule (100 mg total) by mouth daily., Disp: 90 capsule, Rfl: 0   cetirizine  (ZYRTEC ) 10 MG tablet, Take 1 tablet (10 mg total) by mouth daily., Disp: 90 tablet, Rfl: 1   chlorpheniramine-HYDROcodone  (TUSSIONEX) 10-8 MG/5ML, Take 5 mLs by mouth every 12 (twelve) hours as needed for cough (cough, will cause drowsiness.)., Disp: 115 mL, Rfl: 0   famotidine  (PEPCID ) 40 MG tablet, Take 1 tablet (40 mg total) by mouth at bedtime., Disp: 90 tablet, Rfl: 1   Fluocinolone  Acetonide Body 0.01 % OIL, Apply 1 application  topically daily., Disp: 118.28 mL, Rfl: 2   furosemide  (LASIX ) 20 MG tablet, Take 1 tablet (20 mg total) by mouth daily as needed., Disp: 30 tablet, Rfl: 1   hydrOXYzine  (ATARAX ) 10 MG tablet, Take 1-2 tablets (10-20 mg total) by mouth 3 (three) times daily as needed., Disp: 180 tablet, Rfl: 1   methocarbamol  (ROBAXIN ) 500 MG tablet, Take 1 tablet (500 mg total) by mouth 3 (three) times daily. (Patient taking differently: Take 500 mg by mouth in the morning.), Disp: 90 tablet, Rfl: 0   metoprolol  succinate (TOPROL -XL) 25 MG 24 hr tablet, Take 1 tablet (25 mg total) by mouth daily., Disp: 90 tablet, Rfl: 0   Multiple Vitamin (MULTIVITAMIN) tablet, Take 1 tablet by mouth in the morning., Disp: , Rfl:    omeprazole  (PRILOSEC) 40 MG capsule, Take 1  capsule (40 mg total) by mouth daily before supper., Disp: 90 capsule, Rfl: 0   Propylene Glycol (SYSTANE COMPLETE) 0.6 % SOLN, Place 1 drop into both eyes at bedtime., Disp: , Rfl:    tirzepatide  5 MG/0.5ML injection vial, Inject 5 mg into the skin once a week., Disp: 2 mL, Rfl: 0   traZODone  (DESYREL ) 50 MG tablet, Take 0.5-1 tablets (25-50 mg total) by mouth at bedtime as needed for sleep., Disp: 90 tablet, Rfl: 1   valsartan -hydrochlorothiazide  (DIOVAN -HCT) 320-25 MG tablet, Take 1 tablet by mouth daily., Disp: 90 tablet, Rfl: 1   VITAMIN D  PO, Take 4,000 Units by mouth in the morning., Disp: , Rfl:   Observations/Objective: Patient is well-developed, well-nourished in no acute distress.  Resting comfortably  at home.  Head is normocephalic, atraumatic.  No labored breathing.  Speech is clear and coherent with logical content.  Patient is alert and oriented at baseline.  Cough constant  Assessment and Plan:  1. Persistent cough (Primary)  - predniSONE  (STERAPRED UNI-PAK 21  TAB) 10 MG (21) TBPK tablet; Take as directed  Dispense: 21 tablet; Refill: 0 - promethazine-dextromethorphan (PROMETHAZINE-DM) 6.25-15 MG/5ML syrup; Take 5 mLs by mouth 4 (four) times daily as needed for cough.  Dispense: 118 mL; Refill: 0   - Take meds as prescribed - Rest voice - Use a cool mist humidifier especially during the winter months when heat dries out the air. - Use saline nose sprays frequently to help soothe nasal passages if they are drying out. - Stay hydrated by drinking plenty of fluids - Keep thermostat turn down low to prevent drying out which can cause a dry cough.   If you do not improve you will need a follow up visit in person.                 Reviewed side effects, risks and benefits of medication.    Patient acknowledged agreement and understanding of the plan.   Past Medical, Surgical, Social History, Allergies, and Medications have been Reviewed.    Follow Up  Instructions: I discussed the assessment and treatment plan with the patient. The patient was provided an opportunity to ask questions and all were answered. The patient agreed with the plan and demonstrated an understanding of the instructions.  A copy of instructions were sent to the patient via MyChart unless otherwise noted below.    The patient was advised to call back or seek an in-person evaluation if the symptoms worsen or if the condition fails to improve as anticipated.    [name], NP

## 2023-12-20 ENCOUNTER — Other Ambulatory Visit: Payer: Self-pay | Admitting: Family Medicine

## 2023-12-20 DIAGNOSIS — Z6841 Body Mass Index (BMI) 40.0 and over, adult: Secondary | ICD-10-CM

## 2023-12-21 ENCOUNTER — Other Ambulatory Visit: Payer: Self-pay | Admitting: Medical-Surgical

## 2023-12-27 ENCOUNTER — Telehealth: Payer: Self-pay | Admitting: *Deleted

## 2023-12-27 NOTE — Telephone Encounter (Signed)
 Left voice mail for patient to call back to schedule consult per Dr. Ambrosio Junker to fill out paper work for [facility].

## 2023-12-31 ENCOUNTER — Ambulatory Visit (INDEPENDENT_AMBULATORY_CARE_PROVIDER_SITE_OTHER): Admitting: Family Medicine

## 2023-12-31 ENCOUNTER — Encounter: Payer: Self-pay | Admitting: Family Medicine

## 2023-12-31 VITALS — BP 105/74 | HR 88 | Temp 98.1°F | Ht 63.0 in | Wt 234.0 lb

## 2023-12-31 DIAGNOSIS — G4733 Obstructive sleep apnea (adult) (pediatric): Secondary | ICD-10-CM | POA: Diagnosis not present

## 2023-12-31 DIAGNOSIS — R7303 Prediabetes: Secondary | ICD-10-CM | POA: Diagnosis not present

## 2023-12-31 DIAGNOSIS — Z6841 Body Mass Index (BMI) 40.0 and over, adult: Secondary | ICD-10-CM

## 2023-12-31 DIAGNOSIS — E66813 Obesity, class 3: Secondary | ICD-10-CM | POA: Diagnosis not present

## 2023-12-31 DIAGNOSIS — I10 Essential (primary) hypertension: Secondary | ICD-10-CM

## 2023-12-31 MED ORDER — VALSARTAN-HYDROCHLOROTHIAZIDE 320-25 MG PO TABS: 0.5000 | ORAL_TABLET | Freq: Every day | ORAL | Status: DC

## 2023-12-31 MED ORDER — TIRZEPATIDE-WEIGHT MANAGEMENT 7.5 MG/0.5ML ~~LOC~~ SOLN: 7.5000 mg | SUBCUTANEOUS | 0 refills | Status: DC

## 2023-12-31 NOTE — Patient Instructions (Addendum)
 Reduce Valsartan /hydrochlorothiazide  to one half tab daily  Finish out Zepbound  5 mg weekly then go up to 7.5 mg weekly  Increase treadmill walking to 30+ min 5 days/ wk  Swap out acai bowls for: [place] yogurt in the blender (with < 8 g of sugar) + a handful of frozen fruit, fairlife milk or unsweetened almond milk Can add chia seeds or spinach for more fiber

## 2023-12-31 NOTE — Progress Notes (Signed)
 Office: [phone]  /  Fax: [phone]  WEIGHT SUMMARY AND BIOMETRICS  Starting Date: 04/30/23  Starting Weight: 252lb   Weight Lost Since Last Visit: 4lb   Vitals Temp: 98.1 F (36.7 C) BP: 105/74 Pulse Rate: 88 SpO2: 99 %   Body Composition  Body Fat %: 44.9 % Fat Mass (lbs): 105 lbs Muscle Mass (lbs): 122.4 lbs Total Body Water (lbs): 81.2 lbs Visceral Fat Rating : 14    HPI  Chief Complaint: OBESITY  Jayla is here to discuss her progress with her obesity treatment plan. She is on the the Category 3 Plan and states she is following her eating plan approximately 60 % of the time. She states she is exercising 30-60 minutes 4 times per week.  Interval History:  Since last office visit she is down 4 lb She is up 4.4 pounds of the mass and down 7.2 pounds of body fat She has improvements in appetite control with Zepbound  5 mg daily She has been going out for an Acai bowl daily She has been eating more fruit She has a net weight loss of 18 lb in the past 8 mos of medically supervised weight management This is a 7.1% total body weight loss She was on prednisone  for a cough between visits She did complete bariatric surgery seminar with [facility] and has an upcoming visit scheduled to discuss surgery options  Pharmacotherapy: Zepbound  5 mg once weekly injection, cash pay  PHYSICAL EXAM:  Blood pressure 105/74, pulse 88, temperature 98.1 F (36.7 C), height 5\' 3"  (1.6 m), weight 234 lb (106.1 kg), SpO2 99%. Body mass index is 41.45 kg/m.  General: She is overweight, cooperative, alert, well developed, and in no acute distress. PSYCH: Has normal mood, affect and thought process.   Lungs: Normal breathing effort, no conversational dyspnea.  ASSESSMENT AND PLAN  TREATMENT PLAN FOR OBESITY:  Recommended Dietary Goals  Lydian is currently in the action stage of change. As such, her goal is to continue weight management plan. She has agreed to  the Category 3 Plan. Reviewed the nutritional information on her acai bowl, purchased at [employer] which does contain 55 g of sugar and only 4 g of protein  Behavioral Intervention  We discussed the following Behavioral Modification Strategies today: increasing lean protein intake to established goals, increasing fiber rich foods, work on meal planning and preparation, keeping healthy foods at home, identifying sources and decreasing liquid calories, decreasing eating out or consumption of processed foods, and making healthy choices when eating convenient foods, practice mindfulness eating and understand the difference between hunger signals and cravings, work on managing stress, creating time for self-care and relaxation, avoiding temptations and identifying enticing environmental cues, continue to work on implementation of reduced calorie nutritional plan, continue to practice mindfulness when eating, and continue to work on maintaining a reduced calorie state, getting the recommended amount of protein, incorporating whole foods, making healthy choices, staying well hydrated and practicing mindfulness when eating..  Additional resources provided today: NA  Recommended Physical Activity Goals  Henya has been advised to work up to 150 minutes of moderate intensity aerobic activity a week and strengthening exercises 2-3 times per week for cardiovascular health, weight loss maintenance and preservation of muscle mass.   She has agreed to Start aerobic activity with a goal of 150 minutes a week at moderate intensity.  Reviewed exercise change goals together and after visit summary  Pharmacotherapy changes for the treatment of obesity: Increase Zepbound   to 7.5 mg once weekly injection  ASSOCIATED CONDITIONS ADDRESSED TODAY  Essential hypertension Improving.  Blood pressure has responded to weight reduction and healthy lifestyle changes.  He denies feelings of lightheadedness or  dizziness but is currently taking valsartan /HCTZ 320/25 mg once daily and metoprolol  25 mg once daily, also for heart palpitations.  Continue active plan for weight reduction, reducing high sodium foods.  Reduce valsartan /HCTZ to one half tab once daily  Class 3 severe obesity due to excess calories with serious comorbidity and body mass index (BMI) of 40.0 to 44.9 in adult -     Tirzepatide -Weight Management; Inject 7.5 mg into the skin once a week.  Dispense: 2 mL; Refill: 0 Benefit outweighs the risk to continuing Zepbound , increasing 7.5 mg once weekly injection.  She feels improvement in appetite control and satiety without GI side effects.  A long-term plan may still include bariatric surgery.  She has an upcoming visit scheduled with [facility] to discuss.  OSA on CPAP Reports good compliance wearing CPAP nightly for OSA.  Continue active plan for weight reduction and aim for 7 to 8 hours of high-quality sleep at night  Prediabetes Lab Results  Component Value Date   HGBA1C 6.0 (H) 11/01/2023  Continue active plan for weight reduction.  She is now off metformin  as she has increased her dose of Zepbound .  Read labels on food and drink for added sugar and watch nutrition facts for commonly ordered restaurant foods.  She has room for improvement with regular exercise including both cardio and resistance training.  Goals reviewed on after visit summary  Other orders -     Valsartan -hydroCHLOROthiazide ; Take 0.5 tablets by mouth daily.      She was informed of the importance of frequent follow up visits to maximize her success with intensive lifestyle modifications for her multiple health conditions.   ATTESTASTION STATEMENTS:  Reviewed by clinician on day of visit: allergies, medications, problem list, medical history, surgical history, family history, social history, and previous encounter notes pertinent to obesity diagnosis.   I have personally spent 30 minutes total  time today in preparation, patient care, nutritional counseling and education,  and documentation for this visit, including the following: review of most recent clinical lab tests, prescribing medications/ refilling medications, reviewing medical assistant documentation, review and interpretation of bioimpedence results.     Micky Albee, D.O. DABFM, DABOM [facility] [address] [place], [place] [address] [phone]

## 2024-01-02 ENCOUNTER — Other Ambulatory Visit: Payer: Self-pay | Admitting: Medical-Surgical

## 2024-01-02 NOTE — Telephone Encounter (Signed)
 The pharmacy is requesting 90 day supply with quantity of 540.   Last refill was quantity 90 - 12/21/2023  Last office visit 10/25/2023  Upcoming appointment 04/28/2024

## 2024-01-11 DIAGNOSIS — R5383 Other fatigue: Secondary | ICD-10-CM | POA: Diagnosis not present

## 2024-01-11 DIAGNOSIS — Z79899 Other long term (current) drug therapy: Secondary | ICD-10-CM | POA: Diagnosis not present

## 2024-01-11 DIAGNOSIS — G4733 Obstructive sleep apnea (adult) (pediatric): Secondary | ICD-10-CM | POA: Diagnosis not present

## 2024-01-11 DIAGNOSIS — I10 Essential (primary) hypertension: Secondary | ICD-10-CM | POA: Diagnosis not present

## 2024-01-11 DIAGNOSIS — R6889 Other general symptoms and signs: Secondary | ICD-10-CM | POA: Diagnosis not present

## 2024-01-11 DIAGNOSIS — Z6841 Body Mass Index (BMI) 40.0 and over, adult: Secondary | ICD-10-CM | POA: Diagnosis not present

## 2024-01-11 DIAGNOSIS — Z8639 Personal history of other endocrine, nutritional and metabolic disease: Secondary | ICD-10-CM | POA: Diagnosis not present

## 2024-01-11 DIAGNOSIS — E785 Hyperlipidemia, unspecified: Secondary | ICD-10-CM | POA: Diagnosis not present

## 2024-01-11 DIAGNOSIS — E88819 Insulin resistance, unspecified: Secondary | ICD-10-CM | POA: Diagnosis not present

## 2024-01-18 ENCOUNTER — Other Ambulatory Visit: Payer: Self-pay | Admitting: Family Medicine

## 2024-01-18 DIAGNOSIS — Z6841 Body Mass Index (BMI) 40.0 and over, adult: Secondary | ICD-10-CM

## 2024-01-18 DIAGNOSIS — G4733 Obstructive sleep apnea (adult) (pediatric): Secondary | ICD-10-CM | POA: Diagnosis not present

## 2024-01-24 DIAGNOSIS — G4733 Obstructive sleep apnea (adult) (pediatric): Secondary | ICD-10-CM | POA: Diagnosis not present

## 2024-01-25 ENCOUNTER — Other Ambulatory Visit (HOSPITAL_COMMUNITY): Payer: Self-pay | Admitting: Surgery

## 2024-01-25 DIAGNOSIS — Z8639 Personal history of other endocrine, nutritional and metabolic disease: Secondary | ICD-10-CM

## 2024-01-25 DIAGNOSIS — Z6841 Body Mass Index (BMI) 40.0 and over, adult: Secondary | ICD-10-CM

## 2024-01-29 ENCOUNTER — Ambulatory Visit: Admitting: Skilled Nursing Facility1

## 2024-01-29 ENCOUNTER — Ambulatory Visit: Admitting: Family Medicine

## 2024-01-29 ENCOUNTER — Encounter: Payer: Self-pay | Admitting: Family Medicine

## 2024-01-29 VITALS — BP 137/85 | HR 66 | Temp 98.3°F | Ht 63.0 in | Wt 230.0 lb

## 2024-01-29 DIAGNOSIS — R7303 Prediabetes: Secondary | ICD-10-CM

## 2024-01-29 DIAGNOSIS — Z6841 Body Mass Index (BMI) 40.0 and over, adult: Secondary | ICD-10-CM | POA: Diagnosis not present

## 2024-01-29 DIAGNOSIS — E66813 Obesity, class 3: Secondary | ICD-10-CM

## 2024-01-29 DIAGNOSIS — I10 Essential (primary) hypertension: Secondary | ICD-10-CM

## 2024-01-29 DIAGNOSIS — G4733 Obstructive sleep apnea (adult) (pediatric): Secondary | ICD-10-CM

## 2024-01-29 DIAGNOSIS — Z8639 Personal history of other endocrine, nutritional and metabolic disease: Secondary | ICD-10-CM | POA: Diagnosis not present

## 2024-01-29 LAB — HEMOGLOBIN A1C: Hemoglobin A1C: 5.8

## 2024-01-29 MED ORDER — TIRZEPATIDE-WEIGHT MANAGEMENT 10 MG/0.5ML ~~LOC~~ SOLN: 10.0000 mg | SUBCUTANEOUS | 0 refills | Status: DC

## 2024-01-29 NOTE — Progress Notes (Signed)
 Office: [phone]  /  Fax: [phone]  WEIGHT SUMMARY AND BIOMETRICS  Starting Date: 04/30/23  Starting Weight: 252lb   Weight Lost Since Last Visit: 4lb   Vitals Temp: 98.3 F (36.8 C) BP: 137/85 Pulse Rate: 66 SpO2: 100 %   Body Composition  Body Fat %: 45.4 % Fat Mass (lbs): 104.4 lbs Muscle Mass (lbs): 119.2 lbs Total Body Water (lbs): 80.8 lbs Visceral Fat Rating : 14    HPI  Chief Complaint: OBESITY  Amy Diaz is here to discuss her progress with her obesity treatment plan. She is on the the Category 3 Plan and states she is following her eating plan approximately 70 % of the time. She states she is exercising 30 minutes 4 times per week.  Interval History:  Since last office visit she is down 4 lb This gives her a net weight loss of 22 pounds in the past 9 months of medically supervised weight management This is an 8.7% total body weight loss She did go up on Zepbound  to 7.5 mg weekly 3 weeks ago She has had more hard stools but improved appetite control (for more days of the week) She is occasionally meal skipping due to lack of appetite She is more mindful of protein intake and is getting more fruits in She met with Dr Lyndel 6/13 re: bariatric surgery consult She added in some treadmill walking at home She hosted a birthday party for her husband's 70th birthday this past weekend She meets with registered dietitian today and has been trying out protein shakes  Pharmacotherapy: Zepbound  7.5 mg once weekly injection, cash pay  PHYSICAL EXAM:  Blood pressure 137/85, pulse 66, temperature 98.3 F (36.8 C), height 5' 3 (1.6 m), weight 230 lb (104.3 kg), SpO2 100%. Body mass index is 40.74 kg/m.  General: She is overweight, cooperative, alert, well developed, and in no acute distress. PSYCH: Has normal mood, affect and thought process.   Lungs: Normal breathing effort, no conversational dyspnea.   ASSESSMENT AND PLAN  TREATMENT PLAN FOR  OBESITY:  Recommended Dietary Goals  Laruth is currently in the action stage of change. As such, her goal is to continue weight management plan. She has agreed to keeping a food journal and adhering to recommended goals of 1400 calories and 100 g of protein and practicing portion control and making smarter food choices, such as increasing vegetables and decreasing simple carbohydrates. REE: 1814 cal/ day on IC 04/30/23  Behavioral Intervention  We discussed the following Behavioral Modification Strategies today: increasing lean protein intake to established goals, increasing fiber rich foods, avoiding skipping meals, increasing water intake , keeping healthy foods at home, avoiding temptations and identifying enticing environmental cues, continue to practice mindfulness when eating, planning for success, and continue to work on maintaining a reduced calorie state, getting the recommended amount of protein, incorporating whole foods, making healthy choices, staying well hydrated and practicing mindfulness when eating.. - allow 2 fresh or frozen fruit servings daily  Additional resources provided today: NA  Recommended Physical Activity Goals  Madelyne has been advised to work up to 150 minutes of moderate intensity aerobic activity a week and strengthening exercises 2-3 times per week for cardiovascular health, weight loss maintenance and preservation of muscle mass.   She has agreed to Increase the intensity, frequency or duration of aerobic exercises   Increase walking time to 30+ minutes 5 days a week  Pharmacotherapy changes for the treatment of obesity: Increase Zepbound  to 10 mg once weekly injection  ASSOCIATED CONDITIONS ADDRESSED TODAY  OSA on CPAP She reports good compliance using CPAP nightly for OSA Aim for 7 to 8 hours of high-quality sleep using CPAP at night anticipate improvements in sleep apnea after weight loss surgery  Class 3 severe obesity due to excess calories with  serious comorbidity and body mass index (BMI) of 40.0 to 44.9 in adult -     Tirzepatide -Weight Management; Inject 10 mg into the skin once a week.  Dispense: 2 mL; Refill: 0 We discussed the importance of eating on a schedule incorporating a protein shake instead of meal skipping or adequate protein intake.  She has reduced her high intake of fruit and added in more vegetables daily as recommended.  She denies GI side effects using Zepbound . Increase dose to 10 mg once weekly injections with plan to discontinue 2 months prior to her planned bariatric surgery  Prediabetes Lab Results  Component Value Date   HGBA1C 6.0 (H) 11/01/2023  She is doing well with weight loss and dietary changes for the treatment of prediabetes.  Anticipate further improvements post bariatric surgery, currently on Zepbound  for obesity management.  Essential hypertension Blood pressure is fairly well-controlled on metoprolol  XL 25 mg once daily, losartan  HCTZ 320/25 mg one half tab once daily Follow-up with PCP for further management.  Look for improvements post bariatric surgery     She was informed of the importance of frequent follow up visits to maximize her success with intensive lifestyle modifications for her multiple health conditions.   ATTESTASTION STATEMENTS:  Reviewed by clinician on day of visit: allergies, medications, problem list, medical history, surgical history, family history, social history, and previous encounter notes pertinent to obesity diagnosis.   I have personally spent 30 minutes total time today in preparation, patient care, nutritional counseling and education,  and documentation for this visit, including the following: review of most recent clinical lab tests, prescribing medications/ refilling medications, reviewing medical assistant documentation, review and interpretation of bioimpedence results.     Darice Haddock, D.O. DABFM, DABOM [facility] [address] [place], [place] [address] [phone]

## 2024-01-30 ENCOUNTER — Encounter: Attending: Surgery | Admitting: Skilled Nursing Facility1

## 2024-01-30 ENCOUNTER — Encounter: Payer: Self-pay | Admitting: Skilled Nursing Facility1

## 2024-01-30 NOTE — Progress Notes (Signed)
 Nutrition Assessment for Bariatric Surgery: Pre-Surgery Behavioral and Nutrition Intervention Program   Medical Nutrition Therapy  Appt Start Time: 9:57    End Time: 11:00  Patient was seen on 01/30/2024 for Pre-Operative Nutrition Assessment. Purpose of todays visit  enhance perioperative outcomes along with a healthy weight maintenance   Referral stated Supervised Weight Loss (SWL) visits needed: 0: [facility]  Not cleared at this time:  Pt to follow up for minimum of one more visit to assist pt with progressing through stages of change/further nutrition education. RD advised pt that this follow up visit is not mandated through insurance. Pt verbalized agreement.  Pt to return for a minimum of 1 more visit to complete education as relationship with food was the main focus of today's visit  Planned surgery: Sleeve Gastrectomy  Pt expectation of surgery: to lose weight Pt expectation of dietitian:     NUTRITION ASSESSMENT   Anthropometrics  Start weight at [facility]: 234 lbs (date: 01/30/2024)  Height: 62.5 in BMI: 42.12 kg/m2     Clinical   Pt states her goal weight is 150-160 pounds.   Pharmacotherapy: History of weight loss medication used: Zepbound  currently past of 3+ other weight loss medications   Medical hx: GERD, OSA, hyperlipidemia, HTN, arthritis, allergies, glaucoma Medications: see list  Labs: WNL Notable signs/symptoms: Food feeling like it gets stuck  Any previous deficiencies? Yes: deficiency   Evaluation of Nutritional Deficiencies: Micronutrient Nutrition Focused Physical Exam: Hair: No issues observed Eyes: No issues observed Mouth: No issues observed Neck: No issues observed Nails: No issues observed Skin: No issues observed  Lifestyle & Dietary Hx  Pt states she wears her OSA device about 5 days a week.   Pt states she live sin [place].   Current Physical Activity Recommendations state 150 minutes per week of moderate to vigorous movement including  Cardio and 1-2 days of resistance activities as well as flexibility/balance activities:  Pts current physical activity: 3 days a week 30 minutes, with 60% recommendation reached   Sleep Hygiene: duration and quality: 5 hours most of the limited feeling rested   Current Patient Perceived Stress Level as stated by pt on a scale of 1-10: 6-7 due to being in the process of buying a home and having just moved her mother to a facility     Stress Management Techniques: pt states seh uses ice cream which she recognizes is not ideal   According to the Dietary Guidelines for Americans Recommendation: equivalent 1.5-2 cups fruits per day, equivalent 2-3 cups vegetables per day and at least half all grains whole  Fruit servings per day (on average): 4-5 times a day, meeting over 100% recommendation  Non-starchy vegetable servings per day (on average): 4+ times a week, meeting 20% recommendation  Whole Grains per day (on average): once over  3 weeks   Number of meals missed/skipped per week out of 21: 14+  24-Hr Dietary Recall First Meal:  Snack: fruit Second Meal:  Snack: fruit Third Meal:  Snack:  Beverages:   Alcoholic beverages per week:    Estimated Energy Needs Calories: 1600   NUTRITION DIAGNOSIS  Overweight/obesity ([place]-3.3) related to past poor dietary habits and physical inactivity as evidenced by patient w/ planned sleeve gastrectomy surgery following dietary guidelines for continued weight loss.    NUTRITION INTERVENTION  Nutrition counseling (C-1) and education (E-2) to facilitate bariatric surgery goals.  Educated pt on micronutrient deficiencies post-surgery and behavioral/dietary strategies to start in order to mitigate that risk  Behavioral and Dietary Interventions Pre-Op Goals Reviewed with the Patient Nutrition: Healthy Eating Behaviors Switch to non-caloric, non-carbonated and non-caffeinated beverages such as  water, unsweetened tea, Crystal Light and zero calorie  beverages (aim for 64 oz. per day) Cut out grazing between meals or at night  Find a protein shake you like Eat every 3-5 hours        Eliminate distractions while eating (TV, computer, reading, driving, texting) Take 79-69 minutes to eat a meal  Decrease high sugar foods/decrease high fat/fried foods Eliminate alcoholic beverages Increase protein intake (eggs, fish, chicken, yogurt) before surgery Eat non starchy vegetables 2 times a day 7 days a week Eat complex carbohydrates such as whole grains and fruits   Behavioral Modification: Physical Activity Increase my usual daily activity (use stairs, park farther, etc.) Engage in _______________________  activity  _______ minutes ______ times per week  Other:    _________________________________________________________________     Problem Solving I will think about my usual eating patterns and how to tweak them How can my friends and family support me Barriers to starting my changes Learn and understand appetite verses hunger   Healthy Coping Allow for ___________ activities per week to help me manage stress Reframe negative thoughts I will keep a picture of someone or something that is my inspiration & look at it daily   Monitoring  Weigh myself once a week  Measure my progress by monitoring how my clothes fit Keep a food record of what I eat and drink for the next ________ (time period) Take pictures of what I eat and drink for the next ________ (time period) Use an app to count steps/day for the next_______ (time period) Measure my progress such as increased energy and more restful sleep Monitor your acid reflux and bowel habits, are they getting better?   *Goals that are bolded indicate the pt would like to start working towards these  Handouts Provided Include  Bariatric Surgery handouts (Nutrition Visits, Pre Surgery Behavioral Change Goals, Protein Shakes Brands to Choose From, Vitamins & Mineral  Supplementation)  Learning Style & Readiness for Change Teaching method utilized: Visual, Auditory, and hands on  Demonstrated degree of understanding via: Teach Back  Readiness Level: contemplative  Barriers to learning/adherence to lifestyle change: unidentified   RD's Notes for Next Visit Discuss pre-operative information and eating throughout the day     MONITORING & EVALUATION Dietary intake, weekly physical activity, body weight, and preoperative behavioral change goals   Next Steps  Patient is to follow up at [facility] for additional nutrition education

## 2024-02-04 ENCOUNTER — Encounter (HOSPITAL_COMMUNITY): Admission: RE | Admit: 2024-02-04 | Source: Ambulatory Visit

## 2024-02-04 ENCOUNTER — Other Ambulatory Visit (HOSPITAL_COMMUNITY)

## 2024-02-05 ENCOUNTER — Ambulatory Visit: Admitting: Skilled Nursing Facility1

## 2024-02-06 ENCOUNTER — Ambulatory Visit (HOSPITAL_COMMUNITY)
Admission: RE | Admit: 2024-02-06 | Discharge: 2024-02-06 | Disposition: A | Discharge status: Home / Self Care | Source: Ambulatory Visit | Attending: Surgery | Admitting: Surgery

## 2024-02-06 DIAGNOSIS — Z01818 Encounter for other preprocedural examination: Secondary | ICD-10-CM | POA: Diagnosis not present

## 2024-02-06 DIAGNOSIS — Z8639 Personal history of other endocrine, nutritional and metabolic disease: Secondary | ICD-10-CM | POA: Insufficient documentation

## 2024-02-06 DIAGNOSIS — Z6841 Body Mass Index (BMI) 40.0 and over, adult: Secondary | ICD-10-CM | POA: Diagnosis not present

## 2024-02-07 DIAGNOSIS — Z1211 Encounter for screening for malignant neoplasm of colon: Secondary | ICD-10-CM | POA: Diagnosis not present

## 2024-02-07 DIAGNOSIS — Z01818 Encounter for other preprocedural examination: Secondary | ICD-10-CM | POA: Diagnosis not present

## 2024-02-07 DIAGNOSIS — K219 Gastro-esophageal reflux disease without esophagitis: Secondary | ICD-10-CM | POA: Diagnosis not present

## 2024-02-12 ENCOUNTER — Ambulatory Visit (HOSPITAL_COMMUNITY): Payer: Self-pay | Admitting: Licensed Clinical Social Worker

## 2024-02-17 DIAGNOSIS — G4733 Obstructive sleep apnea (adult) (pediatric): Secondary | ICD-10-CM | POA: Diagnosis not present

## 2024-02-19 ENCOUNTER — Ambulatory Visit: Admitting: Skilled Nursing Facility1

## 2024-02-21 ENCOUNTER — Encounter: Payer: Self-pay | Admitting: Skilled Nursing Facility1

## 2024-02-21 ENCOUNTER — Other Ambulatory Visit: Payer: Self-pay | Admitting: Family Medicine

## 2024-02-21 ENCOUNTER — Encounter: Attending: Surgery | Admitting: Skilled Nursing Facility1

## 2024-02-21 ENCOUNTER — Ambulatory Visit: Admitting: Skilled Nursing Facility1

## 2024-02-21 VITALS — Ht 62.5 in | Wt 229.0 lb

## 2024-02-21 DIAGNOSIS — E669 Obesity, unspecified: Secondary | ICD-10-CM | POA: Insufficient documentation

## 2024-02-21 DIAGNOSIS — Z6841 Body Mass Index (BMI) 40.0 and over, adult: Secondary | ICD-10-CM

## 2024-02-21 NOTE — Progress Notes (Signed)
 Bariatric Surgery: Pre-Surgery Behavioral and Nutrition Intervention Program   Medical Nutrition Therapy  Appt Start Time: 10:12    End Time: 10:50   Referral stated Supervised Weight Loss (SWL) visits needed: 0: [facility]  Pt completed visits.   Pt has cleared nutrition requirements.   Pt has completed visits. No further supervised visits required/recommended.  Planned surgery: Sleeve Gastrectomy  Pt expectation of surgery: to lose weight Pt expectation of dietitian:     NUTRITION ASSESSMENT   Anthropometrics  Start weight at [facility]: 234 lbs (date: 01/30/2024)  Height: 62.5 in Weight: 229 pounds BMI: 41.22   Clinical   Pt states her goal weight is 150-160 pounds.   Pharmacotherapy: History of weight loss medication used: Zepbound  currently past of 3+ other weight loss medications   Medical hx: GERD, OSA, hyperlipidemia, HTN, arthritis, allergies, glaucoma Medications: see list  Labs: WNL Notable signs/symptoms: Food feeling like it gets stuck  Any previous deficiencies? Yes: deficiency   Evaluation of Nutritional Deficiencies: Micronutrient Nutrition Focused Physical Exam: Hair: No issues observed Eyes: No issues observed Mouth: No issues observed Neck: No issues observed Nails: No issues observed Skin: No issues observed  Lifestyle & Dietary Hx  Pt states she wears her OSA device about 5 days a week.   Pt states she live sin [place].   Pt states she has been feeling tired lately so started a B12. Pt states she taking a protein shake every morning. Pt states she has cut back on ice cream sandwiches. Pt states she was mindful with her snacks ad realized she didn't even want it such as a reese cup.  Pt states she has mastered drinking water throughout the day.  Pt states she has been walking more often realizing it is not so bad after a while.  Pt states her husband is her support person.   Current Physical Activity Recommendations state 150 minutes per week  of moderate to vigorous movement including Cardio and 1-2 days of resistance activities as well as flexibility/balance activities:  Pts current physical activity: 5 days a week 30 minutes, with 100% recommendation reached   Sleep Hygiene: duration and quality: 5 hours most of the limited feeling rested   Current Patient Perceived Stress Level as stated by pt on a scale of 1-10: 6-7 due to being in the process of buying a home and having just moved her mother to a facility     Stress Management Techniques: pt states seh uses ice cream which she recognizes is not ideal   According to the Dietary Guidelines for Americans Recommendation: equivalent 1.5-2 cups fruits per day, equivalent 2-3 cups vegetables per day and at least half all grains whole  Fruit servings per day (on average): 4-5 times a day, meeting over 100% recommendation  Non-starchy vegetable servings per day (on average): 4+ times a week, meeting 20% recommendation  Whole Grains per day (on average): once over  3 weeks   Number of meals missed/skipped per week out of 21: 9+  24-Hr Dietary Recall First Meal: protein shake Snack: fruit Second Meal:  Snack: fruit Third Meal:  Snack:  Beverages:   Alcoholic beverages per week: 0   Estimated Energy Needs Calories: 1600   NUTRITION DIAGNOSIS  Overweight/obesity ([place]-3.3) related to past poor dietary habits and physical inactivity as evidenced by patient w/ planned sleeve gastrectomy surgery following dietary guidelines for continued weight loss.    NUTRITION INTERVENTION  Nutrition counseling (C-1) and education (E-2) to facilitate bariatric surgery goals.  Educated pt on micronutrient deficiencies post-surgery and behavioral/dietary strategies to start in order to mitigate that risk   Behavioral and Dietary Interventions Pre-Op Goals Reviewed with the Patient Nutrition: Healthy Eating Behaviors Switch to non-caloric, non-carbonated and non-caffeinated beverages such as   water, unsweetened tea, Crystal Light and zero calorie beverages (aim for 64 oz. per day) Cut out grazing between meals or at night  Find a protein shake you like Eat every 3-5 hours        Eliminate distractions while eating (TV, computer, reading, driving, texting) Take 79-69 minutes to eat a meal  Decrease high sugar foods/decrease high fat/fried foods Eliminate alcoholic beverages Increase protein intake (eggs, fish, chicken, yogurt) before surgery Eat non starchy vegetables 2 times a day 7 days a week Eat complex carbohydrates such as whole grains and fruits   Behavioral Modification: Physical Activity Increase my usual daily activity (use stairs, park farther, etc.) Engage in _______________________  activity  _______ minutes ______ times per week  Other:    _________________________________________________________________     Problem Solving I will think about my usual eating patterns and how to tweak them How can my friends and family support me Barriers to starting my changes Learn and understand appetite verses hunger   Healthy Coping Allow for ___________ activities per week to help me manage stress Reframe negative thoughts I will keep a picture of someone or something that is my inspiration & look at it daily   Monitoring  Weigh myself once a week  Measure my progress by monitoring how my clothes fit Keep a food record of what I eat and drink for the next ________ (time period) Take pictures of what I eat and drink for the next ________ (time period) Use an app to count steps/day for the next_______ (time period) Measure my progress such as increased energy and more restful sleep Monitor your acid reflux and bowel habits, are they getting better?   *Goals that are bolded indicate the pt would like to start working towards these  Handouts Previously Provided Include  Bariatric Surgery handouts (Nutrition Visits, Pre Surgery Behavioral Change Goals, Protein Shakes  Brands to Choose From, Vitamins & Mineral Supplementation)  Learning Style & Readiness for Change Teaching method utilized: Visual, Auditory, and hands on  Demonstrated degree of understanding via: Teach Back  Readiness Level: action Barriers to learning/adherence to lifestyle change: none identified      MONITORING & EVALUATION Dietary intake, weekly physical activity, body weight, and preoperative behavioral change goals   Next Steps  Patient is to follow up at [facility] for pre-op calss

## 2024-02-24 ENCOUNTER — Other Ambulatory Visit: Payer: Self-pay | Admitting: Medical-Surgical

## 2024-02-24 DIAGNOSIS — I10 Essential (primary) hypertension: Secondary | ICD-10-CM

## 2024-02-25 ENCOUNTER — Other Ambulatory Visit: Payer: Self-pay

## 2024-02-25 ENCOUNTER — Other Ambulatory Visit (HOSPITAL_COMMUNITY): Payer: Self-pay

## 2024-02-25 MED ORDER — CETIRIZINE HCL 10 MG PO TABS
10.0000 mg | ORAL_TABLET | Freq: Every day | ORAL | 1 refills | Status: DC
  Filled 2024-02-25: qty 90, 90d supply, fill #0
  Filled 2024-07-12 – 2024-07-14 (×2): qty 90, 90d supply, fill #1

## 2024-02-25 MED ORDER — METOPROLOL SUCCINATE ER 25 MG PO TB24
25.0000 mg | ORAL_TABLET | Freq: Every day | ORAL | 0 refills | Status: AC
Start: 2024-02-25 — End: ?
  Filled 2024-02-25: qty 90, 90d supply, fill #0

## 2024-02-25 MED ORDER — OMEPRAZOLE 40 MG PO CPDR
40.0000 mg | DELAYED_RELEASE_CAPSULE | Freq: Every day | ORAL | 0 refills | Status: DC
  Filled 2024-02-25: qty 90, 90d supply, fill #0

## 2024-03-03 ENCOUNTER — Telehealth: Admitting: Physician Assistant

## 2024-03-03 DIAGNOSIS — R21 Rash and other nonspecific skin eruption: Secondary | ICD-10-CM

## 2024-03-03 NOTE — Patient Instructions (Addendum)
 Lajuana Nett, thank you for joining Teena Shuck, PA-C for today's virtual visit.  While this provider is not your primary care provider (PCP), if your PCP is located in our provider database this encounter information will be shared with them immediately following your visit.   A [facility] MyChart account gives you access to today's visit and all your visits, tests, and labs performed at [facility]  click here if you don't have a [facility] MyChart account or go to [email or web address]  Consent: (Patient) Amy Diaz provided verbal consent for this virtual visit at the beginning of the encounter.  Current Medications:  Current Outpatient Medications:    Albuterol-Budesonide (AIRSUPRA ) 90-80 MCG/ACT AERO, Inhale 2 puffs into the lungs every 6 (six) hours as needed., Disp: 1 g, Rfl: 11   AMBULATORY NON FORMULARY MEDICATION, Continuous positive airway pressure (CPAP) machine set on AutoPAP (4-20 cmH2O), with all supplemental supplies as needed., Disp: 1 each, Rfl: 0   aspirin  EC 81 MG tablet, Take 1 tablet (81 mg total) by mouth daily. Swallow whole., Disp: 90 tablet, Rfl: 3   atorvastatin  (LIPITOR) 10 MG tablet, Take 1 tablet (10 mg total) by mouth daily., Disp: 90 tablet, Rfl: 3   Brimonidine Tartrate (LUMIFY) 0.025 % SOLN, Place 1 drop into both eyes in the morning., Disp: , Rfl:    busPIRone  (BUSPAR ) 5 MG tablet, TAKE 1-3 TABLETS (5-15 MG TOTAL) BY MOUTH 2 (TWO) TIMES DAILY AS NEEDED., Disp: 540 tablet, Rfl: 1   celecoxib  (CELEBREX ) 100 MG capsule, Take 1 capsule (100 mg total) by mouth daily., Disp: 90 capsule, Rfl: 0   cetirizine  (ZYRTEC ) 10 MG tablet, Take 1 tablet (10 mg total) by mouth daily., Disp: 90 tablet, Rfl: 1   famotidine  (PEPCID ) 40 MG tablet, Take 1 tablet (40 mg total) by mouth at bedtime., Disp: 90 tablet, Rfl: 1   Fluocinolone  Acetonide Body 0.01 % OIL, Apply 1 application  topically daily., Disp: 118.28 mL, Rfl: 2   furosemide  (LASIX ) 20 MG tablet,  Take 1 tablet (20 mg total) by mouth daily as needed., Disp: 30 tablet, Rfl: 1   hydrOXYzine  (ATARAX ) 10 MG tablet, Take 1-2 tablets (10-20 mg total) by mouth 3 (three) times daily as needed., Disp: 180 tablet, Rfl: 1   methocarbamol  (ROBAXIN ) 500 MG tablet, Take 1 tablet (500 mg total) by mouth 3 (three) times daily. (Patient taking differently: Take 500 mg by mouth in the morning.), Disp: 90 tablet, Rfl: 0   metoprolol  succinate (TOPROL -XL) 25 MG 24 hr tablet, Take 1 tablet (25 mg total) by mouth daily., Disp: 90 tablet, Rfl: 0   Multiple Vitamin (MULTIVITAMIN) tablet, Take 1 tablet by mouth in the morning., Disp: , Rfl:    omeprazole  (PRILOSEC) 40 MG capsule, Take 1 capsule (40 mg total) by mouth daily before supper., Disp: 90 capsule, Rfl: 0   predniSONE  (STERAPRED UNI-PAK 21 TAB) 10 MG (21) TBPK tablet, Take as directed, Disp: 21 tablet, Rfl: 0   promethazine -dextromethorphan (PROMETHAZINE -DM) 6.25-15 MG/5ML syrup, Take 5 mLs by mouth 4 (four) times daily as needed for cough., Disp: 118 mL, Rfl: 0   Propylene Glycol (SYSTANE COMPLETE) 0.6 % SOLN, Place 1 drop into both eyes at bedtime., Disp: , Rfl:    tirzepatide  10 MG/0.5ML injection vial, Inject 10 mg into the skin once a week., Disp: 2 mL, Rfl: 0   traZODone  (DESYREL ) 50 MG tablet, Take 0.5-1 tablets (25-50 mg total) by mouth at bedtime as needed for sleep., Disp: 90 tablet, Rfl: 1  valsartan -hydrochlorothiazide  (DIOVAN -HCT) 320-25 MG tablet, Take 0.5 tablets by mouth daily., Disp: , Rfl:    VITAMIN D  PO, Take 4,000 Units by mouth in the morning., Disp: , Rfl:    Medications ordered in this encounter:  No orders of the defined types were placed in this encounter.    *If you need refills on other medications prior to your next appointment, please contact your pharmacy*  Follow-Up: Call back or seek an in-person evaluation if the symptoms worsen or if the condition fails to improve as anticipated.  [facility] [phone]  Other Instructions Please report to the nearest Urgent Care for evaluation.   If you have been instructed to have an in-person evaluation today at a local Urgent Care facility, please use the link below. It will take you to a list of all of our available [facility], including address, phone number and hours of operation. Please do not delay care.  [facility]  If you or a family member do not have a primary care provider, use the link below to schedule a visit and establish care. When you choose a [facility] primary care physician or advanced practice provider, you gain a long-term partner in health. Find a Primary Care Provider  Learn more about [facility] in-office and virtual care options: [facility] - Get Care Now

## 2024-03-03 NOTE — Progress Notes (Signed)
 Virtual Visit Consent   Aarti Mankowski, you are scheduled for a virtual visit with a [facility] provider today. Just as with appointments in the office, your consent must be obtained to participate. Your consent will be active for this visit and any virtual visit you may have with one of our providers in the next 365 days. If you have a MyChart account, a copy of this consent can be sent to you electronically.  As this is a virtual visit, video technology does not allow for your provider to perform a traditional examination. This may limit your provider's ability to fully assess your condition. If your provider identifies any concerns that need to be evaluated in person or the need to arrange testing (such as labs, EKG, etc.), we will make arrangements to do so. Although advances in technology are sophisticated, we cannot ensure that it will always work on either your end or our end. If the connection with a video visit is poor, the visit may have to be switched to a telephone visit. With either a video or telephone visit, we are not always able to ensure that we have a secure connection.  By engaging in this virtual visit, you consent to the provision of healthcare and authorize for your insurance to be billed (if applicable) for the services provided during this visit. Depending on your insurance coverage, you may receive a charge related to this service.  I need to obtain your verbal consent now. Are you willing to proceed with your visit today? Amy Diaz has provided verbal consent on 03/03/2024 for a virtual visit (video or telephone). Teena Shuck, [place]  Date: 03/03/2024 3:38 PM   Virtual Visit via Video Note   I, Teena Shuck, connected with  Amy Diaz  ([id number], 10/21/1970) on 03/03/24 at  3:30 PM EDT by a video-enabled telemedicine application and verified that I am speaking with the correct person using two identifiers.  Location: Patient: Virtual Visit Location Patient:  Home Provider: Virtual Visit Location Provider: Home Office   I discussed the limitations of evaluation and management by telemedicine and the availability of in person appointments. The patient expressed understanding and agreed to proceed.    History of Present Illness: Amy Diaz is a 53 y.o. who identifies as a female who was assigned female at birth, and is being seen today for rash in groin which she is unable to show at this time. SABRA  HPI: HPI  Problems:  Patient Active Problem List   Diagnosis Date Noted   Cutaneous abscess of abdominal wall 08/24/2023   OSA on CPAP 07/02/2023   Insulin  resistance 05/15/2023   Vitamin D  deficiency 05/15/2023   Other fatigue 04/30/2023   SOBOE (shortness of breath on exertion) 04/30/2023   Hyperlipidemia 04/30/2023   DDD (degenerative disc disease), lumbar 04/30/2023   Polyphagia 04/30/2023   Depression screen 04/30/2023   BMI 40.0-44.9, adult (HCC) 04/30/2023   Loud snoring 01/23/2023   Person encountering health services to consult on behalf of another person 05/11/2022   Bilateral hand pain 05/02/2022   Primary osteoarthritis of both knees 02/20/2022   Prediabetes 11/03/2019   Morbid obesity (HCC) 11/03/2019   Insomnia 11/03/2019   Fall 11/03/2019   Essential hypertension 11/03/2019    Allergies:  Allergies  Allergen Reactions   Lisinopril Cough   Sumatriptan Other (See Comments)    chest tightness   Medications:  Current Outpatient Medications:    Albuterol-Budesonide (AIRSUPRA ) 90-80 MCG/ACT AERO, Inhale 2 puffs into the lungs every  6 (six) hours as needed., Disp: 1 g, Rfl: 11   AMBULATORY NON FORMULARY MEDICATION, Continuous positive airway pressure (CPAP) machine set on AutoPAP (4-20 cmH2O), with all supplemental supplies as needed., Disp: 1 each, Rfl: 0   aspirin  EC 81 MG tablet, Take 1 tablet (81 mg total) by mouth daily. Swallow whole., Disp: 90 tablet, Rfl: 3   atorvastatin  (LIPITOR) 10 MG tablet, Take 1 tablet (10 mg  total) by mouth daily., Disp: 90 tablet, Rfl: 3   Brimonidine Tartrate (LUMIFY) 0.025 % SOLN, Place 1 drop into both eyes in the morning., Disp: , Rfl:    busPIRone  (BUSPAR ) 5 MG tablet, TAKE 1-3 TABLETS (5-15 MG TOTAL) BY MOUTH 2 (TWO) TIMES DAILY AS NEEDED., Disp: 540 tablet, Rfl: 1   celecoxib  (CELEBREX ) 100 MG capsule, Take 1 capsule (100 mg total) by mouth daily., Disp: 90 capsule, Rfl: 0   cetirizine  (ZYRTEC ) 10 MG tablet, Take 1 tablet (10 mg total) by mouth daily., Disp: 90 tablet, Rfl: 1   famotidine  (PEPCID ) 40 MG tablet, Take 1 tablet (40 mg total) by mouth at bedtime., Disp: 90 tablet, Rfl: 1   Fluocinolone  Acetonide Body 0.01 % OIL, Apply 1 application  topically daily., Disp: 118.28 mL, Rfl: 2   furosemide  (LASIX ) 20 MG tablet, Take 1 tablet (20 mg total) by mouth daily as needed., Disp: 30 tablet, Rfl: 1   hydrOXYzine  (ATARAX ) 10 MG tablet, Take 1-2 tablets (10-20 mg total) by mouth 3 (three) times daily as needed., Disp: 180 tablet, Rfl: 1   methocarbamol  (ROBAXIN ) 500 MG tablet, Take 1 tablet (500 mg total) by mouth 3 (three) times daily. (Patient taking differently: Take 500 mg by mouth in the morning.), Disp: 90 tablet, Rfl: 0   metoprolol  succinate (TOPROL -XL) 25 MG 24 hr tablet, Take 1 tablet (25 mg total) by mouth daily., Disp: 90 tablet, Rfl: 0   Multiple Vitamin (MULTIVITAMIN) tablet, Take 1 tablet by mouth in the morning., Disp: , Rfl:    omeprazole  (PRILOSEC) 40 MG capsule, Take 1 capsule (40 mg total) by mouth daily before supper., Disp: 90 capsule, Rfl: 0   predniSONE  (STERAPRED UNI-PAK 21 TAB) 10 MG (21) TBPK tablet, Take as directed, Disp: 21 tablet, Rfl: 0   promethazine -dextromethorphan (PROMETHAZINE -DM) 6.25-15 MG/5ML syrup, Take 5 mLs by mouth 4 (four) times daily as needed for cough., Disp: 118 mL, Rfl: 0   Propylene Glycol (SYSTANE COMPLETE) 0.6 % SOLN, Place 1 drop into both eyes at bedtime., Disp: , Rfl:    tirzepatide  10 MG/0.5ML injection vial, Inject 10 mg  into the skin once a week., Disp: 2 mL, Rfl: 0   traZODone  (DESYREL ) 50 MG tablet, Take 0.5-1 tablets (25-50 mg total) by mouth at bedtime as needed for sleep., Disp: 90 tablet, Rfl: 1   valsartan -hydrochlorothiazide  (DIOVAN -HCT) 320-25 MG tablet, Take 0.5 tablets by mouth daily., Disp: , Rfl:    VITAMIN D  PO, Take 4,000 Units by mouth in the morning., Disp: , Rfl:   Observations/Objective: Patient is well-developed, well-nourished in no acute distress.  Resting comfortably  at home.  Head is normocephalic, atraumatic.  No labored breathing. Speech is clear and coherent with logical content.  Patient is alert and oriented at baseline.    Assessment and Plan: 1. Rash of groin (Primary)  Patient with rash in her groin area and I am unable to visualize it at this time as it is close proximity to her pubic area. Advised that she will need to be seen in person. No  acute or emergent conditions noted during appointment.  Follow Up Instructions: I discussed the assessment and treatment plan with the patient. The patient was provided an opportunity to ask questions and all were answered. The patient agreed with the plan and demonstrated an understanding of the instructions.  A copy of instructions were sent to the patient via MyChart unless otherwise noted below.     The patient was advised to call back or seek an in-person evaluation if the symptoms worsen or if the condition fails to improve as anticipated.    Teena Shuck, PA-C

## 2024-03-04 ENCOUNTER — Encounter: Payer: Self-pay | Admitting: Family Medicine

## 2024-03-04 ENCOUNTER — Ambulatory Visit: Admitting: Family Medicine

## 2024-03-04 ENCOUNTER — Ambulatory Visit
Admission: EM | Admit: 2024-03-04 | Discharge: 2024-03-04 | Disposition: A | Discharge status: Home / Self Care | Attending: Family Medicine | Admitting: Family Medicine

## 2024-03-04 ENCOUNTER — Ambulatory Visit (HOSPITAL_COMMUNITY): Payer: Self-pay | Admitting: Licensed Clinical Social Worker

## 2024-03-04 VITALS — BP 123/83 | HR 76 | Temp 98.2°F | Ht 62.5 in | Wt 224.0 lb

## 2024-03-04 DIAGNOSIS — Z6841 Body Mass Index (BMI) 40.0 and over, adult: Secondary | ICD-10-CM

## 2024-03-04 DIAGNOSIS — R7303 Prediabetes: Secondary | ICD-10-CM | POA: Diagnosis not present

## 2024-03-04 DIAGNOSIS — E66813 Obesity, class 3: Secondary | ICD-10-CM

## 2024-03-04 DIAGNOSIS — G4733 Obstructive sleep apnea (adult) (pediatric): Secondary | ICD-10-CM

## 2024-03-04 DIAGNOSIS — F4320 Adjustment disorder, unspecified: Secondary | ICD-10-CM

## 2024-03-04 DIAGNOSIS — R21 Rash and other nonspecific skin eruption: Secondary | ICD-10-CM | POA: Diagnosis not present

## 2024-03-04 DIAGNOSIS — B372 Candidiasis of skin and nail: Secondary | ICD-10-CM

## 2024-03-04 DIAGNOSIS — I10 Essential (primary) hypertension: Secondary | ICD-10-CM

## 2024-03-04 MED ORDER — METHYLPREDNISOLONE SODIUM SUCC 125 MG IJ SOLR
125.0000 mg | Freq: Once | INTRAMUSCULAR | Status: AC
  Administered 2024-03-04: 125 mg via INTRAMUSCULAR

## 2024-03-04 MED ORDER — NYSTATIN-TRIAMCINOLONE 100000-0.1 UNIT/GM-% EX CREA
TOPICAL_CREAM | CUTANEOUS | 0 refills | Status: DC
Start: 2024-03-04 — End: 2024-05-15

## 2024-03-04 NOTE — ED Triage Notes (Addendum)
 Pt presents to [facility] with co rash in groin area for 2 weeks that is itchy in nature. Pt reports she went to the pool and has had it ever since. She thinks this is yeast and she has been using otc medications with no improvement.

## 2024-03-04 NOTE — Progress Notes (Signed)
 Office: [phone]  /  Fax: [phone]  WEIGHT SUMMARY AND BIOMETRICS  Starting Date: 04/30/23  Starting Weight: 252lb   Weight Lost Since Last Visit: 6lb   Vitals Temp: 98 F (36.7 C) BP: 112/77 Pulse Rate: 75 SpO2: 98 %   Body Composition  Body Fat %: 44.8 % Fat Mass (lbs): 100.4 lbs Muscle Mass (lbs): 117.6 lbs Total Body Water (lbs): 78.6 lbs Visceral Fat Rating : 14   HPI  Chief Complaint: OBESITY  Amy Diaz is here to discuss her progress with her obesity treatment plan. She is on the the Category 3 Plan and states she is following her eating plan approximately 80 % of the time. She states she is exercising 30 minutes 3-4 times per week.  Interval History:  Since last office visit she is down 6 lb This gives her a net weight loss of 28 lb in the past 10 mos of medically supervised weight management This is an 11% total body weight loss She is down 1.6 lb of muscle mass and down 4 lb of body fat since last visit She is preparing for bariatric surgery (VSG) with Dr Lyndel She is earing her CPAP nightly She has been focused of lean protein and veggie intake She has a good support system at home She has upcoming EGD/ colonoscopy 8/26 She has been using Zepbound  10 mg vials, cash pay  Pharmacotherapy: none  PHYSICAL EXAM:  Blood pressure 123/83, pulse 76, temperature 98.2 F (36.8 C), height 5' 2.5 (1.588 m), weight 224 lb (101.6 kg), SpO2 100%. Body mass index is 40.32 kg/m.  General: She is overweight, cooperative, alert, well developed, and in no acute distress. PSYCH: Has normal mood, affect and thought process.   Lungs: Normal breathing effort, no conversational dyspnea.   ASSESSMENT AND PLAN  TREATMENT PLAN FOR OBESITY:  Recommended Dietary Goals  Amy Diaz is currently in the action stage of change. As such, her goal is to continue weight management plan. She has agreed to practicing portion control and making smarter food choices, such  as increasing vegetables and decreasing simple carbohydrates. Work on dietary change goals outlined by RD in preparation for upcoming vertical sleeve gastrectomy  Behavioral Intervention  We discussed the following Behavioral Modification Strategies today: increasing lean protein intake to established goals, avoiding skipping meals, reading food labels , keeping healthy foods at home, work on managing stress, creating time for self-care and relaxation, continue to practice mindfulness when eating, planning for success, and continue to work on maintaining a reduced calorie state, getting the recommended amount of protein, incorporating whole foods, making healthy choices, staying well hydrated and practicing mindfulness when eating..  Additional resources provided today: NA  Recommended Physical Activity Goals  Amy Diaz has been advised to work up to 150 minutes of moderate intensity aerobic activity a week and strengthening exercises 2-3 times per week for cardiovascular health, weight loss maintenance and preservation of muscle mass.   She has agreed to Work on scheduling and tracking physical activity.  Firm up plan for postop exercise to include both cardio and resistance training 3 to 5 days a week once cleared by surgeon  Pharmacotherapy changes for the treatment of obesity: Discontinue Zepbound  in preparation for upcoming EGD/colonoscopy and bariatric surgery  ASSOCIATED CONDITIONS ADDRESSED TODAY  OSA on CPAP Improving using CPAP nightly.  Continue CPAP with a goal of 8 hours of high-quality sleep at night.  Continue active plan for weight reduction for improvements post bariatric surgery  Class 3 severe obesity  due to excess calories with serious comorbidity and body mass index (BMI) of 40.0 to 44.9 in adult Improving.  Patient has an 11% total body weight loss in the first 10 months of medically supervised weight management with a long-term plan to move forward with vertical sleeve  gastrectomy at [facility] surgery.  She is looking forward to a long-term solution as she comes off Zepbound .  She has been making healthy lifestyle changes in preparation for surgery and has a good support system.  Prediabetes Lab Results  Component Value Date   HGBA1C 6.0 (H) 11/01/2023  Plan to recheck A1c post-bariatric surgery looking for improvement.  She has already done better with dietary change, weight loss and regular exercise over the past 10 months medically supervised weight management.  Consider use of metformin  if A1c does not improve post bariatric surgery.  Essential hypertension Blood pressure is well-controlled currently on Diovan  HCT 320/25 mg, one half tab daily, metoprolol  XL 25 mg once daily.  Look for further improvements post bariatric surgery with further weight loss.     She was informed of the importance of frequent follow up visits to maximize her success with intensive lifestyle modifications for her multiple health conditions.   ATTESTASTION STATEMENTS:  Reviewed by clinician on day of visit: allergies, medications, problem list, medical history, surgical history, family history, social history, and previous encounter notes pertinent to obesity diagnosis.   I have personally spent 30 minutes total time today in preparation, patient care, nutritional counseling and education,  and documentation for this visit, including the following: review of most recent clinical lab tests, discontinuing prescription medications, reviewing medical assistant documentation, review and interpretation of bioimpedence results.     Darice Haddock, D.O. DABFM, DABOM [facility] [address] [place], [place] [address] [phone]

## 2024-03-04 NOTE — ED Provider Notes (Signed)
 Amy Diaz [facility]    CSN: [id number] Arrival date & time: 03/04/24  1011      History   Chief Complaint Chief Complaint  Patient presents with   Rash    HPI Amy Diaz is a 53 y.o. female.   HPI pleasant 53 year old female presents with rash in groin for 2 weeks that is pruritic in nature.  Patient reports going to swimming pool and having rash ever since.  Patient believes this is a yeast infection.  PMH significant for morbid obesity, insulin  resistance, and OSA currently on CPAP.  Past Medical History:  Diagnosis Date   Abnormal mammogram    Allergy    on zyrtec - seasonal   Arthritis    back   Back pain    Back pain    Dysrhythmia    Edema    Enthesopathy    GERD (gastroesophageal reflux disease)    Glaucoma    no drops so far early onset per pt   H/O degenerative disc disease    Heartburn    High cholesterol    Hypertension    Joint pain    Prediabetes    Sleep apnea    no OSA per pt per 2 sleep studies   Swelling     Patient Active Problem List   Diagnosis Date Noted   Cutaneous abscess of abdominal wall 08/24/2023   OSA on CPAP 07/02/2023   Insulin  resistance 05/15/2023   Vitamin D  deficiency 05/15/2023   Other fatigue 04/30/2023   SOBOE (shortness of breath on exertion) 04/30/2023   Hyperlipidemia 04/30/2023   DDD (degenerative disc disease), lumbar 04/30/2023   Polyphagia 04/30/2023   Depression screen 04/30/2023   BMI 40.0-44.9, adult (HCC) 04/30/2023   Loud snoring 01/23/2023   Person encountering health services to consult on behalf of another person 05/11/2022   Bilateral hand pain 05/02/2022   Primary osteoarthritis of both knees 02/20/2022   Prediabetes 11/03/2019   Morbid obesity (HCC) 11/03/2019   Insomnia 11/03/2019   Fall 11/03/2019   Essential hypertension 11/03/2019    Past Surgical History:  Procedure Laterality Date   CARPAL TUNNEL RELEASE Right    CESAREAN SECTION     x1   CYST REMOVAL TRUNK     ECTOPIC  PREGNANCY SURGERY     MASS EXCISION Right 09/25/2023   Procedure: EXCISION RIGHT CHEST SEBACEOUS CYST;  Surgeon: Lyndel Deward PARAS, MD;  Location: [facility];  Service: General;  Laterality: Right;  90   TOTAL ABDOMINAL HYSTERECTOMY     WISDOM TOOTH EXTRACTION      OB History     Gravida  5   Para  5   Term  5   Preterm      AB      Living         SAB      IAB      Ectopic      Multiple      Live Births               Home Medications    Prior to Admission medications   Medication Sig Start Date End Date Taking? Authorizing Provider  busPIRone  (BUSPAR ) 5 MG tablet TAKE 1-3 TABLETS (5-15 MG TOTAL) BY MOUTH 2 (TWO) TIMES DAILY AS NEEDED. 01/02/24  Yes Willo Mini, NP  nystatin -triamcinolone  (MYCOLOG II) cream Apply to affected area twice daily daily x 7 to 10 days 03/04/24  Yes Aadhya Bustamante, FNP  Albuterol-Budesonide (AIRSUPRA ) 90-80 MCG/ACT AERO Inhale 2  puffs into the lungs every 6 (six) hours as needed. 10/25/23   Willo Mini, NP  AMBULATORY NON FORMULARY MEDICATION Continuous positive airway pressure (CPAP) machine set on AutoPAP (4-20 cmH2O), with all supplemental supplies as needed. 06/20/23   Willo Mini, NP  aspirin  EC 81 MG tablet Take 1 tablet (81 mg total) by mouth daily. Swallow whole. 07/03/23   Willo Mini, NP  atorvastatin  (LIPITOR) 10 MG tablet Take 1 tablet (10 mg total) by mouth daily. 08/17/23   Willo Mini, NP  Brimonidine Tartrate (LUMIFY) 0.025 % SOLN Place 1 drop into both eyes in the morning.    [provider]  celecoxib  (CELEBREX ) 100 MG capsule Take 1 capsule (100 mg total) by mouth daily. 12/13/23   Willo Mini, NP  cetirizine  (ZYRTEC ) 10 MG tablet Take 1 tablet (10 mg total) by mouth daily. 02/25/24   Willo Mini, NP  famotidine  (PEPCID ) 40 MG tablet Take 1 tablet (40 mg total) by mouth at bedtime. 02/07/22   Willo Mini, NP  Fluocinolone  Acetonide Body 0.01 % OIL Apply 1 application  topically daily. 09/11/22   Willo Mini, NP  furosemide   (LASIX ) 20 MG tablet Take 1 tablet (20 mg total) by mouth daily as needed. 05/15/22   Willo Mini, NP  hydrOXYzine  (ATARAX ) 10 MG tablet Take 1-2 tablets (10-20 mg total) by mouth 3 (three) times daily as needed. 10/25/23   Willo Mini, NP  methocarbamol  (ROBAXIN ) 500 MG tablet Take 1 tablet (500 mg total) by mouth 3 (three) times daily. Patient taking differently: Take 500 mg by mouth in the morning. 08/17/23   Willo Mini, NP  metoprolol  succinate (TOPROL -XL) 25 MG 24 hr tablet Take 1 tablet (25 mg total) by mouth daily. 02/25/24   Willo Mini, NP  Multiple Vitamin (MULTIVITAMIN) tablet Take 1 tablet by mouth in the morning.    [provider]  omeprazole  (PRILOSEC) 40 MG capsule Take 1 capsule (40 mg total) by mouth daily before supper. 02/25/24   Willo Mini, NP  promethazine -dextromethorphan (PROMETHAZINE -DM) 6.25-15 MG/5ML syrup Take 5 mLs by mouth 4 (four) times daily as needed for cough. 12/19/23   Moishe Chiquita HERO, NP  Propylene Glycol (SYSTANE COMPLETE) 0.6 % SOLN Place 1 drop into both eyes at bedtime.    [provider]  traZODone  (DESYREL ) 50 MG tablet Take 0.5-1 tablets (25-50 mg total) by mouth at bedtime as needed for sleep. 05/09/23   Willo Mini, NP  valsartan -hydrochlorothiazide  (DIOVAN -HCT) 320-25 MG tablet Take 0.5 tablets by mouth daily. 12/31/23   Bowen, Darice BRAVO, DO  VITAMIN D  PO Take 4,000 Units by mouth in the morning.    [provider]    Family History Family History  Problem Relation Age of Onset   Hyperlipidemia Mother    Thyroid disease Mother    Hypertension Mother    Hyperlipidemia Father    Cancer Father    Hypertension Father    Prostate cancer Father    Anxiety disorder Father    Colon polyps Maternal Aunt    Colon polyps Maternal Aunt    Colon polyps Cousin    Colon polyps Cousin    Colon cancer Neg Hx    Esophageal cancer Neg Hx    Rectal cancer Neg Hx    Stomach cancer Neg Hx     Social History Social History   Tobacco Use    Smoking status: Never   Smokeless tobacco: Never  Vaping Use   Vaping status: Never Used  Substance Use Topics  Alcohol use: Not Currently    Comment: 1 drink/month   Drug use: Never     Allergies   Lisinopril and Sumatriptan   Review of Systems Review of Systems  Skin:  Positive for rash.     Physical Exam Triage Vital Signs ED Triage Vitals  Encounter Vitals Group     BP 03/04/24 1040 112/77     Girls Systolic BP Percentile --      Girls Diastolic BP Percentile --      Boys Systolic BP Percentile --      Boys Diastolic BP Percentile --      Pulse Rate 03/04/24 1040 75     Resp 03/04/24 1040 19     Temp 03/04/24 1040 98 F (36.7 C)     Temp src --      SpO2 03/04/24 1040 98 %     Weight --      Height --      Head Circumference --      Peak Flow --      Pain Score 03/04/24 1039 0     Pain Loc --      Pain Education --      Exclude from Growth Chart --    No data found.  Updated Vital Signs BP 112/77   Pulse 75   Temp 98 F (36.7 C)   Resp 19   SpO2 98%   Physical Exam Vitals and nursing note reviewed.  Constitutional:      Appearance: Normal appearance. She is normal weight.  HENT:     Head: Normocephalic and atraumatic.     Mouth/Throat:     Mouth: Mucous membranes are moist.     Pharynx: Oropharynx is clear.  Eyes:     Extraocular Movements: Extraocular movements intact.     Conjunctiva/sclera: Conjunctivae normal.     Pupils: Pupils are equal, round, and reactive to light.  Cardiovascular:     Rate and Rhythm: Normal rate and regular rhythm.     Pulses: Normal pulses.     Heart sounds: Normal heart sounds.  Pulmonary:     Effort: Pulmonary effort is normal.     Breath sounds: Normal breath sounds. No wheezing, rhonchi or rales.  Musculoskeletal:        General: Normal range of motion.  Skin:    General: Skin is warm and dry.     Comments: Central symphysis pubis area: Erythematous, macerated, plaques, erosions, including  erythematous satellites with delicate peripheral scaling and papulopustules noted-please see image below  Neurological:     General: No focal deficit present.     Mental Status: She is alert and oriented to person, place, and time. Mental status is at baseline.  Psychiatric:        Mood and Affect: Mood normal.        Behavior: Behavior normal.      UC Treatments / Results  Labs (all labs ordered are listed, but only abnormal results are displayed) Labs Reviewed - No data to display  EKG   Radiology No results found.  Procedures Procedures (including critical care time)  Medications Ordered in UC Medications  methylPREDNISolone  sodium succinate (SOLU-MEDROL ) 125 mg/2 mL injection 125 mg (has no administration in time range)    Initial Impression / Assessment and Plan / UC Course  I have reviewed the triage vital signs and the nursing notes.  Pertinent labs & imaging results that were available during my care of the patient were reviewed  by me and considered in my medical decision making (see chart for details).     MDM: 1.  Candidal intertrigo-Rx'd Mycolog to: Apply to affected area twice daily for the next 7 to 10 days; 2.  Rash and nonspecific skin-IM Solu-Medrol  125 mg given once in clinic and prior to discharge. Advised patient to apply Mycolog II twice daily for the next 7 to 10 days.  Encouraged increase daily water intake to 64 ounces per day while using this medication.  Advised between applications may use OTC Zeasorb to enhance/stress dryness of this area daily.  Encouraged patient to wear all cotton underwear until rash is resolved.  Final Clinical Impressions(s) / UC Diagnoses   Final diagnoses:  Rash and nonspecific skin eruption  Candidal intertrigo     Discharge Instructions      Advised patient to apply Mycolog II twice daily for the next 7 to 10 days.  Encouraged increase daily water intake to 64 ounces per day while using this medication.  Advised  between applications may use OTC Zeasorb to enhance/stress dryness of this area daily.  Encouraged patient to wear all cotton underwear until rash is resolved.     ED Prescriptions     Medication Sig Dispense Auth. Provider   nystatin -triamcinolone  (MYCOLOG II) cream Apply to affected area twice daily daily x 7 to 10 days 60 g Teddy Sharper, FNP      PDMP not reviewed this encounter.   Teddy Sharper, FNP 03/04/24 1132

## 2024-03-04 NOTE — Patient Instructions (Addendum)
  Clinician sees no significant psychological factors that would hinder the success of bariatric surgery at time of assessment. Clinician supports patient candidacy for Bariatric Surgery.   Patient reports realistic expectations post surgery, is aware of the pre and post surgical process, client reports that behavioral health diagnosis(es) are stable at time of assessment, client reports positive pre and post surgical support system, and client reports motivation to make positive change.

## 2024-03-04 NOTE — Patient Instructions (Signed)
 Hydrate well with water, sugar free electrolytes (Propel, GZERO, sugar free liquid IV, body armour lyte)
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
## 2024-03-04 NOTE — Discharge Instructions (Addendum)
 Advised patient to apply Mycolog II twice daily for the next 7 to 10 days.  Encouraged increase daily water intake to 64 ounces per day while using this medication.  Advised between applications may use OTC Zeasorb to enhance/stress dryness of this area daily.  Encouraged patient to wear all cotton underwear until rash is resolved.

## 2024-03-06 ENCOUNTER — Other Ambulatory Visit: Payer: Self-pay | Admitting: Medical-Surgical

## 2024-03-13 ENCOUNTER — Other Ambulatory Visit (HOSPITAL_COMMUNITY): Payer: Self-pay

## 2024-03-14 ENCOUNTER — Other Ambulatory Visit (HOSPITAL_COMMUNITY): Payer: Self-pay

## 2024-03-14 ENCOUNTER — Encounter (HOSPITAL_COMMUNITY): Payer: Self-pay | Admitting: Pharmacist

## 2024-03-18 ENCOUNTER — Other Ambulatory Visit: Payer: Self-pay | Admitting: Medical-Surgical

## 2024-03-19 DIAGNOSIS — G4733 Obstructive sleep apnea (adult) (pediatric): Secondary | ICD-10-CM | POA: Diagnosis not present

## 2024-03-20 ENCOUNTER — Other Ambulatory Visit (HOSPITAL_COMMUNITY): Payer: Self-pay

## 2024-03-20 MED ORDER — METHOCARBAMOL 500 MG PO TABS
500.0000 mg | ORAL_TABLET | Freq: Three times a day (TID) | ORAL | 0 refills | Status: DC
  Filled 2024-03-20: qty 90, 30d supply, fill #0

## 2024-03-20 MED ORDER — HYDROXYZINE HCL 10 MG PO TABS
10.0000 mg | ORAL_TABLET | Freq: Three times a day (TID) | ORAL | 0 refills | Status: DC | PRN
  Filled 2024-03-20: qty 180, 30d supply, fill #0

## 2024-03-21 ENCOUNTER — Other Ambulatory Visit (HOSPITAL_COMMUNITY): Payer: Self-pay

## 2024-03-25 DIAGNOSIS — K3189 Other diseases of stomach and duodenum: Secondary | ICD-10-CM | POA: Diagnosis not present

## 2024-03-25 DIAGNOSIS — K635 Polyp of colon: Secondary | ICD-10-CM | POA: Diagnosis not present

## 2024-03-25 DIAGNOSIS — I10 Essential (primary) hypertension: Secondary | ICD-10-CM | POA: Diagnosis not present

## 2024-03-25 DIAGNOSIS — Z1211 Encounter for screening for malignant neoplasm of colon: Secondary | ICD-10-CM | POA: Diagnosis not present

## 2024-03-25 DIAGNOSIS — K2950 Unspecified chronic gastritis without bleeding: Secondary | ICD-10-CM | POA: Diagnosis not present

## 2024-03-25 DIAGNOSIS — K219 Gastro-esophageal reflux disease without esophagitis: Secondary | ICD-10-CM | POA: Diagnosis not present

## 2024-03-25 DIAGNOSIS — D123 Benign neoplasm of transverse colon: Secondary | ICD-10-CM | POA: Diagnosis not present

## 2024-04-01 ENCOUNTER — Encounter: Payer: Self-pay | Admitting: Sports Medicine

## 2024-04-19 DIAGNOSIS — G4733 Obstructive sleep apnea (adult) (pediatric): Secondary | ICD-10-CM | POA: Diagnosis not present

## 2024-04-28 ENCOUNTER — Encounter: Payer: Self-pay | Admitting: Medical-Surgical

## 2024-04-28 ENCOUNTER — Ambulatory Visit: Admitting: Medical-Surgical

## 2024-04-28 ENCOUNTER — Other Ambulatory Visit: Payer: Self-pay

## 2024-04-28 ENCOUNTER — Other Ambulatory Visit: Payer: Self-pay | Admitting: Medical-Surgical

## 2024-04-28 MED ORDER — METHOCARBAMOL 500 MG PO TABS
500.0000 mg | ORAL_TABLET | Freq: Three times a day (TID) | ORAL | 0 refills | Status: AC
  Filled 2024-04-28: qty 270, 90d supply, fill #0

## 2024-04-28 NOTE — Progress Notes (Deleted)
        Established patient visit   History of Present Illness   Discussed the use of AI scribe software for clinical note transcription with the patient, who gave verbal consent to proceed.  History of Present Illness           Physical Exam   Physical Exam Assessment & Plan   Problem List Items Addressed This Visit       Cardiovascular and Mediastinum   Essential hypertension     Other   BMI 40.0-44.9, adult (HCC)   Hyperlipidemia   Prediabetes - Primary   Vitamin D  deficiency   Assessment and Plan             Follow up   No follow-ups on file. __________________________________ Zada FREDRIK Palin, DNP, APRN, FNP-BC Primary Care and Sports Medicine [facility] [place]

## 2024-04-29 ENCOUNTER — Other Ambulatory Visit: Payer: Self-pay

## 2024-05-08 ENCOUNTER — Encounter: Payer: Self-pay | Admitting: Medical-Surgical

## 2024-05-08 ENCOUNTER — Ambulatory Visit (INDEPENDENT_AMBULATORY_CARE_PROVIDER_SITE_OTHER): Admitting: Medical-Surgical

## 2024-05-08 VITALS — BP 110/72 | HR 58 | Resp 20 | Ht 62.5 in | Wt 234.0 lb

## 2024-05-08 DIAGNOSIS — R600 Localized edema: Secondary | ICD-10-CM

## 2024-05-08 DIAGNOSIS — R5383 Other fatigue: Secondary | ICD-10-CM

## 2024-05-08 DIAGNOSIS — R7303 Prediabetes: Secondary | ICD-10-CM | POA: Diagnosis not present

## 2024-05-08 DIAGNOSIS — G4700 Insomnia, unspecified: Secondary | ICD-10-CM

## 2024-05-08 DIAGNOSIS — E785 Hyperlipidemia, unspecified: Secondary | ICD-10-CM

## 2024-05-08 DIAGNOSIS — I10 Essential (primary) hypertension: Secondary | ICD-10-CM | POA: Diagnosis not present

## 2024-05-08 LAB — POCT GLYCOSYLATED HEMOGLOBIN (HGB A1C): Hemoglobin A1C: 5.6 % (ref 4.0–5.6)

## 2024-05-08 MED ORDER — FLUOCINOLONE ACETONIDE BODY 0.01 % EX OIL
1.0000 "application " | TOPICAL_OIL | Freq: Every day | CUTANEOUS | 2 refills | Status: AC
  Filled 2024-05-08: qty 118.28, 30d supply, fill #0
  Filled 2024-09-04: qty 118.28, 30d supply, fill #1

## 2024-05-08 MED ORDER — TRAZODONE HCL 50 MG PO TABS
50.0000 mg | ORAL_TABLET | Freq: Every evening | ORAL | 1 refills | Status: DC | PRN
  Filled 2024-05-08: qty 180, 90d supply, fill #0

## 2024-05-08 MED ORDER — FUROSEMIDE 20 MG PO TABS
20.0000 mg | ORAL_TABLET | Freq: Every day | ORAL | 1 refills | Status: DC | PRN
  Filled 2024-05-08: qty 30, 30d supply, fill #0

## 2024-05-08 NOTE — Progress Notes (Signed)
 Acute Office Visit  Subjective:     Patient ID: Prezley Qadir, female    DOB: [birth year], 53 y.o.   MRN: [id number]  Chief Complaint  Patient presents with   Follow-up   Prediabetes    HPI Patient is in today for follow up for prediabetes, hyperlipidemia, depression, and vitamin d  deficiency Patient reports that she is doing well on her medications. Patient states that she has continued issues with sleep. She has tries increasing her trazodone  to 100 mg, but states that she has lingering side effects with the increased dose. She denies having any chest pain, shortness of breath, dizziness, or headaches. Patient is scheduled for gastric sleeve on 06/09/24. Pre-op scheduled on 05/28/24.  Review of Systems  Constitutional: Negative.   HENT: Negative.    Eyes: Negative.   Respiratory: Negative.    Cardiovascular: Negative.   Gastrointestinal: Negative.   Genitourinary: Negative.   Musculoskeletal: Negative.   Skin: Negative.   Neurological: Negative.   Endo/Heme/Allergies: Negative.   Psychiatric/Behavioral: Negative.          Objective:    BP 110/72 (BP Location: Right Arm, Cuff Size: Large)   Pulse (!) 58   Resp 20   Ht 5' 2.5 (1.588 m)   Wt 106.1 kg   SpO2 100%   BMI 42.12 kg/m  BP Readings from Last 3 Encounters:  05/08/24 110/72  03/04/24 112/77  03/04/24 123/83   Wt Readings from Last 3 Encounters:  05/08/24 106.1 kg  03/04/24 101.6 kg  02/21/24 103.9 kg   SpO2 Readings from Last 3 Encounters:  05/08/24 100%  03/04/24 98%  03/04/24 100%      Physical Exam Vitals and nursing note reviewed.  Constitutional:      General: She is not in acute distress.    Appearance: Normal appearance. She is obese.  Cardiovascular:     Rate and Rhythm: Normal rate and regular rhythm.     Pulses: Normal pulses.     Heart sounds: Normal heart sounds, S1 normal and S2 normal.  Pulmonary:     Effort: Pulmonary effort is normal.     Breath sounds: Normal breath  sounds.  Musculoskeletal:     Right lower leg: 1+ Pitting Edema present.     Left lower leg: 1+ Pitting Edema present.  Skin:    Capillary Refill: Capillary refill takes less than 2 seconds.  Neurological:     General: No focal deficit present.     Mental Status: She is alert and oriented to person, place, and time.  Psychiatric:        Mood and Affect: Mood normal.        Behavior: Behavior normal.        Thought Content: Thought content normal.        Judgment: Judgment normal.     Results for orders placed or performed in visit on 05/08/24  Hemoglobin A1c  Result Value Ref Range   Hemoglobin A1C 5.8         Assessment & Plan:   1. Prediabetes (Primary) -Last A1C was 5.8 on 01/29/24 checked at [facility] -A1C today in office is 5.6%  2. Essential hypertension -Continue current medication regimen Metoprolol  succinate 25 mg and Diovan  320-25 mg daily  3. Hyperlipidemia, unspecified hyperlipidemia type -Continue atorvastatin  10 mg daily  4. Morbid obesity (HCC) -Scheduled for Gastric bypass on 11/10 -Pre OP scheduled for 10/29  5. Other fatigue -Increase trazodone  to 75 mg nightly as PRN   6. Bilateral  lower extremity edema -Patient to resume taking furosemide  PRN for edema  Return if symptoms worsen or fail to improve.  Derrek JINNY Freund, NP Student

## 2024-05-08 NOTE — Progress Notes (Signed)
 Medical screening examination/treatment was performed by qualified clinical staff member and as supervising provider I was immediately available for consultation/collaboration. I have reviewed documentation and agree with assessment and plan.  Thayer Ohm, DNP, APRN, FNP-BC [facility] and Sports Medicine

## 2024-05-09 ENCOUNTER — Other Ambulatory Visit: Payer: Self-pay

## 2024-05-09 ENCOUNTER — Other Ambulatory Visit (HOSPITAL_COMMUNITY): Payer: Self-pay

## 2024-05-12 ENCOUNTER — Encounter: Payer: Self-pay | Admitting: Dietician

## 2024-05-12 ENCOUNTER — Encounter: Attending: Surgery | Admitting: Dietician

## 2024-05-12 VITALS — Ht 62.5 in | Wt 237.9 lb

## 2024-05-12 DIAGNOSIS — E669 Obesity, unspecified: Secondary | ICD-10-CM | POA: Insufficient documentation

## 2024-05-12 NOTE — Progress Notes (Signed)
 Pre-Operative Nutrition Class:    Patient was seen on 05/12/2024 for Pre-Operative Bariatric Surgery Education at the [facility].    Surgery date: 06/09/2024 Surgery type: Sleeve Gastrectomy  Anthropometrics  Start weight at [facility]: 234 lbs (date: 01/30/2024)  Height: 62.5 in Weight: 237.9 pounds BMI: 42.82   Clinical  Medical hx: GERD, OSA, hyperlipidemia, HTN, arthritis, allergies, glaucoma Medications: see list  Labs: WNL Notable signs/symptoms: Food feeling like it gets stuck   Samples given per MNT protocol. Patient educated on appropriate usage: Celebrate Vitamins Multivitamin Lot # [id number] Exp: 03/2025   Celebrate Vitamins Calcium   Lot # [id number] Exp: 12/25   Protein Shake Ensure Max Protein Lot # [id number] Exp: 31 Aug 2024  The following the learning objectives were met by the patient during this course: Identify Pre-Op Dietary Goals and will begin 2 weeks pre-operatively Identify appropriate sources of fluids and proteins  State protein recommendations and appropriate sources pre and post-operatively Identify Post-Operative Dietary Goals and will follow for 2 weeks post-operatively Identify appropriate multivitamin and calcium  sources Describe the need for physical activity post-operatively and will follow MD recommendations State when to call healthcare provider regarding medication questions or post-operative complications When having a diagnosis of diabetes understanding hypoglycemia symptoms and the inclusion of 1 complex carbohydrate per meal  Handouts given during class include: Pre-Op Bariatric Surgery Diet Handout Protein Shake Handout Post-Op Bariatric Surgery Nutrition Handout BELT Program Information Flyer Support Group Information Flyer [facility] Bariatric Supplements Price List  Follow-Up Plan: Patient will follow-up at [facility] 2 weeks post operatively for diet advancement per MD.

## 2024-05-14 ENCOUNTER — Ambulatory Visit: Admitting: Family Medicine

## 2024-05-15 ENCOUNTER — Ambulatory Visit
Admission: EM | Admit: 2024-05-15 | Discharge: 2024-05-15 | Disposition: A | Discharge status: Home / Self Care | Attending: Emergency Medicine | Admitting: Emergency Medicine

## 2024-05-15 DIAGNOSIS — M5432 Sciatica, left side: Secondary | ICD-10-CM | POA: Diagnosis not present

## 2024-05-15 MED ORDER — ACETAMINOPHEN 500 MG PO TABS: 1000.0000 mg | ORAL_TABLET | Freq: Three times a day (TID) | ORAL | 0 refills | Status: AC | PRN

## 2024-05-15 MED ORDER — DICLOFENAC SODIUM 1 % EX GEL: 4.0000 g | Freq: Four times a day (QID) | CUTANEOUS | 2 refills | Status: AC

## 2024-05-15 MED ORDER — TRIAMCINOLONE ACETONIDE 40 MG/ML IJ SUSP
80.0000 mg | Freq: Once | INTRAMUSCULAR | Status: AC
  Administered 2024-05-15: 80 mg via INTRAMUSCULAR

## 2024-05-15 MED ORDER — METHYLPREDNISOLONE 4 MG PO TBPK: ORAL_TABLET | ORAL | 0 refills | Status: DC

## 2024-05-15 NOTE — Discharge Instructions (Signed)
 The mainstay of therapy for musculoskeletal pain is reduction of inflammation and relaxation of tension which is causing inflammation.  Keep in mind, pain always begets more pain.  To help you stay ahead of your pain and inflammation, I have provided the following regimen for you:   During your visit today, you received an injection of a high-dose steroidal anti-inflammatory medication that should significantly reduce your pain for the next 6 to 8 hours.   When you pick up your prescription from the pharmacy, please begin taking Tylenol  975 mg 3 times daily (every 8 hours).  Please know that It is safe to take a maximum 3000 mg of Tylenol  in a 24-hour period.  Please do not exceed this amount.  Tylenol  works best when taken on a scheduled basis.   Please continue taking your muscle relaxer as prescribed. This will provide you with relaxation of your tense muscles, allow you to sleep well and will help to keep your pain under control.   Tomorrow morning, please begin taking methylprednisolone .  Please take 1 full row tablets at once with your breakfast meal.  This will continue to keep your inflammation under control until your body can heal.   During the day, please set aside time to apply ice to the affected area 4 times daily for 20 minutes each application.  This can be achieved by using a bag of frozen peas or corn, a Ziploc bag filled with ice and water, or Ziploc bag filled with half rubbing alcohol and half Dawn dish detergent, frozen into a slush.  Please be careful not to apply ice directly to your skin, always place a soft cloth between you and the ice pack.  Over-the-counter products such as IcyHot and Biofreeze do not work nearly as well.   You are welcome to use topical anti-inflammatory creams such as Voltaren gel, capsaicin or Aspercreme as recommended.  These medications are available over-the-counter, please follow manufactures instructions for use.  As a courtesy, I provided you with a  prescription for diclofenac in the event that your insurance will pay for this.   Please consider discussing referral to physical therapy with your primary care provider.  Physical therapist are very good at teasing out the underlying cause of acute musculoskeletal pain and helping with prevention of future recurrences.   Please avoid attempts to stretch or strengthen the affected area until you are feeling completely pain-free.  Attempts to do so will only prolong the healing process.   If you would like to try to return return to urgent care in the next 2 to 3 days for repeat ketorolac injection, you are welcome to do so.   I also recommend that you remain out of work for the next several days, I provided you with a note to return to work in 3 days.  If you feel that you need this time extended, please follow-up with your primary care provider or return to urgent care for reevaluation so that we can provide you with a note for another 3 days.   Thank you for visiting [facility] today.  We appreciate the opportunity to participate in your care.

## 2024-05-15 NOTE — ED Provider Notes (Signed)
 [name]    CSN: [id number] Arrival date & time: 05/15/24  1633    HISTORY   Chief Complaint  Patient presents with   Leg Pain   HPI Amy Diaz is a pleasant, 53 y.o. female who presents to urgent care today. Patient states she believes she pulled a muscle in her left posterior upper leg that radiates across her buttock to her lower back.  States she believes this happened when she rolled over in bed 3 days ago.  Patient states she has tried taking Aleve, naproxen, Robaxin , Voltaren p.o. and Tylenol  without meaningful relief, states Aleve has helped the most.  Patient reports a history of degenerative lumbar disc disease.  Patient states she has never had a pain in her leg like this before.  Denies loss of bowel or bladder function, recent fall.  The history is provided by the patient.  Leg Pain  Past Medical History:  Diagnosis Date   Abnormal mammogram    Allergy    on zyrtec - seasonal   Arthritis    back   Back pain    Back pain    Dysrhythmia    Edema    Enthesopathy    GERD (gastroesophageal reflux disease)    Glaucoma    no drops so far early onset per pt   H/O degenerative disc disease    Heartburn    High cholesterol    Hypertension    Internal derangement of right knee 06/06/2018   Joint pain    Prediabetes    Sleep apnea    no OSA per pt per 2 sleep studies   Swelling    Patient Active Problem List   Diagnosis Date Noted   Cutaneous abscess of abdominal wall 08/24/2023   OSA on CPAP 07/02/2023   Insulin  resistance 05/15/2023   Vitamin D  deficiency 05/15/2023   Other fatigue 04/30/2023   SOBOE (shortness of breath on exertion) 04/30/2023   Hyperlipidemia 04/30/2023   DDD (degenerative disc disease), lumbar 04/30/2023   Polyphagia 04/30/2023   Depression screen 04/30/2023   BMI 40.0-44.9, adult (HCC) 04/30/2023   Primary osteoarthritis of both knees 02/20/2022   Prediabetes 11/03/2019   Morbid obesity (HCC) 11/03/2019   Insomnia  11/03/2019   Essential hypertension 11/03/2019   Cyst, dermoid, trunk 07/19/2018   Breast mass, right 10/15/2016   Migraine without aura or status migrainosus 12/18/2015   Leiomyoma 01/12/2012   Bilateral edema of lower extremity 09/19/2011   Past Surgical History:  Procedure Laterality Date   CARPAL TUNNEL RELEASE Right    CESAREAN SECTION     x1   CYST REMOVAL TRUNK     ECTOPIC PREGNANCY SURGERY     MASS EXCISION Right 09/25/2023   Procedure: EXCISION RIGHT CHEST SEBACEOUS CYST;  Surgeon: Amy Deward PARAS, MD;  Location: [facility];  Service: General;  Laterality: Right;  90   TOTAL ABDOMINAL HYSTERECTOMY     WISDOM TOOTH EXTRACTION     OB History     Gravida  6   Para  3   Term  3   Preterm      AB  3   Living         SAB  2   IAB      Ectopic  1   Multiple      Live Births             Home Medications    Prior to Admission medications   Medication Sig Start Date  End Date Taking? Authorizing Provider  AMBULATORY NON FORMULARY MEDICATION Continuous positive airway pressure (CPAP) machine set on AutoPAP (4-20 cmH2O), with all supplemental supplies as needed. 06/20/23  Yes Amy Mini, NP  aspirin  EC 81 MG tablet Take 1 tablet (81 mg total) by mouth daily. Swallow whole. 07/03/23  Yes Amy Mini, NP  atorvastatin  (LIPITOR) 10 MG tablet Take 1 tablet (10 mg total) by mouth daily. 08/17/23  Yes Amy Mini, NP  Brimonidine Tartrate (LUMIFY) 0.025 % SOLN Place 1 drop into both eyes in the morning.   Yes [provider]  busPIRone  (BUSPAR ) 5 MG tablet TAKE 1-3 TABLETS (5-15 MG TOTAL) BY MOUTH 2 (TWO) TIMES DAILY AS NEEDED. 01/02/24  Yes Amy Mini, NP  celecoxib  (CELEBREX ) 100 MG capsule Take 1 capsule (100 mg total) by mouth daily. 12/13/23  Yes Amy Mini, NP  cetirizine  (ZYRTEC ) 10 MG tablet Take 1 tablet (10 mg total) by mouth daily. 02/25/24  Yes Amy Mini, NP  famotidine  (PEPCID ) 40 MG tablet Take 1 tablet (40 mg total) by mouth at  bedtime. Patient taking differently: Take 40 mg by mouth daily as needed. 02/07/22  Yes Amy Mini, NP  Fluocinolone  Acetonide Body 0.01 % OIL Apply 1 application  topically daily. 05/08/24  Yes Amy Mini, NP  furosemide  (LASIX ) 20 MG tablet Take 1 tablet (20 mg total) by mouth daily as needed. 05/08/24  Yes Amy Mini, NP  hydrOXYzine  (ATARAX ) 10 MG tablet Take 1-2 tablets (10-20 mg total) by mouth 3 (three) times daily as needed. 03/20/24  Yes Amy Mini, NP  methocarbamol  (ROBAXIN ) 500 MG tablet Take 1 tablet (500 mg total) by mouth 3 (three) times daily. 04/28/24  Yes Amy Mini, NP  metoprolol  succinate (TOPROL -XL) 25 MG 24 hr tablet Take 1 tablet (25 mg total) by mouth daily. 02/25/24  Yes Amy Mini, NP  Multiple Vitamin (MULTIVITAMIN) tablet Take 1 tablet by mouth in the morning.   Yes [provider]  omeprazole  (PRILOSEC) 40 MG capsule Take 1 capsule (40 mg total) by mouth daily before supper. 02/25/24  Yes Amy Mini, NP  Propylene Glycol (SYSTANE COMPLETE) 0.6 % SOLN Place 1 drop into both eyes at bedtime. Patient taking differently: Place 1 drop into both eyes daily as needed.   Yes [provider]  traZODone  (DESYREL ) 50 MG tablet Take 1-2 tablets (50-100 mg total) by mouth at bedtime as needed for sleep. 05/08/24  Yes Amy Mini, NP  valsartan -hydrochlorothiazide  (DIOVAN -HCT) 320-25 MG tablet Take 0.5 tablets by mouth daily. 12/31/23  Yes Bowen, Amy BRAVO, DO  VITAMIN D  PO Take 4,000 Units by mouth in the morning.   Yes [provider]  Albuterol-Budesonide (AIRSUPRA ) 90-80 MCG/ACT AERO Inhale 2 puffs into the lungs every 6 (six) hours as needed. Patient not taking: Reported on 05/15/2024 10/25/23   Amy Mini, NP  nystatin -triamcinolone  (MYCOLOG II) cream Apply to affected area twice daily daily x 7 to 10 days Patient not taking: Reported on 05/15/2024 03/04/24   Amy Diaz  promethazine -dextromethorphan (PROMETHAZINE -DM) 6.25-15 MG/5ML syrup Take 5  mLs by mouth 4 (four) times daily as needed for cough. Patient not taking: Reported on 05/15/2024 12/19/23   Amy Chiquita HERO, NP    Family History Family History  Problem Relation Age of Onset   Hyperlipidemia Mother    Thyroid  disease Mother    Hypertension Mother    Hyperlipidemia Father    Cancer Father    Hypertension Father    Prostate cancer Father  Anxiety disorder Father    Colon polyps Maternal Aunt    Colon polyps Maternal Aunt    Colon polyps Cousin    Colon polyps Cousin    Colon cancer Neg Hx    Esophageal cancer Neg Hx    Rectal cancer Neg Hx    Stomach cancer Neg Hx    Social History Social History   Tobacco Use   Smoking status: Never    Passive exposure: Never   Smokeless tobacco: Never  Vaping Use   Vaping status: Never Used  Substance Use Topics   Alcohol use: Not Currently    Comment: 1 drink/month   Drug use: Never   Allergies   Lisinopril and Sumatriptan  Review of Systems Review of Systems Pertinent findings revealed after performing a 14 point review of systems has been noted in the history of present illness.  Physical Exam Vital Signs BP (!) 152/88 (BP Location: Left Arm) Comment (BP Location): Forearm  Pulse 81   Temp 98.5 F (36.9 C) (Oral)   Resp 16   SpO2 93%   No data found.  Physical Exam Vitals and nursing note reviewed.  Constitutional:      General: She is awake. She is not in acute distress.    Appearance: Normal appearance. She is well-developed and well-groomed. She is not ill-appearing.  Musculoskeletal:     Lumbar back: Spasms and tenderness present. No bony tenderness. Decreased range of motion. Positive left straight leg raise test.  Neurological:     Mental Status: She is alert.  Psychiatric:        Behavior: Behavior is cooperative.     UC Couse / Diagnostics / Procedures:     Radiology No results found.  Procedures Procedures (including critical care time) EKG  Pending results:  Labs  Reviewed - No data to display  Medications Ordered in UC: Medications  triamcinolone  acetonide (KENALOG -40) injection 80 mg (80 mg Intramuscular Given 05/15/24 1741)    UC Diagnoses / Final Clinical Impressions(s)   I have reviewed the triage vital signs and the nursing notes.  Pertinent labs & imaging results that were available during my care of the patient were reviewed by me and considered in my medical decision making (see chart for details).    Final diagnoses:  Sciatic nerve pain, left   Patient was provided with an injection of steroid during their visit today for acute pain relief. Patient was advised to: Begin Medrol  dose pack Take muscle relaxer 3 times daily (Patient has been advised that if this makes them sleepy, they can just take this at bedtime, up to 20 mg per dose, and try breaking the tablets in half or 5 mg per dose during the day)   Begin acetaminophen  1000 mg 3 times daily on a scheduled basis.   Apply ice pack to affected area 4 times daily for 20 minutes each time   Consider physical therapy, chiropractic care, orthopedic follow-up   Avoid stretching or strengthening exercises until pain is completely resolved Return precautions advised  Please see discharge instructions below for details of plan of care as provided to patient. ED Prescriptions     Medication Sig Dispense Auth. Provider   methylPREDNISolone  (MEDROL  DOSEPAK) 4 MG TBPK tablet Take 24 mg on day 1, 20 mg on day 2, 16 mg on day 3, 12 mg on day 4, 8 mg on day 5, 4 mg on day 6.  Take all tablets in each row at once, do not spread tablets  out throughout the day. 21 tablet Joesph Shaver Scales, PA-C   acetaminophen  (TYLENOL ) 500 MG tablet Take 2 tablets (1,000 mg total) by mouth every 8 (eight) hours as needed for up to 15 days for mild pain (pain score 1-3) or fever. 90 tablet Joesph Shaver Scales, PA-C   diclofenac Sodium (VOLTAREN) 1 % GEL Apply 4 g topically 4 (four) times daily. Apply to  affected areas 4 times daily as needed for pain. 100 g Joesph Shaver Scales, PA-C      PDMP not reviewed this encounter.    Discharge Instructions      The mainstay of therapy for musculoskeletal pain is reduction of inflammation and relaxation of tension which is causing inflammation.  Keep in mind, pain always begets more pain.  To help you stay ahead of your pain and inflammation, I have provided the following regimen for you:   During your visit today, you received an injection of a high-dose steroidal anti-inflammatory medication that should significantly reduce your pain for the next 6 to 8 hours.   When you pick up your prescription from the pharmacy, please begin taking Tylenol  975 mg 3 times daily (every 8 hours).  Please know that It is safe to take a maximum 3000 mg of Tylenol  in a 24-hour period.  Please do not exceed this amount.  Tylenol  works best when taken on a scheduled basis.   Please continue taking your muscle relaxer as prescribed. This Amy provide you with relaxation of your tense muscles, allow you to sleep well and Amy help to keep your pain under control.   Tomorrow morning, please begin taking methylprednisolone .  Please take 1 full row tablets at once with your breakfast meal.  This Amy continue to keep your inflammation under control until your body can heal.   During the day, please set aside time to apply ice to the affected area 4 times daily for 20 minutes each application.  This can be achieved by using a bag of frozen peas or corn, a Ziploc bag filled with ice and water, or Ziploc bag filled with half rubbing alcohol and half Dawn dish detergent, frozen into a slush.  Please be careful not to apply ice directly to your skin, always place a soft cloth between you and the ice pack.  Over-the-counter products such as IcyHot and Biofreeze do not work nearly as well.   You are welcome to use topical anti-inflammatory creams such as Voltaren gel, capsaicin or  Aspercreme as recommended.  These medications are available over-the-counter, please follow manufactures instructions for use.  As a courtesy, I provided you with a prescription for diclofenac in the event that your insurance Amy pay for this.   Please consider discussing referral to physical therapy with your primary care provider.  Physical therapist are very good at teasing out the underlying cause of acute musculoskeletal pain and helping with prevention of future recurrences.   Please avoid attempts to stretch or strengthen the affected area until you are feeling completely pain-free.  Attempts to do so Amy only prolong the healing process.   If you would like to try to return return to urgent care in the next 2 to 3 days for repeat ketorolac injection, you are welcome to do so.   I also recommend that you remain out of work for the next several days, I provided you with a note to return to work in 3 days.  If you feel that you need this time extended, please  follow-up with your primary care provider or return to urgent care for reevaluation so that we can provide you with a note for another 3 days.   Thank you for visiting [facility] today.  We appreciate the opportunity to participate in your care.       Disposition Upon Discharge:  Condition: stable for discharge home Home: take medications as prescribed; routine discharge instructions as discussed; follow up as advised.  Patient presented with an acute illness with associated systemic symptoms and significant discomfort requiring urgent management. In my opinion, this is a condition that a prudent lay person (someone who possesses an average knowledge of health and medicine) may potentially expect to result in complications if not addressed urgently such as respiratory distress, impairment of bodily function or dysfunction of bodily organs.   Routine symptom specific, illness specific and/or disease specific  instructions were discussed with the patient and/or caregiver at length.   As such, the patient has been evaluated and assessed, work-up was performed and treatment was provided in alignment with urgent care protocols and evidence based medicine.  Patient/parent/caregiver has been advised that the patient may require follow up for further testing and treatment if the symptoms continue in spite of treatment, as clinically indicated and appropriate.  Patient/parent/caregiver has been advised to report to orthopedic urgent care clinic or return to the [facility] or PCP in 3-5 days if no better; follow-up with orthopedics, PCP or the Emergency Department if new signs and symptoms develop or if the current signs or symptoms continue to change or worsen for further workup, evaluation and treatment as clinically indicated and appropriate  The patient Amy follow up with their current PCP if and as advised. If the patient does not currently have a PCP we Amy have assisted them in obtaining one.   The patient may need specialty follow up if the symptoms continue, in spite of conservative treatment and management, for further workup, evaluation, consultation and treatment as clinically indicated and appropriate.  Patient/parent/caregiver verbalized understanding and agreement of plan as discussed.  All questions were addressed during visit.  Please see discharge instructions below for further details of plan.  This office note has been dictated using Teaching laboratory technician.  Unfortunately, this method of dictation can sometimes lead to typographical or grammatical errors.  I apologize for your inconvenience in advance if this occurs.  Please do not hesitate to reach out to me if clarification is needed.      Joesph Shaver Scales, [place] 05/15/24 [phone]

## 2024-05-15 NOTE — ED Triage Notes (Signed)
 Pt states she thinks she pulled a muscle in her left posterior upper leg. This occurred when she rolled over in bed three days ago.   She has tried Aleve, Robaxin , Naproxen, Voltaren, and massage. The aleve helped the most and her last dose of this was last night.

## 2024-05-21 NOTE — Progress Notes (Signed)
 Sent message, via epic in basket, requesting orders in epic from Careers adviser.

## 2024-05-22 ENCOUNTER — Ambulatory Visit: Payer: Self-pay | Admitting: Surgery

## 2024-05-22 DIAGNOSIS — Z01818 Encounter for other preprocedural examination: Secondary | ICD-10-CM

## 2024-05-26 NOTE — Progress Notes (Signed)
 Anesthesia Review:  PCP: joy Jessup LOv 05/08/24  Cardiologist :  PPM/ ICD: Device Orders: Rep Notified:  Chest x-ray : 02/08/24- 2 view  EKG : 02/06/24  Echo : Stress test: Cardiac Cath :   Activity level:  Sleep Study/ CPAP : Fasting Blood Sugar :      / Checks Blood Sugar -- times a day:    Blood Thinner/ Instructions /Last Dose: ASA / Instructions/ Last Dose :    05/08/24-hgba1c- 5.6

## 2024-05-27 NOTE — Patient Instructions (Addendum)
 SURGICAL WAITING ROOM VISITATION  Patients having surgery or a procedure may have no more than 2 support people in the waiting area - these visitors may rotate.    Children under the age of 69 must have an adult with them who is not the patient.  Visitors with respiratory illnesses are discouraged from visiting and should remain at home.  If the patient needs to stay at the hospital during part of their recovery, the visitor guidelines for inpatient rooms apply. Pre-op nurse will coordinate an appropriate time for 1 support person to accompany patient in pre-op.  This support person may not rotate.    Please refer to the [facility] website for the visitor guidelines for Inpatients (after your surgery is over and you are in a regular room).       Your procedure is scheduled on:  06/09/2024    Report to [facility] Main Entrance    Report to admitting at  0515 AM   Call this number if you have problems the morning of surgery [phone]   Do not eat food :After Midnight.   After Midnight you may have the following liquids until _ 0430_____ AM DAY OF SURGERY  Water Non-Citrus Juices (without pulp, NO RED-Apple, White grape, White cranberry) Black Coffee (NO MILK/CREAM OR CREAMERS, sugar ok)  Clear Tea (NO MILK/CREAM OR CREAMERS, sugar ok) regular and decaf                             Plain Jell-O (NO RED)                                           Fruit ices (not with fruit pulp, NO RED)                                     Popsicles (NO RED)                                                               Sports drinks like Gatorade (NO RED)                   The day of surgery:  Drink ONE (1) Pre-Surgery Clear Ensure or G2 at  0430AM the morning of surgery. Drink in one sitting. Do not sip.  This drink was given to you during your hospital    pre-op appointment visit. Nothing else to drink after completing the  Pre-Surgery Clear Ensure or G2.          If you have  questions, please contact your surgeon's office.      Oral Hygiene is also important to reduce your risk of infection.                                    Remember - BRUSH YOUR TEETH THE MORNING OF SURGERY WITH YOUR REGULAR TOOTHPASTE  DENTURES WILL BE REMOVED PRIOR TO SURGERY PLEASE DO NOT APPLY Poly grip OR ADHESIVES!!!  Do NOT smoke after Midnight   Stop all vitamins and herbal supplements 7 days before surgery.   Take these medicines the morning of surgery with A SIP OF WATER:   buspar , zyrtec , pepcid  if needed, toprol    DO NOT TAKE ANY ORAL DIABETIC MEDICATIONS DAY OF YOUR SURGERY  Bring CPAP mask and tubing day of surgery.                              You may not have any metal on your body including hair pins, jewelry, and body piercing             Do not wear make-up, lotions, powders, perfumes/cologne, or deodorant  Do not wear nail polish including gel and S&S, artificial/acrylic nails, or any other type of covering on natural nails including finger and toenails. If you have artificial nails, gel coating, etc. that needs to be removed by a nail salon please have this removed prior to surgery or surgery may need to be canceled/ delayed if the surgeon/ anesthesia feels like they are unable to be safely monitored.   Do not shave  48 hours prior to surgery.               Men may shave face and neck.   Do not bring valuables to the hospital. [facility] IS NOT             RESPONSIBLE   FOR VALUABLES.   Contacts, glasses, dentures or bridgework may not be worn into surgery.   Bring small overnight bag day of surgery.   DO NOT BRING YOUR HOME MEDICATIONS TO THE HOSPITAL. PHARMACY WILL DISPENSE MEDICATIONS LISTED ON YOUR MEDICATION LIST TO YOU DURING YOUR ADMISSION IN THE HOSPITAL!    Patients discharged on the day of surgery will not be allowed to drive home.  Someone NEEDS to stay with you for the first 24 hours after anesthesia.   Special Instructions: Bring a copy of  your healthcare power of attorney and living will documents the day of surgery if you haven't scanned them before.              Please read over the following fact sheets you were given: IF YOU HAVE QUESTIONS ABOUT YOUR PRE-OP INSTRUCTIONS PLEASE CALL [phone].   If you received a COVID test during your pre-op visit  it is requested that you wear a mask when out in public, stay away from anyone that may not be feeling well and notify your surgeon if you develop symptoms. If you test positive for Covid or have been in contact with anyone that has tested positive in the last 10 days please notify you surgeon.    [facility] - Preparing for Surgery Before surgery, you can play an important role.  Because skin is not sterile, your skin needs to be as free of germs as possible.  You can reduce the number of germs on your skin by washing with CHG (chlorahexidine gluconate) soap before surgery.  CHG is an antiseptic cleaner which kills germs and bonds with the skin to continue killing germs even after washing. Please DO NOT use if you have an allergy to CHG or antibacterial soaps.  If your skin becomes reddened/irritated stop using the CHG and inform your nurse when you arrive at Short Stay. Do not shave (including legs and underarms) for at least 48 hours prior to the first CHG shower.  You may  shave your face/neck.  Please follow these instructions carefully:  1.  Shower with CHG Soap the night before surgery ONLY (DO NOT USE THE SOAP THE MORNING OF SURGERY).  2.  If you choose to wash your hair, wash your hair first as usual with your normal  shampoo.  3.  After you shampoo, rinse your hair and body thoroughly to remove the shampoo.                             4.  Use CHG as you would any other liquid soap.  You can apply chg directly to the skin and wash.  Gently with a scrungie or clean washcloth.  5.  Apply the CHG Soap to your body ONLY FROM THE NECK DOWN.   Do   not use on face/ open                            Wound or open sores. Avoid contact with eyes, ears mouth and   genitals (private parts).                       Wash face,  Genitals (private parts) with your normal soap.             6.  Wash thoroughly, paying special attention to the area where your    surgery  will be performed.  7.  Thoroughly rinse your body with warm water from the neck down.  8.  DO NOT shower/wash with your normal soap after using and rinsing off the CHG Soap.                9.  Pat yourself dry with a clean towel.            10.  Wear clean pajamas.            11.  Place clean sheets on your bed the night of your first shower and do not  sleep with pets. Day of Surgery : Do not apply any CHG, lotions/deodorants the morning of surgery.  Please wear clean clothes to the hospital/surgery center.  FAILURE TO FOLLOW THESE INSTRUCTIONS MAY RESULT IN THE CANCELLATION OF YOUR SURGERY  PATIENT SIGNATURE_________________________________  NURSE SIGNATURE__________________________________  ________________________________________________________________________

## 2024-05-28 ENCOUNTER — Encounter (HOSPITAL_COMMUNITY): Payer: Self-pay

## 2024-05-28 ENCOUNTER — Other Ambulatory Visit: Payer: Self-pay

## 2024-05-28 ENCOUNTER — Encounter (HOSPITAL_COMMUNITY)
Admission: RE | Admit: 2024-05-28 | Discharge: 2024-05-28 | Disposition: A | Discharge status: Home / Self Care | Source: Ambulatory Visit | Attending: Surgery | Admitting: Surgery

## 2024-05-28 DIAGNOSIS — Z6841 Body Mass Index (BMI) 40.0 and over, adult: Secondary | ICD-10-CM | POA: Diagnosis not present

## 2024-05-28 DIAGNOSIS — Z01818 Encounter for other preprocedural examination: Secondary | ICD-10-CM | POA: Diagnosis present

## 2024-05-28 DIAGNOSIS — Z01812 Encounter for preprocedural laboratory examination: Secondary | ICD-10-CM | POA: Insufficient documentation

## 2024-05-28 HISTORY — DX: Other complications of anesthesia, initial encounter: T88.59XA

## 2024-05-28 LAB — COMPREHENSIVE METABOLIC PANEL WITH GFR
ALT: 14 U/L (ref 0–44)
AST: 16 U/L (ref 15–41)
Albumin: 4.3 g/dL (ref 3.5–5.0)
Alkaline Phosphatase: 67 U/L (ref 38–126)
Anion gap: 9 (ref 5–15)
BUN: 27 mg/dL — ABNORMAL HIGH (ref 6–20)
CO2: 28 mmol/L (ref 22–32)
Calcium: 10.4 mg/dL — ABNORMAL HIGH (ref 8.9–10.3)
Chloride: 103 mmol/L (ref 98–111)
Creatinine, Ser: 1.04 mg/dL — ABNORMAL HIGH (ref 0.44–1.00)
GFR, Estimated: >60 mL/min (ref >60)
Glucose, Bld: 89 mg/dL (ref 70–99)
Potassium: 4.1 mmol/L (ref 3.5–5.1)
Sodium: 140 mmol/L (ref 135–145)
Total Bilirubin: 0.3 mg/dL (ref 0.0–1.2)
Total Protein: 7.5 g/dL (ref 6.5–8.1)

## 2024-05-28 LAB — CBC WITH DIFFERENTIAL/PLATELET
Abs Immature Granulocytes: 0.02 K/uL (ref 0.00–0.07)
Basophils Absolute: 0.1 K/uL (ref 0.0–0.1)
Basophils Relative: 1 %
Eosinophils Absolute: 0.2 K/uL (ref 0.0–0.5)
Eosinophils Relative: 4 %
HCT: 41.2 % (ref 36.0–46.0)
Hemoglobin: 12.7 g/dL (ref 12.0–15.0)
Immature Granulocytes: 0 %
Lymphocytes Relative: 51 %
Lymphs Abs: 3.3 K/uL (ref 0.7–4.0)
MCH: 28.5 pg (ref 26.0–34.0)
MCHC: 30.8 g/dL (ref 30.0–36.0)
MCV: 92.4 fL (ref 80.0–100.0)
Monocytes Absolute: 0.5 K/uL (ref 0.1–1.0)
Monocytes Relative: 8 %
Neutro Abs: 2.3 K/uL (ref 1.7–7.7)
Neutrophils Relative %: 36 %
Platelets: 405 K/uL — ABNORMAL HIGH (ref 150–400)
RBC: 4.46 MIL/uL (ref 3.87–5.11)
RDW: 14 % (ref 11.5–15.5)
WBC: 6.3 K/uL (ref 4.0–10.5)
nRBC: 0 % (ref 0.0–0.2)

## 2024-05-28 LAB — TYPE AND SCREEN
ABO/RH(D): A POS
Antibody Screen: NEGATIVE

## 2024-06-09 ENCOUNTER — Other Ambulatory Visit (HOSPITAL_COMMUNITY): Payer: Self-pay

## 2024-06-09 ENCOUNTER — Inpatient Hospital Stay (HOSPITAL_COMMUNITY)
Admission: RE | Admit: 2024-06-09 | Discharge: 2024-06-12 | DRG: 621 | Disposition: A | Discharge status: Home / Self Care | Source: Ambulatory Visit | Attending: Surgery | Admitting: Surgery

## 2024-06-09 ENCOUNTER — Encounter (HOSPITAL_COMMUNITY)
Admission: RE | Disposition: A | Discharge status: Home / Self Care | Payer: Self-pay | Source: Ambulatory Visit | Attending: Surgery

## 2024-06-09 ENCOUNTER — Encounter (HOSPITAL_COMMUNITY): Payer: Self-pay | Admitting: Surgery

## 2024-06-09 ENCOUNTER — Inpatient Hospital Stay (HOSPITAL_COMMUNITY): Payer: Self-pay | Admitting: Physician Assistant

## 2024-06-09 ENCOUNTER — Inpatient Hospital Stay (HOSPITAL_COMMUNITY)

## 2024-06-09 ENCOUNTER — Other Ambulatory Visit: Payer: Self-pay

## 2024-06-09 DIAGNOSIS — Z8042 Family history of malignant neoplasm of prostate: Secondary | ICD-10-CM | POA: Diagnosis not present

## 2024-06-09 DIAGNOSIS — Z6841 Body Mass Index (BMI) 40.0 and over, adult: Secondary | ICD-10-CM

## 2024-06-09 DIAGNOSIS — E7800 Pure hypercholesterolemia, unspecified: Secondary | ICD-10-CM | POA: Diagnosis present

## 2024-06-09 DIAGNOSIS — Z83719 Family history of colon polyps, unspecified: Secondary | ICD-10-CM

## 2024-06-09 DIAGNOSIS — Z818 Family history of other mental and behavioral disorders: Secondary | ICD-10-CM

## 2024-06-09 DIAGNOSIS — Z7982 Long term (current) use of aspirin: Secondary | ICD-10-CM

## 2024-06-09 DIAGNOSIS — Z90710 Acquired absence of both cervix and uterus: Secondary | ICD-10-CM | POA: Diagnosis not present

## 2024-06-09 DIAGNOSIS — Z8349 Family history of other endocrine, nutritional and metabolic diseases: Secondary | ICD-10-CM | POA: Diagnosis not present

## 2024-06-09 DIAGNOSIS — M170 Bilateral primary osteoarthritis of knee: Secondary | ICD-10-CM | POA: Diagnosis present

## 2024-06-09 DIAGNOSIS — Z79899 Other long term (current) drug therapy: Secondary | ICD-10-CM

## 2024-06-09 DIAGNOSIS — Z888 Allergy status to other drugs, medicaments and biological substances status: Secondary | ICD-10-CM | POA: Diagnosis not present

## 2024-06-09 DIAGNOSIS — J90 Pleural effusion, not elsewhere classified: Secondary | ICD-10-CM | POA: Diagnosis not present

## 2024-06-09 DIAGNOSIS — K219 Gastro-esophageal reflux disease without esophagitis: Secondary | ICD-10-CM | POA: Diagnosis present

## 2024-06-09 DIAGNOSIS — I10 Essential (primary) hypertension: Secondary | ICD-10-CM | POA: Diagnosis present

## 2024-06-09 DIAGNOSIS — J9811 Atelectasis: Secondary | ICD-10-CM | POA: Diagnosis not present

## 2024-06-09 DIAGNOSIS — Z01818 Encounter for other preprocedural examination: Secondary | ICD-10-CM

## 2024-06-09 DIAGNOSIS — Z8249 Family history of ischemic heart disease and other diseases of the circulatory system: Secondary | ICD-10-CM

## 2024-06-09 DIAGNOSIS — R7303 Prediabetes: Secondary | ICD-10-CM | POA: Diagnosis present

## 2024-06-09 DIAGNOSIS — G4733 Obstructive sleep apnea (adult) (pediatric): Secondary | ICD-10-CM

## 2024-06-09 DIAGNOSIS — J189 Pneumonia, unspecified organism: Secondary | ICD-10-CM | POA: Diagnosis not present

## 2024-06-09 DIAGNOSIS — K5730 Diverticulosis of large intestine without perforation or abscess without bleeding: Secondary | ICD-10-CM | POA: Diagnosis not present

## 2024-06-09 LAB — CREATININE, SERUM
Creatinine, Ser: 1.2 mg/dL — ABNORMAL HIGH (ref 0.44–1.00)
GFR, Estimated: 54 mL/min — ABNORMAL LOW (ref >60)

## 2024-06-09 LAB — CBC
HCT: 41 % (ref 36.0–46.0)
Hemoglobin: 13.6 g/dL (ref 12.0–15.0)
MCH: 30.2 pg (ref 26.0–34.0)
MCHC: 33.2 g/dL (ref 30.0–36.0)
MCV: 90.9 fL (ref 80.0–100.0)
Platelets: UNDETERMINED K/uL (ref 150–400)
RBC: 4.51 MIL/uL (ref 3.87–5.11)
RDW: 13.9 % (ref 11.5–15.5)
WBC: 9.7 K/uL (ref 4.0–10.5)
nRBC: 0 % (ref 0.0–0.2)

## 2024-06-09 LAB — ABO/RH: ABO/RH(D): A POS

## 2024-06-09 SURGERY — GASTRECTOMY, SLEEVE, ROBOT-ASSISTED
Anesthesia: General

## 2024-06-09 MED ORDER — SUGAMMADEX SODIUM 200 MG/2ML IV SOLN
INTRAVENOUS | Status: AC
  Filled 2024-06-09: qty 2

## 2024-06-09 MED ORDER — ACETAMINOPHEN 500 MG PO TABS
1000.0000 mg | ORAL_TABLET | ORAL | Status: AC
  Administered 2024-06-09: 1000 mg via ORAL
  Filled 2024-06-09: qty 2

## 2024-06-09 MED ORDER — ENOXAPARIN SODIUM 40 MG/0.4ML IJ SOSY
40.0000 mg | PREFILLED_SYRINGE | Freq: Two times a day (BID) | INTRAMUSCULAR | 0 refills | Status: DC
  Filled 2024-06-09: qty 11.2, 14d supply, fill #0

## 2024-06-09 MED ORDER — LACTATED RINGERS IV SOLN: INTRAVENOUS | Status: AC

## 2024-06-09 MED ORDER — FENTANYL CITRATE (PF) 50 MCG/ML IJ SOSY
PREFILLED_SYRINGE | INTRAMUSCULAR | Status: AC
  Filled 2024-06-09: qty 2

## 2024-06-09 MED ORDER — VALSARTAN-HYDROCHLOROTHIAZIDE 320-25 MG PO TABS: 1.0000 | ORAL_TABLET | Freq: Every day | ORAL | Status: DC

## 2024-06-09 MED ORDER — ACETAMINOPHEN 500 MG PO TABS
1000.0000 mg | ORAL_TABLET | Freq: Three times a day (TID) | ORAL | Status: DC
  Administered 2024-06-09 – 2024-06-11 (×4): 1000 mg via ORAL
  Filled 2024-06-09 (×5): qty 2

## 2024-06-09 MED ORDER — PHENYLEPHRINE HCL-NACL 20-0.9 MG/250ML-% IV SOLN
INTRAVENOUS | Status: AC
  Filled 2024-06-09: qty 250

## 2024-06-09 MED ORDER — SIMETHICONE 80 MG PO CHEW
80.0000 mg | CHEWABLE_TABLET | Freq: Four times a day (QID) | ORAL | Status: DC | PRN
  Administered 2024-06-09 – 2024-06-12 (×8): 80 mg via ORAL
  Filled 2024-06-09 (×8): qty 1

## 2024-06-09 MED ORDER — CHLORHEXIDINE GLUCONATE CLOTH 2 % EX PADS: 6.0000 | MEDICATED_PAD | Freq: Once | CUTANEOUS | Status: DC

## 2024-06-09 MED ORDER — BUPIVACAINE-EPINEPHRINE 0.25% -1:200000 IJ SOLN
INTRAMUSCULAR | Status: DC | PRN
  Administered 2024-06-09: 60 mL

## 2024-06-09 MED ORDER — AMISULPRIDE (ANTIEMETIC) 5 MG/2ML IV SOLN: 10.0000 mg | Freq: Once | INTRAVENOUS | Status: DC | PRN

## 2024-06-09 MED ORDER — OXYCODONE HCL 5 MG/5ML PO SOLN: 5.0000 mg | Freq: Once | ORAL | Status: DC | PRN

## 2024-06-09 MED ORDER — PROPOFOL 10 MG/ML IV BOLUS
INTRAVENOUS | Status: AC
  Filled 2024-06-09: qty 20

## 2024-06-09 MED ORDER — OXYCODONE HCL 5 MG PO TABS: 5.0000 mg | ORAL_TABLET | Freq: Once | ORAL | Status: DC | PRN

## 2024-06-09 MED ORDER — ROCURONIUM BROMIDE 100 MG/10ML IV SOLN
INTRAVENOUS | Status: DC | PRN
  Administered 2024-06-09: 20 mg via INTRAVENOUS
  Administered 2024-06-09: 60 mg via INTRAVENOUS

## 2024-06-09 MED ORDER — ONDANSETRON HCL 4 MG/2ML IJ SOLN
4.0000 mg | INTRAMUSCULAR | Status: DC | PRN
Start: 2024-06-09 — End: 2024-06-12
  Administered 2024-06-09 – 2024-06-10 (×2): 4 mg via INTRAVENOUS
  Filled 2024-06-09 (×2): qty 2

## 2024-06-09 MED ORDER — APREPITANT 40 MG PO CAPS
40.0000 mg | ORAL_CAPSULE | ORAL | Status: AC
  Administered 2024-06-09: 40 mg via ORAL
  Filled 2024-06-09: qty 1

## 2024-06-09 MED ORDER — PHENYLEPHRINE 80 MCG/ML (10ML) SYRINGE FOR IV PUSH (FOR BLOOD PRESSURE SUPPORT)
PREFILLED_SYRINGE | INTRAVENOUS | Status: AC
  Filled 2024-06-09: qty 10

## 2024-06-09 MED ORDER — BRIMONIDINE TARTRATE 0.025 % OP SOLN: 1.0000 [drp] | Freq: Every morning | OPHTHALMIC | Status: DC

## 2024-06-09 MED ORDER — BUSPIRONE HCL 5 MG PO TABS
5.0000 mg | ORAL_TABLET | Freq: Every day | ORAL | Status: DC
  Filled 2024-06-09: qty 1

## 2024-06-09 MED ORDER — IRBESARTAN 150 MG PO TABS
300.0000 mg | ORAL_TABLET | Freq: Every day | ORAL | Status: DC
  Administered 2024-06-10 – 2024-06-12 (×3): 300 mg via ORAL
  Filled 2024-06-09 (×3): qty 2

## 2024-06-09 MED ORDER — METOPROLOL SUCCINATE ER 25 MG PO TB24
25.0000 mg | ORAL_TABLET | Freq: Every day | ORAL | Status: DC
  Administered 2024-06-10 – 2024-06-12 (×2): 25 mg via ORAL
  Filled 2024-06-09 (×3): qty 1

## 2024-06-09 MED ORDER — ACETAMINOPHEN 10 MG/ML IV SOLN: 1000.0000 mg | Freq: Once | INTRAVENOUS | Status: DC | PRN

## 2024-06-09 MED ORDER — GLYCOPYRROLATE 0.2 MG/ML IJ SOLN
INTRAMUSCULAR | Status: AC
  Filled 2024-06-09: qty 1

## 2024-06-09 MED ORDER — METOCLOPRAMIDE HCL 5 MG/ML IJ SOLN
INTRAMUSCULAR | Status: AC
  Filled 2024-06-09: qty 2

## 2024-06-09 MED ORDER — LIDOCAINE HCL (PF) 2 % IJ SOLN
INTRAMUSCULAR | Status: AC
  Filled 2024-06-09: qty 5

## 2024-06-09 MED ORDER — MORPHINE SULFATE (PF) 2 MG/ML IV SOLN: 1.0000 mg | INTRAVENOUS | Status: DC | PRN

## 2024-06-09 MED ORDER — ACETAMINOPHEN 160 MG/5ML PO SOLN
1000.0000 mg | Freq: Three times a day (TID) | ORAL | Status: DC
  Administered 2024-06-10 – 2024-06-11 (×3): 1000 mg via ORAL
  Filled 2024-06-09 (×5): qty 40.6

## 2024-06-09 MED ORDER — ONDANSETRON HCL 4 MG/2ML IJ SOLN: 4.0000 mg | Freq: Once | INTRAMUSCULAR | Status: DC | PRN

## 2024-06-09 MED ORDER — ENSURE MAX PROTEIN PO LIQD
2.0000 [oz_av] | ORAL | Status: DC
  Administered 2024-06-10 – 2024-06-12 (×20): 2 [oz_av] via ORAL

## 2024-06-09 MED ORDER — FENTANYL CITRATE (PF) 100 MCG/2ML IJ SOLN
INTRAMUSCULAR | Status: DC | PRN
  Administered 2024-06-09 (×2): 25 ug via INTRAVENOUS
  Administered 2024-06-09: 50 ug via INTRAVENOUS

## 2024-06-09 MED ORDER — LACTATED RINGERS IV SOLN: INTRAVENOUS | Status: DC

## 2024-06-09 MED ORDER — ROCURONIUM BROMIDE 10 MG/ML (PF) SYRINGE
PREFILLED_SYRINGE | INTRAVENOUS | Status: AC
  Filled 2024-06-09: qty 10

## 2024-06-09 MED ORDER — LORATADINE 10 MG PO TABS
10.0000 mg | ORAL_TABLET | Freq: Every day | ORAL | Status: DC
  Administered 2024-06-10 – 2024-06-12 (×3): 10 mg via ORAL
  Filled 2024-06-09 (×3): qty 1

## 2024-06-09 MED ORDER — ASPIRIN 81 MG PO TBEC
81.0000 mg | DELAYED_RELEASE_TABLET | Freq: Every day | ORAL | Status: DC
  Administered 2024-06-09 – 2024-06-12 (×4): 81 mg via ORAL
  Filled 2024-06-09 (×4): qty 1

## 2024-06-09 MED ORDER — MIDAZOLAM HCL 5 MG/5ML IJ SOLN
INTRAMUSCULAR | Status: DC | PRN
  Administered 2024-06-09: 2 mg via INTRAVENOUS

## 2024-06-09 MED ORDER — 0.9 % SODIUM CHLORIDE (POUR BTL) OPTIME
TOPICAL | Status: DC | PRN
  Administered 2024-06-09: 1000 mL

## 2024-06-09 MED ORDER — HEPARIN SODIUM (PORCINE) 5000 UNIT/ML IJ SOLN
5000.0000 [IU] | INTRAMUSCULAR | Status: AC
  Administered 2024-06-09: 5000 [IU] via SUBCUTANEOUS
  Filled 2024-06-09: qty 1

## 2024-06-09 MED ORDER — METOCLOPRAMIDE HCL 5 MG/ML IJ SOLN
INTRAMUSCULAR | Status: DC | PRN
  Administered 2024-06-09: 5 mg via INTRAVENOUS

## 2024-06-09 MED ORDER — DEXAMETHASONE SOD PHOSPHATE PF 10 MG/ML IJ SOLN
INTRAMUSCULAR | Status: DC | PRN
  Administered 2024-06-09: 8 mg via INTRAVENOUS

## 2024-06-09 MED ORDER — OXYCODONE HCL 5 MG/5ML PO SOLN
5.0000 mg | Freq: Four times a day (QID) | ORAL | Status: DC | PRN
  Administered 2024-06-09 – 2024-06-12 (×5): 5 mg via ORAL
  Filled 2024-06-09 (×5): qty 5

## 2024-06-09 MED ORDER — BUPIVACAINE-EPINEPHRINE (PF) 0.25% -1:200000 IJ SOLN
INTRAMUSCULAR | Status: AC
  Filled 2024-06-09: qty 60

## 2024-06-09 MED ORDER — PANTOPRAZOLE SODIUM 40 MG IV SOLR
40.0000 mg | Freq: Every day | INTRAVENOUS | Status: DC
  Administered 2024-06-09 – 2024-06-11 (×3): 40 mg via INTRAVENOUS
  Filled 2024-06-09 (×3): qty 10

## 2024-06-09 MED ORDER — ONDANSETRON HCL 4 MG/2ML IJ SOLN
INTRAMUSCULAR | Status: AC
  Filled 2024-06-09: qty 2

## 2024-06-09 MED ORDER — HYDROCHLOROTHIAZIDE 25 MG PO TABS
25.0000 mg | ORAL_TABLET | Freq: Every day | ORAL | Status: DC
  Administered 2024-06-10 – 2024-06-12 (×3): 25 mg via ORAL
  Filled 2024-06-09 (×3): qty 1

## 2024-06-09 MED ORDER — SCOPOLAMINE 1 MG/3DAYS TD PT72
1.0000 | MEDICATED_PATCH | TRANSDERMAL | Status: DC
  Administered 2024-06-09: 1 mg via TRANSDERMAL
  Filled 2024-06-09: qty 1

## 2024-06-09 MED ORDER — ENOXAPARIN (LOVENOX) PATIENT EDUCATION KIT
1.0000 | PACK | Freq: Once | 0 refills | Status: AC
  Filled 2024-06-09: qty 1, 1d supply, fill #0

## 2024-06-09 MED ORDER — EPHEDRINE 5 MG/ML INJ
INTRAVENOUS | Status: AC
  Filled 2024-06-09: qty 5

## 2024-06-09 MED ORDER — FENTANYL CITRATE (PF) 50 MCG/ML IJ SOSY
PREFILLED_SYRINGE | INTRAMUSCULAR | Status: AC
  Filled 2024-06-09: qty 1

## 2024-06-09 MED ORDER — SUGAMMADEX SODIUM 200 MG/2ML IV SOLN
INTRAVENOUS | Status: DC | PRN
  Administered 2024-06-09: 200 mg via INTRAVENOUS

## 2024-06-09 MED ORDER — STERILE WATER FOR IRRIGATION IR SOLN
Status: DC | PRN
  Administered 2024-06-09: 1000 mL

## 2024-06-09 MED ORDER — FENTANYL CITRATE (PF) 50 MCG/ML IJ SOSY
25.0000 ug | PREFILLED_SYRINGE | INTRAMUSCULAR | Status: DC | PRN
  Administered 2024-06-09 (×3): 50 ug via INTRAVENOUS

## 2024-06-09 MED ORDER — GLYCOPYRROLATE 0.2 MG/ML IJ SOLN
INTRAMUSCULAR | Status: DC | PRN
  Administered 2024-06-09: .2 mg via INTRAVENOUS

## 2024-06-09 MED ORDER — LIDOCAINE HCL (CARDIAC) PF 100 MG/5ML IV SOSY
PREFILLED_SYRINGE | INTRAVENOUS | Status: DC | PRN
  Administered 2024-06-09: 100 mg via INTRAVENOUS

## 2024-06-09 MED ORDER — MIDAZOLAM HCL 2 MG/2ML IJ SOLN
INTRAMUSCULAR | Status: AC
  Filled 2024-06-09: qty 2

## 2024-06-09 MED ORDER — ATORVASTATIN CALCIUM 10 MG PO TABS
10.0000 mg | ORAL_TABLET | Freq: Every day | ORAL | Status: DC
  Administered 2024-06-09 – 2024-06-12 (×4): 10 mg via ORAL
  Filled 2024-06-09 (×4): qty 1

## 2024-06-09 MED ORDER — PROPOFOL 10 MG/ML IV BOLUS
INTRAVENOUS | Status: DC | PRN
  Administered 2024-06-09: 200 mg via INTRAVENOUS

## 2024-06-09 MED ORDER — FENTANYL CITRATE (PF) 100 MCG/2ML IJ SOLN
INTRAMUSCULAR | Status: AC
  Filled 2024-06-09: qty 2

## 2024-06-09 MED ORDER — ENOXAPARIN (LOVENOX) PATIENT EDUCATION KIT
PACK | Freq: Once | Status: AC
  Filled 2024-06-09: qty 1

## 2024-06-09 MED ORDER — ONDANSETRON HCL 4 MG/2ML IJ SOLN
INTRAMUSCULAR | Status: DC | PRN
Start: 2024-06-09 — End: 2024-06-09
  Administered 2024-06-09: 4 mg via INTRAVENOUS

## 2024-06-09 MED ORDER — HEPARIN SODIUM (PORCINE) 5000 UNIT/ML IJ SOLN
5000.0000 [IU] | Freq: Three times a day (TID) | INTRAMUSCULAR | Status: DC
  Administered 2024-06-09 – 2024-06-12 (×8): 5000 [IU] via SUBCUTANEOUS
  Filled 2024-06-09 (×8): qty 1

## 2024-06-09 MED ORDER — SODIUM CHLORIDE 0.9 % IV SOLN
2.0000 g | INTRAVENOUS | Status: AC
  Administered 2024-06-09: 2 g via INTRAVENOUS
  Filled 2024-06-09: qty 2

## 2024-06-09 SURGICAL SUPPLY — 51 items
BAG COUNTER SPONGE SURGICOUNT (BAG) ×1 IMPLANT
BLADE SURG SZ11 CARB STEEL (BLADE) ×1 IMPLANT
CANNULA REDUCER 12-8 DVNC XI (CANNULA) ×1 IMPLANT
CHLORAPREP W/TINT 26 (MISCELLANEOUS) ×2 IMPLANT
CLIP APPLIE ROT 10 11.4 M/L (STAPLE) IMPLANT
COVER SURGICAL LIGHT HANDLE (MISCELLANEOUS) ×1 IMPLANT
COVER TIP SHEARS 8 DVNC (MISCELLANEOUS) IMPLANT
DERMABOND ADVANCED .7 DNX12 (GAUZE/BANDAGES/DRESSINGS) ×1 IMPLANT
DRAPE ARM DVNC X/XI (DISPOSABLE) ×4 IMPLANT
DRAPE COLUMN DVNC XI (DISPOSABLE) ×1 IMPLANT
DRIVER NDL MEGA SUTCUT DVNCXI (INSTRUMENTS) ×1 IMPLANT
DRIVER NDLE MEGA SUTCUT DVNCXI (INSTRUMENTS) ×1 IMPLANT
ELECT REM PT RETURN 15FT ADLT (MISCELLANEOUS) ×1 IMPLANT
GAUZE 4X4 16PLY ~~LOC~~+RFID DBL (SPONGE) ×1 IMPLANT
GLOVE BIO SURGEON STRL SZ7.5 (GLOVE) ×2 IMPLANT
GLOVE INDICATOR 8.0 STRL GRN (GLOVE) ×2 IMPLANT
GOWN STRL REUS W/ TWL XL LVL3 (GOWN DISPOSABLE) ×2 IMPLANT
GRASPER SUT TROCAR 14GX15 (MISCELLANEOUS) ×1 IMPLANT
GRASPER TIP-UP FEN DVNC XI (INSTRUMENTS) ×1 IMPLANT
HEMOSTAT SNOW SURGICEL 2X4 (HEMOSTASIS) IMPLANT
IRRIGATION SUCT STRKRFLW 2 WTP (MISCELLANEOUS) ×1 IMPLANT
KIT BASIN OR (CUSTOM PROCEDURE TRAY) ×1 IMPLANT
KIT TURNOVER KIT A (KITS) ×1 IMPLANT
LUBRICANT JELLY K Y 4OZ (MISCELLANEOUS) IMPLANT
MARKER SKIN DUAL TIP RULER LAB (MISCELLANEOUS) IMPLANT
MAT PREVALON FULL STRYKER (MISCELLANEOUS) ×1 IMPLANT
NDL SPNL 18GX3.5 QUINCKE PK (NEEDLE) ×1 IMPLANT
NEEDLE SPNL 18GX3.5 QUINCKE PK (NEEDLE) ×1 IMPLANT
OBTURATOR OPTICALSTD 8 DVNC (TROCAR) ×1 IMPLANT
PACK CARDIOVASCULAR III (CUSTOM PROCEDURE TRAY) ×1 IMPLANT
RELOAD STAPLE 60 2.5 WHT DVNC (STAPLE) IMPLANT
RELOAD STAPLE 60 3.5 BLU DVNC (STAPLE) IMPLANT
RELOAD STAPLE 60 4.3 GRN DVNC (STAPLE) IMPLANT
SCISSORS LAP 5X35 DISP (ENDOMECHANICALS) IMPLANT
SCISSORS MNPLR CVD DVNC XI (INSTRUMENTS) ×1 IMPLANT
SEAL UNIV 5-12 XI (MISCELLANEOUS) ×4 IMPLANT
SEALER VESSEL EXT DVNC XI (MISCELLANEOUS) ×1 IMPLANT
SET TUBE SMOKE EVAC HIGH FLOW (TUBING) ×1 IMPLANT
SLEEVE GASTRECTOMY 40FR VISIGI (MISCELLANEOUS) IMPLANT
SOLUTION ANTFG W/FOAM PAD STRL (MISCELLANEOUS) ×1 IMPLANT
SOLUTION ELECTROSURG ANTI STCK (MISCELLANEOUS) ×1 IMPLANT
SPIKE FLUID TRANSFER (MISCELLANEOUS) ×1 IMPLANT
STAPLER 60 SUREFORM DVNC (STAPLE) ×1 IMPLANT
SUT MNCRL AB 4-0 PS2 18 (SUTURE) ×2 IMPLANT
SUT VIC AB 0 CT1 27XBRD ANTBC (SUTURE) ×1 IMPLANT
SUT VIC AB 2-0 SH 27XBRD (SUTURE) ×1 IMPLANT
SYR 20ML LL LF (SYRINGE) ×1 IMPLANT
TOWEL OR DSP ST BLU DLX 10/PK (DISPOSABLE) ×1 IMPLANT
TRAY FOLEY MTR SLVR 16FR STAT (SET/KITS/TRAYS/PACK) IMPLANT
TROCAR ADV FIXATION 12X100MM (TROCAR) IMPLANT
TROCAR Z-THREAD FIOS 5X100MM (TROCAR) ×1 IMPLANT

## 2024-06-09 NOTE — Anesthesia Procedure Notes (Signed)
 Procedure Name: Intubation Date/Time: 06/09/2024 7:50 AM  Performed by: Nada Corean CROME, CRNAPre-anesthesia Checklist: Patient identified, Emergency Drugs available, Suction available, Patient being monitored and Timeout performed Patient Re-evaluated:Patient Re-evaluated prior to induction Oxygen Delivery Method: Circle system utilized Preoxygenation: Pre-oxygenation with 100% oxygen Induction Type: IV induction Ventilation: Oral airway inserted - appropriate to patient size, Mask ventilation without difficulty and Two handed mask ventilation required Laryngoscope Size: Glidescope and 3 Grade View: Grade I Tube type: Oral Tube size: 7.0 mm Number of attempts: 2 Airway Equipment and Method: Stylet and Video-laryngoscopy Placement Confirmation: ETT inserted through vocal cords under direct vision, positive ETCO2 and breath sounds checked- equal and bilateral Secured at: 22 cm Dental Injury: Teeth and Oropharynx as per pre-operative assessment

## 2024-06-09 NOTE — Anesthesia Postprocedure Evaluation (Signed)
 Anesthesia Post Note  Patient: Amy Diaz  Procedure(s) Performed: GASTRECTOMY, SLEEVE, ROBOT-ASSISTED ENDOSCOPY, UPPER GI TRACT     Patient location during evaluation: PACU Anesthesia Type: General Level of consciousness: awake Pain management: pain level controlled Vital Signs Assessment: post-procedure vital signs reviewed and stable Respiratory status: spontaneous breathing and nonlabored ventilation Cardiovascular status: blood pressure returned to baseline Postop Assessment: no apparent nausea or vomiting Anesthetic complications: no   No notable events documented.                Lauraine KATHEE [place]

## 2024-06-09 NOTE — Op Note (Signed)
 Patient: Amy Diaz ([birth year], [id number])  Date of Surgery: 06/09/2024  Preoperative Diagnosis: MORBID OBESITY   Postoperative Diagnosis: MORBID OBESITY   Surgical Procedure: GASTRECTOMY, SLEEVE, ROBOT-ASSISTED  ENDOSCOPY, UPPER GI TRACT    Operative Team Members:  Surgeons and Role:    * Paolo Okane, Deward PARAS, MD - Primary    * Maczis, Tonja Barban, [place] - Assisting   Anesthesiologist: Waddell Lauraine NOVAK, MD CRNA: Nada Corean CROME, CRNA; Williford, Peggy D, CRNA   Anesthesia: General   Fluids:  Total I/O In: 900 [I.V.:800; IV Piggyback:100] Out: -   Complications: * No complications entered in OR log *  Drains:  none   Specimen:  ID Type Source Tests Collected by Time Destination  1 : Stomach Tissue PATH GI benign resection SURGICAL PATHOLOGY Temekia Caskey, Deward PARAS, MD 06/09/2024 0920      Disposition:  PACU - hemodynamically stable.  Plan of Care: Discharge to home after PACU    Indications for Procedure: Amy Diaz is a 53 y.o. female who presented with obesity BMI 42.77, here for robotic sleeve gastrectomy with upper endoscopy. We discussed the procedure, its risks, benefits and alternatives, and the patient granted consent to proceed. We will proceed as scheduled.   Findings: Normal anatomy  Infection status: Patient: Private Patient Elective Case Case: Elective Infection Present At Time Of Surgery (PATOS): None   Description of Procedure:   On the date stated above, the patient was taken to the operating room suite and placed in supine positioning.  General endotracheal anesthesia was induced.  A timeout was completed verifying the correct patient, procedure, positioning and equipment needed for the case.  The patient's abdomen was prepped and draped in the usual sterile fashion.  I entered the patient's right upper quadrant using a 5 mm trocar in the optical technique.  There was no trauma to underlying viscera with initial trocar placement.  The  abdomen was insufflated 15 mmHg.  A total of 4 robotic trochars were placed across the mid abdomen, including the 5 mm initial trocar being upsized to a 8 mm trocar.  The robotic stapler trocar was placed in the number two position.  The [facility] liver retractor was placed through the subxiphoid region and under the left lobe of the liver and was connected to the rail of the bed.  A TAP block was placed using marcaine  and Exparel  under direct vision of the laparoscope.  The [employer] XI robotic platform was docked and we transitioned to robotic surgery.   Using the tip up fenestrated grasper, fenestrated bipolar, 30 degree camera and Vessel Sealer from the patient's right to left, we began by dissecting the angle of His off the left crus of the diaphragm.  The adhesions between the stomach, spleen and diaphragm were divided using the Vessel Sealer to define the angle of His.    I then started 6 cm away from the pylorus along the greater curve the stomach and divided the gastroepiploic vessels and the gastrocolic ligament.  The lesser sac was entered.  There were really no adhesions to the posterior wall of the stomach.  The greater curve was mobilized working superiorly toward the spleen.  All of the gastroepiploic and short gastric vessels were divided as we divided the gastrocolic and gastrosplenic ligaments.  As we reached the splenic hilum, I lifted the stomach anteriorly.  I created a tunnel between the stomach and its attachments to the retroperitoneum posteriorly just to the left of the GE junction until I encountered  the left crus and my previous angle of His dissection.  We then were able to approach the shortest of the short gastrics both from the greater curve the stomach laterally and from the left crus medially.  These were divided using the Vessel Sealer and the fundus of the stomach was fully mobilized.  With the stomach fully mobilized we direct our attention to stapling.  A 40 French VISI G was  inserted into the stomach and positioned along the lesser curve the stomach and suction was applied.  The 60 mm robotic sureform linear stapler was used to create the sleeve gastrectomy.  We started 6 cm from the pylorus and were careful to avoid narrowing at the incisura.  We stayed about 1 cm away from the GE junction to protect the sling fibers.  We used blue and white loads of the linear stapler.  With the sleeve gastrectomy completed the VISI G was taken off suction and removed and we performed an upper endoscopy.  The intra-abdominal pressure was decreased to to check hemostasis.  The foregut was submerged in saline irrigation and the adult upper endoscope was inserted into the stomach as far as the pylorus to inspect the sleeve.  The sleeve appeared appropriately oriented without any twisting.  There was good hemostasis.  The sleeve was widely patent at the incisura with no narrowing.  There was no significant retained fundus.  The sleeve was inflated with the endoscope and there was no bubbling of the irrigation, suggesting a negative leak test and a airtight sleeve gastrectomy.  The foregut was decompressed with the endoscope and the endoscope was removed.  An omentopexy was performed, using running 2-0 vicryl suture to connect the inferior two staple lines to the divided omentum.  There was good hemostasis at the end of the case.  The robot was undocked and moved away from the field.  The sleeve gastrectomy specimen was removed from the stapler port.  The fascia of the stapler port was closed using a 0 Vicryl on a PMI suture passer.   The liver retractor was removed under direct vision.  The pneumoperitoneum was evacuated.  The skin was closed using 4-0 Monocryl and Dermabond.  All sponge and needle counts were correct at the end of the case.    Deward Foy, MD General, Bariatric, & Minimally Invasive Surgery [facility], [place]

## 2024-06-09 NOTE — Progress Notes (Signed)
 Discussed QI Goals for Discharge document with patient including ambulation in halls, Incentive Spirometry use every hour, and oral care.  Also discussed pain and nausea control.  Pt sitting in recliner, son at bedside.  BSTOP education provided including BSTOP information guide, Guide for Pain Management after your Bariatric Procedure.  Lovenox education kit at bedside, pharmacy recommended Lovenox at discharge.  Diet progression education provided including Bariatric Surgery Post-Op Food Plan Phase 1: Liquids.  Questions answered.  Will continue to partner with bedside RN and follow up with patient per protocol.

## 2024-06-09 NOTE — H&P (Signed)
 Admitting Physician: Deward PARAS Amy Diaz  Service: Bariatric Surgery  CC: Obesity  Subjective   HPI: Amy Diaz is an 53 y.o. female who is here for robotic sleeve gastrectomy  Past Medical History:  Diagnosis Date   Abnormal mammogram    Allergy    on zyrtec - seasonal   Arthritis    back   Back pain    Back pain    Complication of anesthesia    had cough after receiving propofolol for approx 2 weeks   Dysrhythmia    Edema    Enthesopathy    GERD (gastroesophageal reflux disease)    Glaucoma    no drops so far early onset per pt   H/O degenerative disc disease    Heartburn    High cholesterol    Hypertension    Internal derangement of right knee 06/06/2018   Joint pain    Prediabetes    Sleep apnea    diag 08/2023 has not used in 4 months per pt   Swelling     Past Surgical History:  Procedure Laterality Date   CARPAL TUNNEL RELEASE Right    CESAREAN SECTION     x1   CYST REMOVAL TRUNK     ECTOPIC PREGNANCY SURGERY     MASS EXCISION Right 09/25/2023   Procedure: EXCISION RIGHT CHEST SEBACEOUS CYST;  Surgeon: Lyndel Deward PARAS, MD;  Location: [facility] ORS;  Service: General;  Laterality: Right;  90   TOTAL ABDOMINAL HYSTERECTOMY     WISDOM TOOTH EXTRACTION      Family History  Problem Relation Age of Onset   Hyperlipidemia Mother    Thyroid  disease Mother    Hypertension Mother    Hyperlipidemia Father    Cancer Father    Hypertension Father    Prostate cancer Father    Anxiety disorder Father    Colon polyps Maternal Aunt    Colon polyps Maternal Aunt    Colon polyps Cousin    Colon polyps Cousin    Colon cancer Neg Hx    Esophageal cancer Neg Hx    Rectal cancer Neg Hx    Stomach cancer Neg Hx     Social:  reports that she has never smoked. She has never been exposed to tobacco smoke. She has never used smokeless tobacco. She reports that she does not drink alcohol and does not use drugs.  Allergies:  Allergies  Allergen Reactions    Lisinopril Cough   Sumatriptan Other (See Comments)    chest tightness    Medications: Current Outpatient Medications  Medication Instructions   AMBULATORY NON FORMULARY MEDICATION Continuous positive airway pressure (CPAP) machine set on AutoPAP (4-20 cmH2O), with all supplemental supplies as needed.   Aspirin  Low Dose 81 mg, Oral, Daily, Swallow whole.   atorvastatin  (LIPITOR) 10 mg, Oral, Daily   BIOTIN PO 1 tablet, Daily   Brimonidine Tartrate (LUMIFY) 0.025 % SOLN 1 drop, Every morning   busPIRone  (BUSPAR ) 5-15 mg, Oral, 2 times daily PRN   celecoxib  (CELEBREX ) 100 mg, Oral, Daily   cetirizine  (ZYRTEC ) 10 mg, Oral, Daily   diclofenac Sodium (VOLTAREN) 4 g, Topical, 4 times daily, Apply to affected areas 4 times daily as needed for pain.   famotidine  (PEPCID ) 40 mg, Oral, Daily at bedtime   Fluocinolone  Acetonide Body 0.01 % OIL 1 application , Apply externally, Daily   furosemide  (LASIX ) 20 mg, Oral, Daily PRN   hydrOXYzine  (ATARAX ) 10-20 mg, Oral, 3 times daily PRN   methocarbamol  (  ROBAXIN ) 500 mg, Oral, 3 times daily   methylPREDNISolone  (MEDROL  DOSEPAK) 4 MG TBPK tablet Take 24 mg on day 1, 20 mg on day 2, 16 mg on day 3, 12 mg on day 4, 8 mg on day 5, 4 mg on day 6.  Take all tablets in each row at once, do not spread tablets out throughout the day.   metoprolol  succinate (TOPROL -XL) 25 mg, Oral, Daily   Multiple Vitamins-Minerals (BARIATRIC MULTIVITAMINS PO) 1 tablet, Oral, Daily   omeprazole  (PRILOSEC) 40 mg, Oral, Daily before supper   Propylene Glycol (SYSTANE COMPLETE) 0.6 % SOLN 1 drop, Daily PRN   thiamine (VITAMIN B1) 100 mg, Daily   traZODone  (DESYREL ) 50-100 mg, Oral, At bedtime PRN   valsartan -hydrochlorothiazide  (DIOVAN -HCT) 320-25 MG tablet 0.5 tablets, Oral, Daily   VITAMIN D  PO 4,000 Units, Every morning    ROS - all of the below systems have been reviewed with the patient and positives are indicated with bold text General: chills, fever or night  sweats Eyes: blurry vision or double vision ENT: epistaxis or sore throat Allergy/Immunology: itchy/watery eyes or nasal congestion Hematologic/Lymphatic: bleeding problems, blood clots or swollen lymph nodes Endocrine: temperature intolerance or unexpected weight changes Breast: new or changing breast lumps or nipple discharge Resp: cough, shortness of breath, or wheezing CV: chest pain or dyspnea on exertion GI: as per HPI GU: dysuria, trouble voiding, or hematuria MSK: joint pain or joint stiffness Neuro: TIA or stroke symptoms Derm: pruritus and skin lesion changes Psych: anxiety and depression  Objective   PE Blood pressure 137/74, pulse (!) 54, temperature 98.4 F (36.9 C), temperature source Oral, resp. rate 16, height 5' 2.5 (1.588 m), weight 107.8 kg, SpO2 99%. Constitutional: NAD; conversant; no deformities Eyes: Moist conjunctiva; no lid lag; anicteric; PERRL Neck: Trachea midline; no thyromegaly Lungs: Normal respiratory effort; no tactile fremitus CV: RRR; no palpable thrills; no pitting edema GI: Abd Soft, nontender; no palpable hepatosplenomegaly MSK: Normal range of motion of extremities; no clubbing/cyanosis Psychiatric: Appropriate affect; alert and oriented x3 Lymphatic: No palpable cervical or axillary lymphadenopathy  No results found for this or any previous visit (from the past 24 hours).  Imaging Orders  No imaging studies ordered today     Assessment and Plan   Amy Diaz is an 53 y.o. female with obesity, here for robotic sleeve gastrectomy with upper endoscopy.  We discussed the procedure, its risks, benefits and alternatives, and the patient granted consent to proceed.  We will proceed as scheduled.    Deward JINNY Foy, MD  [facility] Use [email or web address] to contact on call provider

## 2024-06-09 NOTE — Progress Notes (Signed)
 PHARMACY CONSULT FOR:  Risk Assessment for Post-Discharge VTE Following Bariatric Surgery  Procedure* Sleeve gastrectomy  Sex F  Black race Y  Age (years) 53  BMI (kg/m2) 42.77  Operation duration (minutes) 69  History of VTE requiring treatment* No   Hypercoagulable condition* No  Liver disorder* No  Pre-op venous stasis No  Pre-op functional health status Independent   Previous foregut or bariatric surg No   Post-op surgical site infection No  Transfusion intra- or post-op* No  Unplanned readmission No  Unplanned reoperation No  GI perforation/leak/obstruction* No   *specific risk factors for portomesenteric venous thrombosis   Predicted probability of 30-day post-discharge VTE:    0.45 % estimated using the [facility] Calculator  Other patient-specific factors to consider: None   Recommendation for Discharge: Enoxaparin 40 mg [place] q12h x 2 weeks post-discharge   Amy Diaz is a 53 y.o. female who underwent a sleeve gastrectomy on 06/09/2024.   Case start: 0814 Case end: 0923   Allergies  Allergen Reactions   Lisinopril Cough   Sumatriptan Other (See Comments)    chest tightness    Patient Measurements: Height: 5' 2.5 (158.8 cm) Weight: 107.8 kg (237 lb 9.6 oz) IBW/kg (Calculated) : 51.25 Body mass index is 42.77 kg/m.  No results for input(s): WBC, HGB, HCT, PLT, APTT, CREATININE, LABCREA, CREAT24HRUR, MG, PHOS, ALBUMIN, PROT, AST, ALT, ALKPHOS, BILITOT, BILIDIR, IBILI in the last 72 hours. Estimated Creatinine Clearance: 72.1 mL/min (A) (by C-G formula based on SCr of 1.04 mg/dL (H)).    Past Medical History:  Diagnosis Date   Abnormal mammogram    Allergy    on zyrtec - seasonal   Arthritis    back   Back pain    Back pain    Complication of anesthesia    had cough after receiving propofolol for approx 2 weeks   Dysrhythmia    Edema    Enthesopathy    GERD (gastroesophageal  reflux disease)    Glaucoma    no drops so far early onset per pt   H/O degenerative disc disease    Heartburn    High cholesterol    Hypertension    Internal derangement of right knee 06/06/2018   Joint pain    Prediabetes    Sleep apnea    diag 08/2023 has not used in 4 months per pt   Swelling      Medications Prior to Admission  Medication Sig Dispense Refill Last Dose/Taking   aspirin  EC 81 MG tablet Take 1 tablet (81 mg total) by mouth daily. Swallow whole. 90 tablet 3 06/01/2024   atorvastatin  (LIPITOR) 10 MG tablet Take 1 tablet (10 mg total) by mouth daily. 90 tablet 3 06/08/2024   BIOTIN PO Take 1 tablet by mouth daily.   Past Week   Brimonidine Tartrate (LUMIFY) 0.025 % SOLN Place 1 drop into both eyes in the morning.   06/09/2024 Morning   busPIRone  (BUSPAR ) 5 MG tablet TAKE 1-3 TABLETS (5-15 MG TOTAL) BY MOUTH 2 (TWO) TIMES DAILY AS NEEDED. (Patient taking differently: Take 5 mg by mouth daily.) 540 tablet 1 06/09/2024 Morning   celecoxib  (CELEBREX ) 100 MG capsule Take 1 capsule (100 mg total) by mouth daily. 90 capsule 0 Past Week   cetirizine  (ZYRTEC ) 10 MG tablet Take 1 tablet (10 mg total) by mouth daily. 90 tablet 1 06/09/2024 Morning   diclofenac Sodium (VOLTAREN) 1 % GEL Apply 4 g topically 4 (four) times daily.  Apply to affected areas 4 times daily as needed for pain. (Patient taking differently: Apply 4 g topically daily as needed (pain).) 100 g 2 Taking Differently   famotidine  (PEPCID ) 40 MG tablet Take 1 tablet (40 mg total) by mouth at bedtime. (Patient taking differently: Take 40 mg by mouth daily as needed.) 90 tablet 1 Past Month   Fluocinolone  Acetonide Body 0.01 % OIL Apply 1 application  topically daily. (Patient taking differently: Apply 1 application  topically daily as needed (dry skin).) 118.28 mL 2 06/08/2024   methocarbamol  (ROBAXIN ) 500 MG tablet Take 1 tablet (500 mg total) by mouth 3 (three) times daily. 270 tablet 0 06/08/2024   metoprolol  succinate  (TOPROL -XL) 25 MG 24 hr tablet Take 1 tablet (25 mg total) by mouth daily. 90 tablet 0 06/09/2024 at  4:00 AM   omeprazole  (PRILOSEC) 40 MG capsule Take 1 capsule (40 mg total) by mouth daily before supper. (Patient taking differently: Take 40 mg by mouth every 14 (fourteen) days.) 90 capsule 0 06/09/2024 Morning   Propylene Glycol (SYSTANE COMPLETE) 0.6 % SOLN Place 1 drop into both eyes daily as needed (dry eyes).   06/08/2024   thiamine (VITAMIN B1) 100 MG tablet Take 100 mg by mouth daily.   Past Week   traZODone  (DESYREL ) 50 MG tablet Take 1-2 tablets (50-100 mg total) by mouth at bedtime as needed for sleep. 180 tablet 1 Taking As Needed   valsartan -hydrochlorothiazide  (DIOVAN -HCT) 320-25 MG tablet Take 0.5 tablets by mouth daily. (Patient taking differently: Take 1 tablet by mouth daily.)   06/08/2024   VITAMIN D  PO Take 4,000 Units by mouth in the morning.   Past Week   AMBULATORY NON FORMULARY MEDICATION Continuous positive airway pressure (CPAP) machine set on AutoPAP (4-20 cmH2O), with all supplemental supplies as needed. 1 each 0    furosemide  (LASIX ) 20 MG tablet Take 1 tablet (20 mg total) by mouth daily as needed. 30 tablet 1    hydrOXYzine  (ATARAX ) 10 MG tablet Take 1-2 tablets (10-20 mg total) by mouth 3 (three) times daily as needed. 180 tablet 0 More than a month   methylPREDNISolone  (MEDROL  DOSEPAK) 4 MG TBPK tablet Take 24 mg on day 1, 20 mg on day 2, 16 mg on day 3, 12 mg on day 4, 8 mg on day 5, 4 mg on day 6.  Take all tablets in each row at once, do not spread tablets out throughout the day. (Patient not taking: Reported on 05/27/2024) 21 tablet 0 Not Taking   Multiple Vitamins-Minerals (BARIATRIC MULTIVITAMINS PO) Take 1 tablet by mouth daily.          Amy Diaz 06/09/2024,7:06 AM

## 2024-06-09 NOTE — Transfer of Care (Signed)
 Immediate Anesthesia Transfer of Care Note  Patient: Amy Diaz  Procedure(s) Performed: GASTRECTOMY, SLEEVE, ROBOT-ASSISTED ENDOSCOPY, UPPER GI TRACT  Patient Location: PACU  Anesthesia Type:General  Level of Consciousness: awake, alert , oriented, and patient cooperative  Airway & Oxygen Therapy: Patient Spontanous Breathing and Patient connected to face mask oxygen  Post-op Assessment: Report given to RN and Post -op Vital signs reviewed and stable  Post vital signs: Reviewed and stable  Last Vitals:  Vitals Value Taken Time  BP 180/86 06/09/24 09:35  Temp 36.2 C 06/09/24 09:36  Pulse 73 06/09/24 09:38  Resp 18 06/09/24 09:38  SpO2 100 % 06/09/24 09:38  Vitals shown include unfiled device data.  Last Pain:  Vitals:   06/09/24 9361  TempSrc:   PainSc: 0-No pain         Complications: No notable events documented.

## 2024-06-09 NOTE — Anesthesia Preprocedure Evaluation (Addendum)
 Anesthesia Evaluation  Patient identified by MRN, date of birth, ID band Patient awake    Reviewed: Allergy & Precautions, NPO status , Patient's Chart, lab work & pertinent test results  History of Anesthesia Complications Negative for: history of anesthetic complications  Airway Mallampati: III  TM Distance: >3 FB Neck ROM: Full    Dental  (+) Teeth Intact, Dental Advisory Given   Pulmonary sleep apnea    breath sounds clear to auscultation       Cardiovascular hypertension, Pt. on medications (-) CAD (-) dysrhythmias  Rhythm:Regular Rate:Normal  EKG (01/2024): NSR   Neuro/Psych  Headaches    GI/Hepatic ,GERD  Controlled and Medicated,,  Endo/Other  Diabetes: Pre-Diabetes.    Renal/GU      Musculoskeletal  (+) Arthritis , Osteoarthritis,    Abdominal   Peds  Hematology   Anesthesia Other Findings   Reproductive/Obstetrics                              Anesthesia Physical Anesthesia Plan  ASA: 3  Anesthesia Plan: General   Post-op Pain Management:    Induction: Intravenous  PONV Risk Score and Plan: 3 and Ondansetron , Treatment may vary due to age or medical condition, Dexamethasone , Midazolam , Scopolamine patch - Pre-op and Metaclopromide  Airway Management Planned: Oral ETT  Additional Equipment: None  Intra-op Plan:   Post-operative Plan: Extubation in OR  Informed Consent:      Dental advisory given  Plan Discussed with: CRNA  Anesthesia Plan Comments:          Anesthesia Quick Evaluation

## 2024-06-10 ENCOUNTER — Encounter (HOSPITAL_COMMUNITY): Payer: Self-pay | Admitting: Surgery

## 2024-06-10 ENCOUNTER — Other Ambulatory Visit (HOSPITAL_COMMUNITY): Payer: Self-pay

## 2024-06-10 LAB — CBC WITH DIFFERENTIAL/PLATELET
Abs Immature Granulocytes: 0.03 K/uL (ref 0.00–0.07)
Basophils Absolute: 0 K/uL (ref 0.0–0.1)
Basophils Relative: 0 %
Eosinophils Absolute: 0 K/uL (ref 0.0–0.5)
Eosinophils Relative: 0 %
HCT: 36.1 % (ref 36.0–46.0)
Hemoglobin: 11.7 g/dL — ABNORMAL LOW (ref 12.0–15.0)
Immature Granulocytes: 0 %
Lymphocytes Relative: 21 %
Lymphs Abs: 2 K/uL (ref 0.7–4.0)
MCH: 29.7 pg (ref 26.0–34.0)
MCHC: 32.4 g/dL (ref 30.0–36.0)
MCV: 91.6 fL (ref 80.0–100.0)
Monocytes Absolute: 0.9 K/uL (ref 0.1–1.0)
Monocytes Relative: 9 %
Neutro Abs: 6.9 K/uL (ref 1.7–7.7)
Neutrophils Relative %: 70 %
Platelets: 318 K/uL (ref 150–400)
RBC: 3.94 MIL/uL (ref 3.87–5.11)
RDW: 14.1 % (ref 11.5–15.5)
WBC: 10 K/uL (ref 4.0–10.5)
nRBC: 0 % (ref 0.0–0.2)

## 2024-06-10 LAB — SURGICAL PATHOLOGY

## 2024-06-10 NOTE — Progress Notes (Signed)
 Progress Note: General Surgery Service   Chief Complaint/Subjective: Lots of LUQ pain  Objective: Vital signs in last 24 hours: Temp:  [97.5 F (36.4 C)-98.5 F (36.9 C)] 98 F (36.7 C) (11/11 0939) Pulse Rate:  [55-70] 55 (11/11 0939) Resp:  [15-21] 18 (11/11 0709) BP: (136-158)/(63-77) 147/63 (11/11 0939) SpO2:  [93 %-100 %] 100 % (11/11 0939) Last BM Date : 06/08/24  Intake/Output from previous day: 11/10 0701 - 11/11 0700 In: 1593 [P.O.:360; I.V.:1133; IV Piggyback:100] Out: 1050 [Urine:1050] Intake/Output this shift: Total I/O In: 60 [P.O.:60] Out: 300 [Urine:300]  GI: Abd LUQ pain, incisions c/d/i  Lab Results: CBC  Recent Labs    06/09/24 1341 06/10/24 0548  WBC 9.7 10.0  HGB 13.6 11.7*  HCT 41.0 36.1  PLT PLATELET CLUMPS NOTED ON SMEAR, UNABLE TO ESTIMATE 318   BMET Recent Labs    06/09/24 1341  CREATININE 1.20*   PT/INR No results for input(s): LABPROT, INR in the last 72 hours. ABG No results for input(s): PHART, HCO3 in the last 72 hours.  Invalid input(s): PCO2, PO2  Anti-infectives: Anti-infectives (From admission, onward)    Start     Dose/Rate Route Frequency Ordered Stop   06/09/24 0615  cefoTEtan (CEFOTAN) 2 g in sodium chloride  0.9 % 100 mL IVPB        2 g 200 mL/hr over 30 Minutes Intravenous On call to O.R. 06/09/24 0603 06/09/24 9185       Medications: Scheduled Meds:  acetaminophen   1,000 mg Oral Q8H   Or   acetaminophen  (TYLENOL ) oral liquid 160 mg/5 mL  1,000 mg Oral Q8H   aspirin  EC  81 mg Oral Daily   atorvastatin   10 mg Oral Daily   busPIRone   5 mg Oral Daily   heparin injection (subcutaneous)  5,000 Units Subcutaneous Q8H   irbesartan  300 mg Oral Daily   And   hydrochlorothiazide   25 mg Oral Daily   loratadine  10 mg Oral Daily   metoprolol  succinate  25 mg Oral Daily   pantoprazole (PROTONIX) IV  40 mg Intravenous QHS   Ensure Max Protein  2 oz Oral Q2H   Continuous Infusions:  lactated ringers   75 mL/hr at 06/09/24 1135   PRN Meds:.morphine injection, ondansetron  (ZOFRAN ) IV, oxyCODONE , simethicone  Assessment/Plan: s/p Procedure(s): GASTRECTOMY, SLEEVE, ROBOT-ASSISTED ENDOSCOPY, UPPER GI TRACT 06/09/2024  I think pain is due to liver retractor and port positions.  Sleeve leak, bleeding or other issues much less likely but considered.  Vitals stable, labs stable Monitor inpatient overnight and repeat labs in the morning Pain control, Ice    LOS: 1 day    Deward JINNY Foy, MD  [facility] Use [email or web address] to contact on call provider  Daily Billing: [phone] - post op

## 2024-06-10 NOTE — Progress Notes (Addendum)
 Patient lying in bed on right side. Just medicated for nausea and gas by bedside RN.  Describes left upper abdomen pain not associated with drinking fluids.  States that drinking protein works better than water for her.  Walked to bathroom, urine clear.  Patient using walker to walk to bathroom.  Encouraged ambulation in hallway, upon entering hallway patient felt lightheaded.  Assisted to recliner, dizziness resolved. Instructed to call staff when out of bed.  Family at bedside. Request for abdominal binder, discussed plan of care for patient with [facility] bedside nurse.  Will continue to monitor.

## 2024-06-10 NOTE — Progress Notes (Signed)
   06/10/24 1210  TOC Brief Assessment  Insurance and Status Reviewed  Patient has primary care physician Yes  Home environment has been reviewed resies in a private residence  Prior level of function: Independent  Prior/Current Home Services No current home services  Social Drivers of Health Review SDOH reviewed no interventions necessary  Readmission risk has been reviewed Yes  Transition of care needs no transition of care needs at this time

## 2024-06-10 NOTE — Discharge Instructions (Signed)

## 2024-06-11 ENCOUNTER — Other Ambulatory Visit (HOSPITAL_COMMUNITY): Payer: Self-pay

## 2024-06-11 ENCOUNTER — Inpatient Hospital Stay (HOSPITAL_COMMUNITY)

## 2024-06-11 LAB — CBC WITH DIFFERENTIAL/PLATELET
Abs Immature Granulocytes: 0.02 K/uL (ref 0.00–0.07)
Basophils Absolute: 0 K/uL (ref 0.0–0.1)
Basophils Relative: 0 %
Eosinophils Absolute: 0 K/uL (ref 0.0–0.5)
Eosinophils Relative: 0 %
HCT: 36.9 % (ref 36.0–46.0)
Hemoglobin: 12 g/dL (ref 12.0–15.0)
Immature Granulocytes: 0 %
Lymphocytes Relative: 51 %
Lymphs Abs: 4.1 K/uL — ABNORMAL HIGH (ref 0.7–4.0)
MCH: 30 pg (ref 26.0–34.0)
MCHC: 32.5 g/dL (ref 30.0–36.0)
MCV: 92.3 fL (ref 80.0–100.0)
Monocytes Absolute: 0.7 K/uL (ref 0.1–1.0)
Monocytes Relative: 8 %
Neutro Abs: 3.3 K/uL (ref 1.7–7.7)
Neutrophils Relative %: 41 %
Platelets: 330 K/uL (ref 150–400)
RBC: 4 MIL/uL (ref 3.87–5.11)
RDW: 14.2 % (ref 11.5–15.5)
WBC: 8.1 K/uL (ref 4.0–10.5)
nRBC: 0 % (ref 0.0–0.2)

## 2024-06-11 MED ORDER — IOHEXOL 9 MG/ML PO SOLN
ORAL | Status: AC
  Filled 2024-06-11: qty 500

## 2024-06-11 MED ORDER — PIPERACILLIN-TAZOBACTAM 3.375 G IVPB: 3.3750 g | Freq: Three times a day (TID) | INTRAVENOUS | Status: DC

## 2024-06-11 MED ORDER — IOHEXOL 350 MG/ML SOLN
100.0000 mL | Freq: Once | INTRAVENOUS | Status: AC | PRN
  Administered 2024-06-11: 100 mL via INTRAVENOUS

## 2024-06-11 MED ORDER — IOHEXOL 9 MG/ML PO SOLN
250.0000 mL | ORAL | Status: AC
  Administered 2024-06-11: 250 mL via ORAL

## 2024-06-11 NOTE — Progress Notes (Signed)
 Patient alert and oriented, pain is controlled. Patient is tolerating fluids, advanced to protein shake today, patient is tolerating well. Reviewed Gastric sleeve/bypass discharge instructions with patient and patient is able to articulate understanding. Provided information on BELT program, Support Group, BSTOP-D, and [facility] outpatient pharmacy. Communicated general update of patient status to surgeon. All questions answered. 24hr fluid recall is 900 per hydration protocol, bariatric nurse coordinator to make follow-up phone call within one week.

## 2024-06-11 NOTE — Plan of Care (Signed)
  Problem: Nutrition: Goal: Adequate nutrition will be maintained Outcome: Progressing   Problem: Elimination: Goal: Will not experience complications related to bowel motility Outcome: Progressing   Problem: Pain Managment: Goal: General experience of comfort will improve and/or be controlled Outcome: Progressing

## 2024-06-11 NOTE — Progress Notes (Signed)
 Patient back from CT, denies nausea only complaint is left chest pain.  States pain relieved when turning to right side or rubbing the area.  Patient. likes the water better than the protein today. Offered Bari clears broth SF jello.Await CT results. Will begin discharge education this morning.

## 2024-06-11 NOTE — Progress Notes (Signed)
 Progress Note: General Surgery Service   Chief Complaint/Subjective: Now the pain is in the left chest.  Difficult to take a deep breath  Objective: Vital signs in last 24 hours: Temp:  [97.7 F (36.5 C)-99.2 F (37.3 C)] 97.7 F (36.5 C) (11/12 0635) Pulse Rate:  [53-69] 67 (11/12 0635) Resp:  [16-18] 18 (11/12 0635) BP: (137-165)/(61-78) 150/66 (11/12 0635) SpO2:  [98 %-100 %] 99 % (11/12 0635) Last BM Date : 06/08/24  Intake/Output from previous day: 11/11 0701 - 11/12 0700 In: 840 [P.O.:840] Out: 2450 [Urine:2450] Intake/Output this shift: No intake/output data recorded.  GI: Abd LUQ pain, incisions c/d/i  Lab Results: CBC  Recent Labs    06/10/24 0548 06/11/24 0524  WBC 10.0 8.1  HGB 11.7* 12.0  HCT 36.1 36.9  PLT 318 330   BMET Recent Labs    06/09/24 1341  CREATININE 1.20*   PT/INR No results for input(s): LABPROT, INR in the last 72 hours. ABG No results for input(s): PHART, HCO3 in the last 72 hours.  Invalid input(s): PCO2, PO2  Anti-infectives: Anti-infectives (From admission, onward)    Start     Dose/Rate Route Frequency Ordered Stop   06/09/24 0615  cefoTEtan (CEFOTAN) 2 g in sodium chloride  0.9 % 100 mL IVPB        2 g 200 mL/hr over 30 Minutes Intravenous On call to O.R. 06/09/24 0603 06/09/24 9185       Medications: Scheduled Meds:  acetaminophen   1,000 mg Oral Q8H   Or   acetaminophen  (TYLENOL ) oral liquid 160 mg/5 mL  1,000 mg Oral Q8H   aspirin  EC  81 mg Oral Daily   atorvastatin   10 mg Oral Daily   busPIRone   5 mg Oral Daily   heparin injection (subcutaneous)  5,000 Units Subcutaneous Q8H   irbesartan  300 mg Oral Daily   And   hydrochlorothiazide   25 mg Oral Daily   loratadine  10 mg Oral Daily   metoprolol  succinate  25 mg Oral Daily   pantoprazole (PROTONIX) IV  40 mg Intravenous QHS   Ensure Max Protein  2 oz Oral Q2H   Continuous Infusions:   PRN Meds:.morphine injection, ondansetron  (ZOFRAN ) IV,  oxyCODONE , simethicone  Assessment/Plan: s/p Procedure(s): GASTRECTOMY, SLEEVE, ROBOT-ASSISTED ENDOSCOPY, UPPER GI TRACT 06/09/2024  Unfortunately pain continues today Check a CT PE to evaluate further Pain control, Ice    LOS: 2 days    Deward JINNY Foy, MD  [facility] Use [email or web address] to contact on call provider  Daily Billing: [phone] - post op

## 2024-06-12 ENCOUNTER — Telehealth: Payer: Self-pay

## 2024-06-12 ENCOUNTER — Other Ambulatory Visit (HOSPITAL_COMMUNITY): Payer: Self-pay

## 2024-06-12 MED ORDER — PANTOPRAZOLE SODIUM 40 MG PO TBEC
40.0000 mg | DELAYED_RELEASE_TABLET | Freq: Every day | ORAL | 0 refills | Status: DC
  Filled 2024-06-12: qty 90, 90d supply, fill #0

## 2024-06-12 MED ORDER — ACETAMINOPHEN 500 MG PO TABS: 1000.0000 mg | ORAL_TABLET | Freq: Three times a day (TID) | ORAL | Status: AC

## 2024-06-12 MED ORDER — ONDANSETRON 4 MG PO TBDP
4.0000 mg | ORAL_TABLET | Freq: Four times a day (QID) | ORAL | 0 refills | Status: DC | PRN
  Filled 2024-06-12: qty 20, 5d supply, fill #0

## 2024-06-12 MED ORDER — OXYCODONE HCL 5 MG PO TABS
5.0000 mg | ORAL_TABLET | Freq: Four times a day (QID) | ORAL | 0 refills | Status: DC | PRN
  Filled 2024-06-12: qty 20, 5d supply, fill #0

## 2024-06-12 MED ORDER — GABAPENTIN 100 MG PO CAPS
100.0000 mg | ORAL_CAPSULE | Freq: Two times a day (BID) | ORAL | 0 refills | Status: DC
  Filled 2024-06-12: qty 10, 5d supply, fill #0

## 2024-06-12 NOTE — Telephone Encounter (Signed)
 Left a message for a return call. She is due for a mammogram.

## 2024-06-12 NOTE — Plan of Care (Signed)
  Problem: Education: Goal: Knowledge of General Education information will improve Description: Including pain rating scale, medication(s)/side effects and non-pharmacologic comfort measures Outcome: Progressing   Problem: Health Behavior/Discharge Planning: Goal: Ability to manage health-related needs will improve Outcome: Progressing   Problem: Clinical Measurements: Goal: Ability to maintain clinical measurements within normal limits will improve Outcome: Progressing Goal: Will remain free from infection Outcome: Not Applicable Goal: Respiratory complications will improve Outcome: Progressing   Problem: Nutrition: Goal: Adequate nutrition will be maintained Outcome: Not Progressing   Problem: Activity: Goal: Risk for activity intolerance will decrease Outcome: Not Progressing   Problem: Coping: Goal: Level of anxiety will decrease Outcome: Progressing

## 2024-06-12 NOTE — Discharge Summary (Signed)
 Patient ID: Amy Diaz [id number] 53 y.o. 09/18/1970  06/09/2024  Discharge date and time: 06/12/2024  Admitting Physician: Deward PARAS [place] Dowd  Discharge Physician: Deward PARAS Coriana Angello  Admission Diagnoses: Morbid obesity (HCC) [E66.01] Morbid obesity with BMI of 40.0-44.9, adult (HCC) [E66.01, Z68.41] Patient Active Problem List   Diagnosis Date Noted   Morbid obesity with BMI of 40.0-44.9, adult (HCC) 06/09/2024   Cutaneous abscess of abdominal wall 08/24/2023   OSA on CPAP 07/02/2023   Insulin  resistance 05/15/2023   Vitamin D  deficiency 05/15/2023   Other fatigue 04/30/2023   SOBOE (shortness of breath on exertion) 04/30/2023   Hyperlipidemia 04/30/2023   DDD (degenerative disc disease), lumbar 04/30/2023   Polyphagia 04/30/2023   Depression screen 04/30/2023   BMI 40.0-44.9, adult (HCC) 04/30/2023   Primary osteoarthritis of both knees 02/20/2022   Prediabetes 11/03/2019   Morbid obesity (HCC) 11/03/2019   Insomnia 11/03/2019   Essential hypertension 11/03/2019   Cyst, dermoid, trunk 07/19/2018   Breast mass, right 10/15/2016   Migraine without aura or status migrainosus 12/18/2015   Leiomyoma 01/12/2012   Bilateral edema of lower extremity 09/19/2011     Discharge Diagnoses:  Patient Active Problem List   Diagnosis Date Noted   Morbid obesity with BMI of 40.0-44.9, adult (HCC) 06/09/2024   Cutaneous abscess of abdominal wall 08/24/2023   OSA on CPAP 07/02/2023   Insulin  resistance 05/15/2023   Vitamin D  deficiency 05/15/2023   Other fatigue 04/30/2023   SOBOE (shortness of breath on exertion) 04/30/2023   Hyperlipidemia 04/30/2023   DDD (degenerative disc disease), lumbar 04/30/2023   Polyphagia 04/30/2023   Depression screen 04/30/2023   BMI 40.0-44.9, adult (HCC) 04/30/2023   Primary osteoarthritis of both knees 02/20/2022   Prediabetes 11/03/2019   Morbid obesity (HCC) 11/03/2019   Insomnia 11/03/2019   Essential hypertension 11/03/2019    Cyst, dermoid, trunk 07/19/2018   Breast mass, right 10/15/2016   Migraine without aura or status migrainosus 12/18/2015   Leiomyoma 01/12/2012   Bilateral edema of lower extremity 09/19/2011    Operations: Procedure(s): GASTRECTOMY, SLEEVE, ROBOT-ASSISTED ENDOSCOPY, UPPER GI TRACT  Admission Condition: good  Discharged Condition: good  Indication for Admission: Obesity  Hospital Course: Robotic sleeve gastrectomy  Consults: None  Significant Diagnostic Studies: CT Abd/Pel and CT PE negative for postoperative complications  Treatments: surgery: as above  Disposition: Home  Patient Instructions:  Allergies as of 06/12/2024       Reactions   Lisinopril Cough   Sumatriptan Other (See Comments)   chest tightness        Medication List     STOP taking these medications    busPIRone  5 MG tablet Commonly known as: BUSPAR    celecoxib  100 MG capsule Commonly known as: CeleBREX    furosemide  20 MG tablet Commonly known as: LASIX    hydrOXYzine  10 MG tablet Commonly known as: ATARAX    methylPREDNISolone  4 MG Tbpk tablet Commonly known as: MEDROL  DOSEPAK   traZODone  50 MG tablet Commonly known as: DESYREL    valsartan -hydrochlorothiazide  320-25 MG tablet Commonly known as: DIOVAN -HCT       TAKE these medications    acetaminophen  500 MG tablet Commonly known as: TYLENOL  Take 2 tablets (1,000 mg total) by mouth every 8 (eight) hours for 5 days.   AMBULATORY NON FORMULARY MEDICATION Continuous positive airway pressure (CPAP) machine set on AutoPAP (4-20 cmH2O), with all supplemental supplies as needed.   Aspirin  Low Dose 81 MG tablet Generic drug: aspirin  EC Take 1 tablet (81 mg total) by mouth daily.  Swallow whole. Notes to patient: Avoid NSAIDs for 6-8 weeks after surgery    atorvastatin  10 MG tablet Commonly known as: LIPITOR Take 1 tablet (10 mg total) by mouth daily.   BARIATRIC MULTIVITAMINS PO Take 1 tablet by mouth daily.   BIOTIN PO Take  1 tablet by mouth daily.   cetirizine  10 MG tablet Commonly known as: ZYRTEC  Take 1 tablet (10 mg total) by mouth daily.   diclofenac Sodium 1 % Gel Commonly known as: VOLTAREN Apply 4 g topically 4 (four) times daily. Apply to affected areas 4 times daily as needed for pain. What changed:  when to take this reasons to take this additional instructions   enoxaparin 40 MG/0.4ML injection Commonly known as: LOVENOX Inject 0.4 mLs (40 mg total) into the skin every 12 (twelve) hours for 14 days.   famotidine  40 MG tablet Commonly known as: PEPCID  Take 1 tablet (40 mg total) by mouth at bedtime. What changed:  when to take this reasons to take this   Fluocinolone  Acetonide Body 0.01 % Oil Apply 1 application  topically daily. What changed:  how to take this when to take this reasons to take this   gabapentin 100 MG capsule Commonly known as: NEURONTIN Take 1 capsule (100 mg total) by mouth every 12 (twelve) hours for 5 days.   Lumify 0.025 % Soln Generic drug: Brimonidine Tartrate Place 1 drop into both eyes in the morning.   methocarbamol  500 MG tablet Commonly known as: ROBAXIN  Take 1 tablet (500 mg total) by mouth 3 (three) times daily.   metoprolol  succinate 25 MG 24 hr tablet Commonly known as: TOPROL -XL Take 1 tablet (25 mg total) by mouth daily. Notes to patient: Monitor Blood Pressure Daily and keep a log for primary care physician.  You may need to make changes to your medications with rapid weight loss.      omeprazole  40 MG capsule Commonly known as: PRILOSEC Take 1 capsule (40 mg total) by mouth daily before supper. What changed: when to take this   ondansetron  4 MG disintegrating tablet Commonly known as: ZOFRAN -ODT Take 1 tablet (4 mg total) by mouth every 6 (six) hours as needed for nausea or vomiting.   oxyCODONE  5 MG immediate release tablet Commonly known as: Oxy IR/ROXICODONE  Take 1 tablet (5 mg total) by mouth every 6 (six) hours as needed  for breakthrough pain or severe pain (pain score 7-10).   pantoprazole 40 MG tablet Commonly known as: PROTONIX Take 1 tablet (40 mg total) by mouth daily.   Systane Complete 0.6 % Soln Generic drug: Propylene Glycol Place 1 drop into both eyes daily as needed (dry eyes).   thiamine 100 MG tablet Commonly known as: VITAMIN B1 Take 100 mg by mouth daily.   VITAMIN D  PO Take 4,000 Units by mouth in the morning.       ASK your doctor about these medications    enoxaparin Kit Commonly known as: LOVENOX 1 kit by Does not apply route once for 1 dose. Ask about: Should I take this medication?        Activity: no heavy lifting for 4 weeks Diet: Bariatric Wound Care: keep wound clean and dry  Follow-up:  With Dr. Lyndel  Signed: Deward PARAS [facility] [place]   06/12/2024, 8:54 AM

## 2024-06-12 NOTE — Progress Notes (Signed)
 Discharge medications delivered to patient at the bedside in a secure bag.

## 2024-06-17 ENCOUNTER — Telehealth (HOSPITAL_COMMUNITY): Payer: Self-pay

## 2024-06-17 NOTE — Telephone Encounter (Signed)
 1. Tell me about your pain and pain management?    Pt denies any pain.   2. Let's talk about fluid intake. How much total fluid are you taking in?   Pt states that s/he is getting in at least  70oz of fluid including protein shakes, bottled water, and broth   3. How much protein have you taken in the last day?    Pt states she is meeting the goal of 60g of protein each day with the protein shakes and     4. Have you had nausea? Tell me about when you have experienced nausea and what you did to help?   Pt denies nausea.   5. Has the frequency or color changed with your urine?   Pt states that s/he is urinating fine with no changes in frequency or urgency. Straw color but had lightened.    6. Tell me what your incisions look like?   Incisions look fine. Pt denies a fever, chills. Pt states incisions are not swollen, open, or draining. Pt encouraged to call [facility] if incisions change.    7. Have you been passing gas? BM?   Pt states that they are having BMs. Last BM 11/17  Pt states that they have had a BM. Pt instructed to take either Miralax or MoM as instructed per Gastric Bypass/Sleeve Discharge Home Care Instructions. Pt to call surgeon's office if not able to have BM with medication.      8. If a problem or question were to arise who would you call? Do you know contact numbers for [facility], [facility], and [facility]?   Pt knows to call [facility] for surgical, [facility] for nutrition, and [facility] for non-urgent questions or concerns. Pt denies dehydration symptoms. Pt can describe s/sx of dehydration.   9. How has the walking going?   Pt states s/he is walking around and able to be active without difficulty sometime can get SOB with exertion but she will sit down and feels fine.  Denies SOB at rest  10. Are you still using your incentive spirometer? If so, how often?   Pt states that s/he is doing the I.S. Pt encouraged to use incentive spirometer, at least 10x every hour while awake until s/he sees  the surgeon.   11. How are your vitamins and calcium  going? How are you taking them?    Pt states that s/he is taking her supplements and vitamins as directed. MVI does not taste good and wants to look at different vitamin.  Discussed to reach out to [facility] if feels that additional options are needed   12. How has the anticoagulant Lovenox been going?   LOVENOX: Pt states that s/he is taking the Lovenox injections without difficulty. Reinforced education about taking injections q12h and rotating injection sites. Pt also instructed to monitor for unusual bruising and/or signs of bleeding.   Reminded patient that the first 30 days post-operatively are important for successful recovery. Practice good hand hygiene, and minimizing exposure to people who live outside of the home, especially if they are exhibiting any respiratory, GI, or illness-like symptoms.

## 2024-06-24 ENCOUNTER — Encounter: Attending: Surgery | Admitting: Dietician

## 2024-06-24 ENCOUNTER — Encounter: Payer: Self-pay | Admitting: Dietician

## 2024-06-24 VITALS — Ht 62.5 in | Wt 225.1 lb

## 2024-06-24 DIAGNOSIS — E669 Obesity, unspecified: Secondary | ICD-10-CM | POA: Diagnosis not present

## 2024-06-24 NOTE — Progress Notes (Signed)
 2 Week Post-Operative Nutrition Class   Class start Time: 1515   Class End Time: 1630  This was a class of 2 patients.   Patient was seen on 06/24/2024 for Post-Operative Nutrition education at the [facility].    Surgery date: 06/09/2024 Surgery type: Sleeve Gastrectomy  Anthropometrics  Start weight at [facility]: 234 lbs (date: 01/30/2024)  Height: 62.5 in Weight today: 225.1 pounds   Clinical  Medical hx: GERD, OSA, hyperlipidemia, HTN, arthritis, allergies, glaucoma Medications: see list  Labs: WNL Notable signs/symptoms: Food feeling like it gets stuck  Bowel Habits: Every day to every other day no complaints   Body Composition Scale 06/24/2024  Current Body Weight 225.1  Total Body Fat % 44.8  Visceral Fat 16  Fat-Free Mass % 55.1   Total Body Water  % 42.0  Muscle-Mass lbs 29.1  BMI 40.5  Body Fat Displacement          Torso  lbs 62.5         Left Leg  lbs 12.5         Right Leg  lbs 12.5         Left Arm  lbs 6.2         Right Arm  lbs 6.2    The following the learning objectives were met by the patient during this course: Identifies Soft Prepped Plan Advancement Guide  Identifies Soft, High Proteins (Phase 1), beginning 2 weeks post-operatively to 3 weeks post-operatively Identifies Additional Soft High Proteins, soft non-starchy vegetables, fruits and starches (Phase 2), beginning 3 weeks post-operatively to 3 months post-operatively Identifies appropriate sources of fluids, proteins, vegetables, fruits and starches Identifies appropriate fat sources and healthy verses unhealthy fat types   States protein, vegetable, fruit and starch recommendations and appropriate sources post-operatively Identifies the need for appropriate texture modifications, mastication, and bite sizes when consuming solids Identifies appropriate fat consumption and sources Identifies appropriate multivitamin and calcium  sources post-operatively Describes the  need for physical activity post-operatively and will follow MD recommendations States when to call healthcare provider regarding medication questions or post-operative complications   Handouts given during class include: Soft Prepped Plan Advancement Guide   Follow-Up Plan: Patient will follow-up at [facility] in 10 weeks for 3 month post-op nutrition visit for diet advancement per MD.

## 2024-07-04 ENCOUNTER — Telehealth: Payer: Self-pay | Admitting: Dietician

## 2024-07-04 NOTE — Telephone Encounter (Signed)
 RD called pt to verify fluid intake once starting soft, solid proteins 2 week post-bariatric surgery.   Daily Fluid intake:  Daily Protein intake:  Bowel Habits:   Concerns/issues:   Left Voice Message with call back number

## 2024-07-10 ENCOUNTER — Ambulatory Visit: Admitting: Family Medicine

## 2024-07-12 ENCOUNTER — Other Ambulatory Visit: Payer: Self-pay | Admitting: Medical-Surgical

## 2024-07-12 DIAGNOSIS — I10 Essential (primary) hypertension: Secondary | ICD-10-CM

## 2024-07-13 ENCOUNTER — Other Ambulatory Visit (HOSPITAL_COMMUNITY): Payer: Self-pay

## 2024-07-14 ENCOUNTER — Other Ambulatory Visit (HOSPITAL_COMMUNITY): Payer: Self-pay

## 2024-07-14 MED ORDER — ASPIRIN 81 MG PO TBEC
81.0000 mg | DELAYED_RELEASE_TABLET | Freq: Every day | ORAL | 0 refills | Status: AC
  Filled 2024-07-14 – 2024-07-15 (×2): qty 90, 90d supply, fill #0

## 2024-07-14 MED ORDER — METOPROLOL SUCCINATE ER 25 MG PO TB24
25.0000 mg | ORAL_TABLET | Freq: Every day | ORAL | 0 refills | Status: AC
  Filled 2024-07-14: qty 90, 90d supply, fill #0

## 2024-07-15 ENCOUNTER — Other Ambulatory Visit: Payer: Self-pay

## 2024-07-15 ENCOUNTER — Other Ambulatory Visit (HOSPITAL_COMMUNITY): Payer: Self-pay

## 2024-09-04 ENCOUNTER — Other Ambulatory Visit: Payer: Self-pay

## 2024-09-04 ENCOUNTER — Other Ambulatory Visit: Payer: Self-pay | Admitting: Medical-Surgical

## 2024-09-04 ENCOUNTER — Other Ambulatory Visit (HOSPITAL_COMMUNITY): Payer: Self-pay

## 2024-09-04 DIAGNOSIS — I10 Essential (primary) hypertension: Secondary | ICD-10-CM

## 2024-09-05 ENCOUNTER — Encounter: Payer: Self-pay | Admitting: Medical-Surgical

## 2024-09-05 ENCOUNTER — Other Ambulatory Visit (HOSPITAL_COMMUNITY): Payer: Self-pay

## 2024-09-05 ENCOUNTER — Other Ambulatory Visit: Payer: Self-pay

## 2024-09-05 ENCOUNTER — Ambulatory Visit: Admitting: Medical-Surgical

## 2024-09-05 VITALS — BP 117/72 | HR 57 | Resp 20 | Ht 62.5 in | Wt 207.1 lb

## 2024-09-05 DIAGNOSIS — Z6837 Body mass index (BMI) 37.0-37.9, adult: Secondary | ICD-10-CM | POA: Insufficient documentation

## 2024-09-05 DIAGNOSIS — Z0000 Encounter for general adult medical examination without abnormal findings: Secondary | ICD-10-CM

## 2024-09-05 DIAGNOSIS — I10 Essential (primary) hypertension: Secondary | ICD-10-CM

## 2024-09-05 DIAGNOSIS — E782 Mixed hyperlipidemia: Secondary | ICD-10-CM

## 2024-09-05 DIAGNOSIS — Z23 Encounter for immunization: Secondary | ICD-10-CM

## 2024-09-05 DIAGNOSIS — L600 Ingrowing nail: Secondary | ICD-10-CM

## 2024-09-05 DIAGNOSIS — M170 Bilateral primary osteoarthritis of knee: Secondary | ICD-10-CM

## 2024-09-05 DIAGNOSIS — R7303 Prediabetes: Secondary | ICD-10-CM

## 2024-09-05 DIAGNOSIS — Z1231 Encounter for screening mammogram for malignant neoplasm of breast: Secondary | ICD-10-CM

## 2024-09-05 MED ORDER — PANTOPRAZOLE SODIUM 40 MG PO TBEC
40.0000 mg | DELAYED_RELEASE_TABLET | Freq: Every day | ORAL | 0 refills | Status: AC
  Filled 2024-09-05: qty 90, 90d supply, fill #0

## 2024-09-05 MED ORDER — VALSARTAN-HYDROCHLOROTHIAZIDE 320-25 MG PO TABS
1.0000 | ORAL_TABLET | Freq: Every day | ORAL | 3 refills | Status: AC
  Filled 2024-09-05: qty 90, 90d supply, fill #0

## 2024-09-05 MED ORDER — ATORVASTATIN CALCIUM 10 MG PO TABS
10.0000 mg | ORAL_TABLET | Freq: Every day | ORAL | 3 refills | Status: AC
  Filled 2024-09-05: qty 90, 90d supply, fill #0

## 2024-09-05 MED ORDER — CETIRIZINE HCL 10 MG PO TABS
10.0000 mg | ORAL_TABLET | Freq: Every day | ORAL | 3 refills | Status: AC
  Filled 2024-09-05: qty 90, 90d supply, fill #0

## 2024-09-05 NOTE — Progress Notes (Signed)
 "  Complete physical exam  Patient: Amy Diaz   DOB: [birth year]   54 y.o. Female  MRN: [id number]  Subjective:    Chief Complaint  Patient presents with   Annual Exam    Amy Diaz is a 54 y.o. female who presents today for a complete physical exam. She reports consuming a calorie deficit with high protein diet. The patient does not participate in regular exercise at present. She generally feels well. She reports sleeping fairly well. She does not have additional problems to discuss today.    Most recent fall risk assessment:    05/08/2024    2:31 PM  Fall Risk   Falls in the past year? 0  Number falls in past yr: 0  Injury with Fall? 0   Risk for fall due to : No Fall Risks  Follow up Falls evaluation completed     Data saved with a previous flowsheet row definition     Most recent depression screenings:    09/05/2024   10:48 AM 05/08/2024    2:31 PM  PHQ 2/9 Scores  PHQ - 2 Score 0 0  PHQ- 9 Score  5      Data saved with a previous flowsheet row definition    Vision:Within last year and Dental: No current dental problems and Receives regular dental care    Patient Care Team: Willo Mini, NP as PCP - General (Nurse Practitioner)   Show/hide medication list[1]  Review of Systems  Constitutional:  Negative for chills, fever, malaise/fatigue and weight loss.  HENT:  Negative for congestion, ear pain, hearing loss, sinus pain and sore throat.   Eyes:  Negative for blurred vision, photophobia and pain.  Respiratory:  Negative for cough, shortness of breath and wheezing.   Cardiovascular:  Negative for chest pain, palpitations and leg swelling.  Gastrointestinal:  Positive for heartburn. Negative for abdominal pain, constipation, diarrhea, nausea and vomiting.  Genitourinary:  Negative for dysuria, frequency and urgency.  Musculoskeletal:  Positive for joint pain (bilateral knee pain). Negative for falls and neck pain.       Bilateral great toe pain.    Skin:   Negative for itching and rash.  Neurological:  Negative for dizziness, weakness and headaches.  Endo/Heme/Allergies:  Negative for polydipsia. Does not bruise/bleed easily.  Psychiatric/Behavioral:  Negative for depression, substance abuse and suicidal ideas. The patient has insomnia. The patient is not nervous/anxious.      Objective:    BP 117/72 (BP Location: Right Arm, Cuff Size: Normal)   Pulse (!) 57   Resp 20   Ht 5' 2.5 (1.588 m)   Wt 207 lb 1.3 oz (93.9 kg)   SpO2 99%   BMI 37.27 kg/m    Physical Exam Vitals reviewed.  Constitutional:      General: She is not in acute distress.    Appearance: Normal appearance. She is obese. She is not ill-appearing.  HENT:     Head: Normocephalic and atraumatic.     Right Ear: Tympanic membrane, ear canal and external ear normal. There is no impacted cerumen.     Left Ear: Tympanic membrane, ear canal and external ear normal. There is no impacted cerumen.     Nose: Nose normal. No congestion or rhinorrhea.     Mouth/Throat:     Mouth: Mucous membranes are moist.     Pharynx: No oropharyngeal exudate or posterior oropharyngeal erythema.  Eyes:     General: No scleral icterus.  Right eye: No discharge.        Left eye: No discharge.     Extraocular Movements: Extraocular movements intact.     Conjunctiva/sclera: Conjunctivae normal.     Pupils: Pupils are equal, round, and reactive to light.  Neck:     Thyroid : No thyromegaly.     Vascular: No carotid bruit or JVD.     Trachea: Trachea normal.  Cardiovascular:     Rate and Rhythm: Normal rate and regular rhythm.     Pulses: Normal pulses.     Heart sounds: Normal heart sounds. No murmur heard.    No friction rub. No gallop.  Pulmonary:     Effort: Pulmonary effort is normal. No respiratory distress.     Breath sounds: Normal breath sounds. No wheezing.  Abdominal:     General: Bowel sounds are normal. There is no distension.     Palpations: Abdomen is soft.      Tenderness: There is no abdominal tenderness. There is no guarding.  Musculoskeletal:        General: Normal range of motion.     Cervical back: Normal range of motion and neck supple.  Lymphadenopathy:     Cervical: No cervical adenopathy.  Skin:    General: Skin is warm and dry.  Neurological:     Mental Status: She is alert and oriented to person, place, and time.     Cranial Nerves: No cranial nerve deficit.  Psychiatric:        Mood and Affect: Mood normal.        Behavior: Behavior normal.        Thought Content: Thought content normal.        Judgment: Judgment normal.      No results found for any visits on 09/05/24.     Assessment & Plan:    Routine Health Maintenance and Physical Exam  Immunization History  Administered Date(s) Administered   Influenza,inj,Quad PF,6+ Mos 04/13/2020, 05/11/2022   Influenza-Unspecified 05/01/2019, 04/30/2023, 04/29/2024   PFIZER(Purple Top)SARS-COV-2 Vaccination 10/26/2019, 11/17/2019, 08/04/2020   PNEUMOCOCCAL CONJUGATE-20 09/05/2024   Tdap 03/09/2011, 09/01/2019   Zoster Recombinant(Shingrix ) 10/31/2021, 02/07/2022    Health Maintenance  Topic Date Due   HIV Screening  Never done   Hepatitis B Vaccines 19-59 Average Risk (1 of 3 - 19+ 3-dose series) Never done   Mammogram  07/14/2023   COVID-19 Vaccine (4 - 2025-26 season) 09/21/2024 (Originally 03/31/2024)   Hepatitis C Screening  11/01/2026 (Originally [birth year]1987)   DTaP/Tdap/Td (3 - Td or Tdap) 08/31/2029   Colonoscopy  03/25/2034   Pneumococcal Vaccine: 50+ Years  Completed   Influenza Vaccine  Completed   HPV VACCINES (No Doses Required) Completed   Zoster Vaccines- Shingrix   Completed   Meningococcal B Vaccine  Aged Out    Discussed health benefits of physical activity, and encouraged her to engage in regular exercise appropriate for her age and condition.  1. Annual physical exam (Primary) Checking labs as below. UTD on preventative care. Wellness information  provided with [facility]S. - CBC with Differential/Platelet - Comprehensive metabolic panel with GFR - Lipid panel  2. Essential hypertension Blood pressure looks beautiful has at goal today.  Continue metoprolol  25 mg daily.  Continue valsartan -HCTZ 320-25 mg daily.  3. Mixed hyperlipidemia Updating labs today.  Continue atorvastatin  as prescribed. - atorvastatin  (LIPITOR) 10 MG tablet; Take 1 tablet (10 mg total) by mouth daily.  Dispense: 90 tablet; Refill: 3  4. Class 2 severe obesity due to excess  calories with serious comorbidity and body mass index (BMI) of 37.0 to 37.9 in adult Status post gastric sleeve and doing well overall.  Continue regular intentional exercise and dietary modifications as instructed by bariatrics.  5. Encounter for screening mammogram for malignant neoplasm of breast Mammogram ordered. - MM DIGITAL SCREENING BILATERAL; Future  6. Prediabetes Checking hemoglobin A1c. - Hemoglobin A1c  7. Ingrown toenail Has trouble with bilateral great toe pain and suspects ingrown toenails.  Referring to podiatry. - Ambulatory referral to Podiatry  8. Primary osteoarthritis of both knees Previously seen by Dr. Curtis with sports medicine but he is no longer with our practice.  She would like a referral to his new practice so placing today. - Ambulatory referral to Orthopedics  9. Need for pneumococcal 20-valent conjugate vaccination Prevnar 20 given in office today. - Pneumococcal conjugate vaccine 20-valent (Prevnar 20)   Return in about 6 months (around 03/05/2025) for chronic disease follow up.     Belvin Gauss, NP      [1]  Outpatient Medications Prior to Visit  Medication Sig   AMBULATORY NON FORMULARY MEDICATION Continuous positive airway pressure (CPAP) machine set on AutoPAP (4-20 cmH2O), with all supplemental supplies as needed.   aspirin  EC (ASPIRIN  LOW DOSE) 81 MG tablet Take 1 tablet (81 mg total) by mouth daily. Swallow whole.   BIOTIN PO Take 1  tablet by mouth daily.   Brimonidine  Tartrate (LUMIFY ) 0.025 % SOLN Place 1 drop into both eyes in the morning.   Ca Phosphate-Cholecalciferol (CALCIUM  500 + D3) 250-12.5 MG-MCG CHEW    diclofenac  Sodium (VOLTAREN ) 1 % GEL Apply 4 g topically 4 (four) times daily. Apply to affected areas 4 times daily as needed for pain. (Patient taking differently: Apply 4 g topically daily as needed (pain).)   famotidine  (PEPCID ) 40 MG tablet Take 1 tablet (40 mg total) by mouth at bedtime. (Patient taking differently: Take 40 mg by mouth daily as needed.)   Fluocinolone  Acetonide Body 0.01 % OIL Apply 1 application  topically daily. (Patient taking differently: Apply 1 application  topically daily as needed (dry skin).)   methocarbamol  (ROBAXIN ) 500 MG tablet Take 1 tablet (500 mg total) by mouth 3 (three) times daily.   metoprolol  succinate (TOPROL -XL) 25 MG 24 hr tablet Take 1 tablet (25 mg total) by mouth daily.   Multiple Vitamins-Minerals (BARIATRIC MULTIVITAMINS PO) Take 1 tablet by mouth daily.   Propylene Glycol (SYSTANE COMPLETE) 0.6 % SOLN Place 1 drop into both eyes daily as needed (dry eyes).   VITAMIN D  PO Take 4,000 Units by mouth in the morning.   [DISCONTINUED] pantoprazole  (PROTONIX ) 40 MG tablet Take 1 tablet (40 mg total) by mouth daily.   [DISCONTINUED] atorvastatin  (LIPITOR) 10 MG tablet Take 1 tablet (10 mg total) by mouth daily.   [DISCONTINUED] cetirizine  (ZYRTEC ) 10 MG tablet Take 1 tablet (10 mg total) by mouth daily.   [DISCONTINUED] enoxaparin  (LOVENOX ) 40 MG/0.4ML injection Inject 0.4 mLs (40 mg total) into the skin every 12 (twelve) hours for 14 days.   [DISCONTINUED] gabapentin  (NEURONTIN ) 100 MG capsule Take 1 capsule (100 mg total) by mouth every 12 (twelve) hours for 5 days.   [DISCONTINUED] ondansetron  (ZOFRAN -ODT) 4 MG disintegrating tablet Take 1 tablet (4 mg total) by mouth every 6 (six) hours as needed for nausea or vomiting.   [DISCONTINUED] oxyCODONE  (OXY IR/ROXICODONE ) 5  MG immediate release tablet Take 1 tablet (5 mg total) by mouth every 6 (six) hours as needed for breakthrough pain  or severe pain (pain score 7-10).   [DISCONTINUED] thiamine (VITAMIN B1) 100 MG tablet Take 100 mg by mouth daily.   No facility-administered medications prior to visit.   "

## 2024-09-05 NOTE — Patient Instructions (Signed)
 GERD in Adults: Diet Changes When you have gastroesophageal reflux disease (GERD), you may need to make changes to your diet. Choosing the right foods can help with your symptoms. Think about working with an expert in healthy eating called a dietitian. They can help you make healthy food choices. What are tips for following this plan? Reading food labels Look for foods that are low in saturated fat. Foods that may help with your symptoms include: Foods with less than 5% of daily value (DV) of fat. Foods with 0 grams of trans fat. Cooking [employer] in ways that don't use a lot of fat. These ways include: Baking. Steaming. Grilling. Broiling. To add flavor, try to use herbs that are low in spice and acidity. Avoid frying your food. Meal planning  Eat small meals often rather than eating 3 large meals each day. Eat your meals slowly in a place where you feel relaxed. If told by your health care provider, avoid: Foods that cause symptoms. Keep a food diary to keep track of foods that cause symptoms. Alcohol. Drinking a lot of liquid with meals. General instructions For 2-3 hours after you eat, avoid: Bending over. Exercise. Lying down. Chew sugar-free gum after meals. What foods should I eat? Eat a healthy diet. Try to include: Foods with high amounts of fiber. These include: Fruits and vegetables. Whole grains and beans. Low-fat dairy products. Lean meats, fish, and poultry. Egg whites. Foods that cause symptoms in someone else may not cause symptoms for you. Work with your provider to find foods that are safe for you. The items listed above may not be all the foods and drinks you can have. Talk with a dietitian to learn more. The items listed above may not be a complete list of foods and beverages you can eat and drink. Contact a dietitian for more information. What foods should I avoid? Limiting some of these foods may help with your symptoms. Each person is different.  Talk with a dietitian or your provider to help you find the exact foods to avoid. Some of the foods to avoid may include: Fruits Fruits with a lot of acid in them. These may include citrus fruits, such as oranges, grapefruit, pineapple, and lemons. Vegetables Deep-fried vegetables, such as [place] fries. Vegetables, sauces, or toppings made with added fat and vegetables with acid in them. These may include tomatoes and tomato products, chili peppers, onions, garlic, and horseradish. Grains Pastries or quick breads with added fat. Meats and other proteins High-fat meats, such as fatty beef or pork, hot dogs, ribs, ham, sausage, salami, and bacon. Fried meat or protein, such as fried fish and fried chicken. Egg yolks. Fats and oils Butter. Margarine. Shortening. Ghee. Drinks Coffee and other drinks with caffeine in them. Fizzy and sugary drinks, such as soda and energy drinks. Fruit juice made with acidic fruits, such as orange or grapefruit. Tomato juice. Sweets and desserts Chocolate and cocoa. Donuts. Seasonings and condiments Mint, such as peppermint and spearmint. Condiments, herbs, or seasonings that cause symptoms. These may include curry, hot sauce, or vinegar-based salad dressings. The items listed above may not be all the foods and drinks you should avoid. Talk with a dietitian to learn more. Questions to ask your health care provider Changes to your diet and everyday life are often the first steps taken to manage symptoms of GERD. If these changes don't help, talk with your provider about taking medicines. Where to find more information [employer]:  aboutgerd.org This information is not intended to replace advice given to you by your health care provider. Make sure you discuss any questions you have with your health care provider. Document Revised: 05/29/2023 Document Reviewed: 12/13/2022 Elsevier Patient Education  2024 [employer].

## 2024-09-09 ENCOUNTER — Ambulatory Visit: Admitting: Dietician

## 2025-03-05 ENCOUNTER — Ambulatory Visit: Admitting: Medical-Surgical
